# Patient Record
Sex: Male | Born: 1935 | Race: White | Hispanic: No | Smoking: Never smoker
Health system: Southern US, Community
[De-identification: ages and names within clinical notes are randomized; demographics above are authoritative.]

## PROBLEM LIST (undated history)

## (undated) DIAGNOSIS — C61 Malignant neoplasm of prostate: Secondary | ICD-10-CM

## (undated) DIAGNOSIS — R0989 Other specified symptoms and signs involving the circulatory and respiratory systems: Secondary | ICD-10-CM

## (undated) DIAGNOSIS — H9190 Unspecified hearing loss, unspecified ear: Secondary | ICD-10-CM

## (undated) DIAGNOSIS — I779 Disorder of arteries and arterioles, unspecified: Secondary | ICD-10-CM

## (undated) DIAGNOSIS — I10 Essential (primary) hypertension: Secondary | ICD-10-CM

## (undated) DIAGNOSIS — Z951 Presence of aortocoronary bypass graft: Secondary | ICD-10-CM

## (undated) DIAGNOSIS — N201 Calculus of ureter: Secondary | ICD-10-CM

## (undated) DIAGNOSIS — Z85038 Personal history of other malignant neoplasm of large intestine: Secondary | ICD-10-CM

## (undated) DIAGNOSIS — D563 Thalassemia minor: Secondary | ICD-10-CM

## (undated) DIAGNOSIS — N189 Chronic kidney disease, unspecified: Secondary | ICD-10-CM

## (undated) DIAGNOSIS — E785 Hyperlipidemia, unspecified: Secondary | ICD-10-CM

## (undated) DIAGNOSIS — R413 Other amnesia: Secondary | ICD-10-CM

## (undated) DIAGNOSIS — N185 Chronic kidney disease, stage 5: Secondary | ICD-10-CM

## (undated) DIAGNOSIS — Z789 Other specified health status: Secondary | ICD-10-CM

## (undated) DIAGNOSIS — R809 Proteinuria, unspecified: Secondary | ICD-10-CM

## (undated) DIAGNOSIS — C189 Malignant neoplasm of colon, unspecified: Secondary | ICD-10-CM

## (undated) DIAGNOSIS — E872 Acidosis: Secondary | ICD-10-CM

## (undated) DIAGNOSIS — E782 Mixed hyperlipidemia: Secondary | ICD-10-CM

## (undated) DIAGNOSIS — I739 Peripheral vascular disease, unspecified: Secondary | ICD-10-CM

## (undated) DIAGNOSIS — R55 Syncope and collapse: Secondary | ICD-10-CM

## (undated) DIAGNOSIS — N2581 Secondary hyperparathyroidism of renal origin: Secondary | ICD-10-CM

## (undated) DIAGNOSIS — I2510 Atherosclerotic heart disease of native coronary artery without angina pectoris: Secondary | ICD-10-CM

## (undated) DIAGNOSIS — E213 Hyperparathyroidism, unspecified: Secondary | ICD-10-CM

## (undated) DIAGNOSIS — Z8679 Personal history of other diseases of the circulatory system: Secondary | ICD-10-CM

## (undated) DIAGNOSIS — D631 Anemia in chronic kidney disease: Secondary | ICD-10-CM

## (undated) DIAGNOSIS — R42 Dizziness and giddiness: Secondary | ICD-10-CM

## (undated) DIAGNOSIS — I5189 Other ill-defined heart diseases: Secondary | ICD-10-CM

## (undated) DIAGNOSIS — I6529 Occlusion and stenosis of unspecified carotid artery: Secondary | ICD-10-CM

## (undated) DIAGNOSIS — Z9181 History of falling: Secondary | ICD-10-CM

## (undated) DIAGNOSIS — R29898 Other symptoms and signs involving the musculoskeletal system: Secondary | ICD-10-CM

## (undated) DIAGNOSIS — Z955 Presence of coronary angioplasty implant and graft: Secondary | ICD-10-CM

## (undated) DIAGNOSIS — I6930 Unspecified sequelae of cerebral infarction: Secondary | ICD-10-CM

## (undated) DIAGNOSIS — N184 Chronic kidney disease, stage 4 (severe): Secondary | ICD-10-CM

## (undated) HISTORY — PX: BRAIN HEMATOMA EVACUATION: SHX573

## (undated) HISTORY — PX: INGUINAL HERNIA REPAIR: SUR1180

## (undated) HISTORY — DX: Malignant neoplasm of prostate: C61

## (undated) HISTORY — PX: HEMICOLECTOMY: SHX854

## (undated) HISTORY — DX: Essential (primary) hypertension: I10

## (undated) HISTORY — DX: Syncope and collapse: R55

## (undated) HISTORY — PX: CARDIAC CATHETERIZATION: SHX172

## (undated) HISTORY — DX: Occlusion and stenosis of unspecified carotid artery: I65.29

## (undated) HISTORY — PX: CORONARY ARTERY BYPASS GRAFT: SHX141

## (undated) HISTORY — DX: Hyperparathyroidism, unspecified: E21.3

## (undated) HISTORY — PX: COLONOSCOPY: SHX174

## (undated) HISTORY — DX: Chronic kidney disease, unspecified: N18.9

## (undated) HISTORY — DX: Atherosclerotic heart disease of native coronary artery without angina pectoris: I25.10

## (undated) HISTORY — DX: Chronic kidney disease, stage 4 (severe): N18.4

## (undated) HISTORY — DX: Hyperlipidemia, unspecified: E78.5

## (undated) HISTORY — DX: Acidosis: E87.2

## (undated) HISTORY — PX: CORONARY ANGIOPLASTY WITH STENT PLACEMENT: SHX49

## (undated) HISTORY — DX: Other specified symptoms and signs involving the circulatory and respiratory systems: R09.89

## (undated) HISTORY — DX: Malignant neoplasm of colon, unspecified: C18.9

## (undated) HISTORY — DX: Other ill-defined heart diseases: I51.89

## (undated) HISTORY — DX: Proteinuria, unspecified: R80.9

---

## 2001-08-30 ENCOUNTER — Encounter: Payer: Self-pay | Admitting: General Surgery

## 2001-09-04 ENCOUNTER — Ambulatory Visit (HOSPITAL_COMMUNITY): Admission: RE | Admit: 2001-09-04 | Discharge: 2001-09-04 | Payer: Self-pay | Admitting: General Surgery

## 2006-12-19 HISTORY — PX: PARTIAL NEPHRECTOMY: SHX414

## 2006-12-19 HISTORY — PX: COLON SURGERY: SHX602

## 2006-12-19 HISTORY — PX: OTHER SURGICAL HISTORY: SHX169

## 2007-03-20 ENCOUNTER — Ambulatory Visit: Payer: Self-pay | Admitting: Oncology

## 2007-03-29 ENCOUNTER — Ambulatory Visit: Payer: Self-pay | Admitting: Gastroenterology

## 2007-04-02 ENCOUNTER — Ambulatory Visit: Payer: Self-pay | Admitting: Oncology

## 2007-04-19 ENCOUNTER — Ambulatory Visit: Payer: Self-pay | Admitting: Oncology

## 2007-07-07 ENCOUNTER — Ambulatory Visit: Payer: Self-pay

## 2007-07-17 ENCOUNTER — Emergency Department: Payer: Self-pay | Admitting: Emergency Medicine

## 2007-08-20 ENCOUNTER — Other Ambulatory Visit: Payer: Self-pay

## 2007-08-21 ENCOUNTER — Inpatient Hospital Stay: Payer: Self-pay | Admitting: Internal Medicine

## 2008-04-16 ENCOUNTER — Ambulatory Visit: Payer: Self-pay

## 2008-06-18 ENCOUNTER — Ambulatory Visit: Payer: Self-pay | Admitting: Unknown Physician Specialty

## 2008-06-30 ENCOUNTER — Emergency Department: Payer: Self-pay | Admitting: Emergency Medicine

## 2008-11-19 ENCOUNTER — Inpatient Hospital Stay (HOSPITAL_COMMUNITY): Admission: AD | Admit: 2008-11-19 | Discharge: 2008-11-28 | Payer: Self-pay | Admitting: Cardiology

## 2008-11-20 HISTORY — PX: CARDIAC CATHETERIZATION: SHX172

## 2008-11-21 ENCOUNTER — Encounter: Payer: Self-pay | Admitting: Cardiothoracic Surgery

## 2008-11-21 ENCOUNTER — Ambulatory Visit: Payer: Self-pay | Admitting: Oncology

## 2008-11-23 ENCOUNTER — Encounter: Payer: Self-pay | Admitting: Cardiothoracic Surgery

## 2008-11-24 HISTORY — PX: CORONARY ARTERY BYPASS GRAFT: SHX141

## 2008-11-27 ENCOUNTER — Ambulatory Visit: Payer: Self-pay | Admitting: Cardiothoracic Surgery

## 2008-12-25 ENCOUNTER — Ambulatory Visit: Payer: Self-pay | Admitting: Cardiothoracic Surgery

## 2008-12-25 ENCOUNTER — Encounter: Admission: RE | Admit: 2008-12-25 | Discharge: 2008-12-25 | Payer: Self-pay | Admitting: Cardiothoracic Surgery

## 2009-01-15 ENCOUNTER — Encounter: Payer: Self-pay | Admitting: Cardiovascular Disease

## 2009-01-19 ENCOUNTER — Encounter: Payer: Self-pay | Admitting: Cardiovascular Disease

## 2009-02-16 ENCOUNTER — Encounter: Payer: Self-pay | Admitting: Cardiovascular Disease

## 2009-02-27 ENCOUNTER — Emergency Department: Payer: Self-pay | Admitting: Internal Medicine

## 2009-07-02 ENCOUNTER — Ambulatory Visit: Payer: Self-pay | Admitting: Unknown Physician Specialty

## 2009-12-01 ENCOUNTER — Ambulatory Visit: Payer: Self-pay | Admitting: Vascular Surgery

## 2010-06-30 ENCOUNTER — Ambulatory Visit: Payer: Self-pay

## 2011-05-03 NOTE — Assessment & Plan Note (Signed)
OFFICE VISIT   Jake Garcia, Jake Garcia  DOB:  Jan 03, 1929                                        December 25, 2008  CHART #:  [id number]   The patient returns to the office today for followup visit following off-  pump coronary artery bypass grafting x1 with left internal mammary to  the left anterior descending coronary artery.  He had originally  presented with positive stress test and cardiac catheterization with  complex LAD lesion.  After consideration, it was felt not to be suitable  for angioplasty and the patient was referred for coronary artery bypass  grafting which was carried out 11/24/2008.  The patient did well  postoperatively, returns today for a followup visit.  He notes that  overall his strength is returning.  He has increased in his physical  activity appropriately.  He has had no recurrent angina.  He has noted  some mild blurred vision that resolves, that has not been permanent.  He  denies any definite amaurosis and notes that when he puts his glasses  on, it is improved.   PHYSICAL EXAMINATION:  VITAL SIGNS:  His blood pressure 153/78, pulse  76, respiratory rate is 18, and O2 sats 97%.  CHEST:  His sternum is stable and well healed.  He has no pedal edema.  His lungs are clear bilaterally.   CURRENT MEDICATIONS:  1. Aspirin 325 mg a day.  2. Norvasc 5 mg a day.  3. Crestor 20 mg a day.  4. Lopressor 25 mg p.o. b.i.d.   Followup chest x-ray shows clear lung fields bilaterally.   Overall, the patient is making good progress postoperatively.  I have  been encouraged him to enroll in the cardiac rehab program in [facility], as this is close to his home which he is  willing to do.  At this point, he has no gross visual field cut and his  visual symptoms seem to be very transient, but not classic for  amaurosis.  He will make appointment for followup with his  ophthalmologist.  I have not made him a return appointment to  see me as  he is followed by Dr. Rockey Situ in [place]  office.   It should be noted that the patient's preoperative Doppler studies did  have some vascular disease in the right 60-79%, but closer to the 60%  range.  This could be called up through Dr. Rockey Situ in the [place]  office.   Lanelle Bal, MD  Electronically Signed   EG/MEDQ  D:  12/25/2008  T:  12/25/2008  Job:  [id number]   cc:   Minna Merritts, MD

## 2011-05-03 NOTE — Consult Note (Signed)
NAME:  Jake Garcia, Jake Garcia                 ACCOUNT NO.:  [id number]   MEDICAL RECORD NO.:  [id number]          PATIENT TYPE:  INP   LOCATION:                               FACILITY:  [facility]   PHYSICIAN:  Sherryl Manges, MD            DATE OF BIRTH:  [birth year]   DATE OF CONSULTATION:  11/21/2008  DATE OF DISCHARGE:                                 CONSULTATION   Room number 473C, bed 1.   REASON FOR CONSULTATION:  Anemia.   REFERRING PHYSICIAN:  Eden Lathe. Einar Gip, MD   HISTORY OF PRESENT ILLNESS:  Jake Garcia is a delightful 77 year old  white male,  asked to see for evaluation of possible thalassemia.  The  patient carries a diagnosis of stage I colon cancer, status post left  hemicolectomy and s/p partial right nephrectomry for a benign renal  lesion in 2008.  He is followed at [facility].  All the  records have been requested, and they are currently pending.  His son, a  Pension scheme manager, at [facility], states that no radiation or  chemotherapy was received.  The patient presented to Dr. [name] office  with anginal symptoms which required further evaluation in hospital.  He  was admitted for left cardiac catheterization, which was remarkable for  proximal to mid LAD stenosis.  He will require a single-vessel CABG  using lateral thoracotomy - off-pump approach.  While obtaining his  history, he mentioned that 1 of his children has been diagnosed with  thalassemia for which we were asked to see him in consultation prior to  his surgery.  The patient is of [place] descent with family  members from [place], and [place].  He denies any fatigue  or appetite changes.  No bleeding problems.  His main complaint is  dyspnea on exertion, which resolved when the activities discontinued,  accompanied by chest pain when this exertion takes place.  Otherwise, he  denies any fever or chills.  No night sweats or headaches.  No  significant fatigue.  He denies any GI or GU  complaints at this time.  He denies any bleeding in his stools or in his urine.  No neuro changes.  No weight loss.  The peripheral blood smear has been ordered, and he is  ready for review.   PAST MEDICAL HISTORY:  1. History of stage I colon cancer status post left hemicolectomy  2. Chronic renal insufficiency.  3. Hypertension.  4. Right bundle branch block.  5. Hyperlipidemia.   SURGERIES:  1. Status post cardiac catheterization, Dr. Rex Kras, November 20, 2008.  2. Status post left inguinal hernia repair, September 04, 2001, Dr.      Rebekah Chesterfield.  3. Status post right inguinal hernia repair in July 2009 at [facility].  4. Status post partial nephrectomy in the year 2008 with benign      pathology per patient's report.  5. Status post left hemicolectomy on Apr 27, 2007, at [facility].   MEDICATIONS:  1. A 2b-3a protocol as directed.  2. Norvasc 5 mg  p.o. daily.  3. Aspirin 325 mg p.o. daily.  4. Coreg 3.125 mg b.i.d.  5. Valium 5 mg p.o. p.r.n.  6. Integrilin 75 mg IV x1 drip.  7. Crestor 20 mg nightly.  8. Tylenol p.r.n.  9. Xanax 0.25 mg b.i.d. p.r.n.   The following are p.r.n. medications, Robitussin, Zofran, Imodium,  Percocet, Maalox Tucks, morphine sulfate, and Ambien.   REVIEW OF SYSTEMS:  See HPI for significant positives.  The rest of the  review of systems is essentially negative.   FAMILY HISTORY:  Mother died with heart disease in her 10s.  Father died  in his [id number] with complications after a fracture.  He also had a prostate  disease, but per the patient report he cannot tell if he did have benign  or malignant prostatic disease.  One sister died with liver cancer, 1  died with mad cow disease.  Another sister died while on surgery, as she  was undergoing a biopsy for a cervical lymph node of unknown etiology.  No brothers.   SOCIAL HISTORY:  The patient is married.  He has 5 children, 1 with  thalassemia, his daughter.  The other children have not been tested.   No  tobacco or alcohol history.  The patient lives in [place].  Originally,  they are from [place].  Self employed.   HEALTH MAINTENANCE:  His last colonoscopy was in May 2009 at [place],  Dr. Vira Agar.  His last flu and Pneumovax were administered on November 20, 2008.  He is up to date with his physical exam.   PHYSICAL EXAMINATION:  GENERAL:  This is a well-developed, well-  nourished 77 year old white male, in no acute distress, alert and  oriented x3.  VITAL SIGNS:  Blood pressure 130/62, pulse 64, respirations 20,  temperature 97.9, pulse oximetry 96% in room air, weight 78 kg, height  68 inches.  HEENT:  Normocephalic, atraumatic.  PERRLA.  Oral cavity without lesions  or thrush.  NECK:  Supple.  No cervical or supraclavicular masses.  LUNGS:  Clear to auscultation bilaterally.  No axillary masses.  CARDIOVASCULAR:  Regular rate and rhythm with [id number] systolic murmur,  best heard in the left sternal border towards the axillary area.  No  rubs or gallops.  ABDOMEN:  Soft, nontender.  Bowel sounds x4.  No hepatosplenomegaly.  There is a presence of well-healed abdominal scar.  EXTREMITIES:  No clubbing or cyanosis.  No edema.  No inguinal masses.  SKIN:  Without bruising or petechial rash.  MUSCULOSKELETAL:  Without spinal tenderness.  NEUROLOGIC:  Nonfocal.   LABORATORY DATA:  Hemoglobin 9, hematocrit 28.6, white count 7.7,  platelets 204, MCV 65.1, was 66 on admission.  The differential is not  available at this time.  PTT is 30.  There is no PT or INR information.  Sodium 139; potassium 3.9; BUN 22; creatinine 1.93, was 1.75 on  admission; glucose 93.  Total bilirubin 0.8, alkaline phosphatase 64,  AST 19, ALT 14, total protein 7.3, albumin 4.2.  Calcium 9.1.  TSH  1.466.   I personally reviewed his peripheral blood smear which showed no  evidence of target cell, schistocytes, dacrocytes, agglutination,  reticulocytosis.  WBC and plt morphologies were within  normal limit.   ASSESSMENT/PLAN:  Jake Garcia is a pleasant 77 year old man now with  coronary artery disease awaiting CABG for LAD lesion.  He is  incidentally found to have normocytic anemia.   Ddx for his normocytic anemia include anemia of  chronic kidney disease.  Unlikely that there is no family history of thalessimia except for his  daughter.  If he were to have thalesimia, it would be only trait as  opposed to frank disease as he did not require blood transfuion.  Other  ddx includes plasma cell dyscrasia given his CKD and anemia.  There was  no evidence of RBC destruction on peripheral blood smear.   REC:   - No contraindication to proceeding his cardiac surgery while awaiting  his anemia workup.  - Routine anemia panel with iron study, folate, Vit B12, retic count,  - Hemoglobin electrophoresis.  - SPEP/ serum free light chains.  - 24-hour urine collection to  assess his renal function.      Sharene Butters, P.A.      Sherryl Manges, MD  Electronically Signed    SW/MEDQ  D:  11/21/2008  T:  11/21/2008  Job:  [id number]   cc:   Laurian Brim  Dr. Mirna Mires

## 2011-05-03 NOTE — Discharge Summary (Signed)
NAME:  Jake Garcia, Jake Garcia                 ACCOUNT NO.:  [id number]   MEDICAL RECORD NO.:  [id number]          PATIENT TYPE:  INP   LOCATION:  2041                         FACILITY:  [facility]   PHYSICIAN:  Lanelle Bal, MD    DATE OF BIRTH:  1929/10/12   DATE OF ADMISSION:  11/19/2008  DATE OF DISCHARGE:  11/28/2008                               DISCHARGE SUMMARY   HISTORY OF PRESENT ILLNESS:  This is a 77 year old male with a past  medical history of hypertension, hyperlipidemia, and chronic renal  insufficiency who over the past 2 weeks began experiencing chest pain  with exertion.  He first experienced a chest pain when he was walking in  the park and stated over the past 2 weeks he has had several episodes  with a chest pain that develops with exertion and then resolves when he  rests.  Associated with this chest pain was some shortness of breath and  redness in the face.  However, he denied any diaphoresis,  nauseousness, vomiting, syncope, or radiation of the chest pain.  He was  evaluated in the [facility] Cardiology office this past Monday.  Doppler studies were done.  The patient then had a stress test on  Wednesday.  Because he had worsening of the chest pain, he was admitted  for further evaluation.  He underwent a cardiac catheterization on  November 20, 2008, which revealed single-vessel coronary artery disease.  A Cardiothoracic Surgery consultation was obtained with Dr. Servando Snare and  in addition because of the patient's anemia, a Hematology consult was  obtained with Dr. Lamonte Sakai.  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  There was no contraindication to proceed with Cardiac Surgery  and the patient underwent coronary bypass grafting surgery x1 off pump  by Dr. Servando Snare on November 24, 2008.   BRIEF HOSPITAL COURSE STAY:  The patient was extubated on the evening of  surgery.  He was anemic.  He did  receive a unit of packed red blood  cells on November 25, 2008.  His creatinine was 1.73 (the patient with a  history of renal insufficiency).  Creatinine and H and H were monitored  closely over the next couple of days.  Chest tubes were removed by  November 26, 2008.  Followup chest x-ray revealed no pneumothorax.  The  patient was transferred from the Intensive Care Unit to [facility] for further  convalescence.  The patient had already been started on Lopressor.  This  had to be increased because of increased systolic blood pressure.  The  patient was progressing well with cardiac rehab.  Currently on postop  day #4, the patient is tolerating a diet.  He has had multiple bowel  movements.   PHYSICAL EXAMINATION:  VITAL SIGNS:  He is afebrile, heart rate 70-80,  BP 187/95 (previously 152/73), O2 sat 96% on room air.  CBG 163, 97, and  199.  Preop weight 81.3 kg, today's weight down to 78.3 kg.  CARDIOVASCULAR:  Regular rate and  rhythm.  PULMONARY:  Clear, diminished at the right base.  ABDOMEN:  Soft, nontender.  Bowel sounds present throughout.  EXTREMITIES:  No edema.  Sternal wound is clean, dry, and continuing to  heal.   Because of the patient's increased systolic blood pressure Norvasc 5 mg  was added (the patient on Norvasc 10 mg p.o. daily preop).  The patient  was discharged after his blood pressure did improve.  Prior to his  discharge, pacing wires were removed as well as chest tube sutures.   LATEST LABORATORY STUDIES:  BMET done today revealed the potassium of  3.8, this was supplemented prior to his discharge.  BUN and creatinine  were 23 and 2.01 respectively.  CBC done today revealed H and H to be  10.4 and 33.4 respectively, white count 9600, and platelet count  290,000.  Last chest x-ray done November 27, 2008, showed improved  aeration in left lung base, slightly increasing mid pleural effusion,  and bibasilar atelectasis.   DISCHARGE INSTRUCTIONS:  The patient is to  be on a low-fat, low-salt,  carbohydrate-modified medium caloric diet.  He is to not drive or lift  more than 10 pounds.  He is to continue with his breathing exercises  daily.  He is to walk every day and increase his frequency and duration  as he tolerates.  He may shower.  He may cleanse the wound with mild  soap and water.  He is to call the office if any wound problems arise.   FOLLOWUP APPOINTMENTS:  1. The patient is to call for follow up with [facility] Hematology/Oncology.  2. The patient has to follow up with Dr. __________ for the renal      insufficiency.  3. The patient needs to call for followup appoint with Dr. Rockey Situ for      2 weeks.  4. The patient has an appointment to see Dr. Servando Snare on December 25, 2008, at 2 p.m.  Prior to this office appointment, a chest x-ray      will be obtained.   DISCHARGE MEDICATIONS:  1. Enteric-coated aspirin 325 mg p.o. daily.  2. Norvasc 5 mg p.o. daily.  3. Crestor 20 mg p.o. daily.  4. Lopressor 25 mg p.o. 2 times daily.  5. Oxycodone 5 mg 1-2 tablets q.4-6 h. as needed for pain.      Lars Pinks, [place]      Lanelle Bal, MD  Electronically Signed    DZ/MEDQ  D:  11/28/2008  T:  11/29/2008  Job:  [id number]   cc:   Minna Merritts, MD  Cheryll Cockayne, M.D.  Eden Lathe. Einar Gip, MD

## 2011-05-03 NOTE — Cardiovascular Report (Signed)
NAME:  Jake Garcia, Jake Garcia                 ACCOUNT NO.:  [id number]   MEDICAL RECORD NO.:  [id number]          PATIENT TYPE:  INP   LOCATION:  4736                         FACILITY:  [facility]   PHYSICIAN:  Jeanella Craze. Little, M.D. DATE OF BIRTH:  May 29, 1929   DATE OF PROCEDURE:  11/20/2008  DATE OF DISCHARGE:                            CARDIAC CATHETERIZATION   This 77 year old male presented to the office for a nuclear study.  He  had ST depression in the anterior lateral leads associated with nausea.  The test was terminated.  He was admitted to the hospital and was  brought to the cath lab today.   He has been having some exertional angina dating off and on for 4 or 5  weeks.  He is pretty much in denial.   PROCEDURE:  The patient was prepped and draped in the usual sterile  fashion exposing the right groin.  Following local anesthetic with 1%  Xylocaine, the Seldinger technique was employed and a 5-French  introducer sheath was placed in the right femoral artery.  Left and  right coronary arteriography and a hand injection of the left ventricle  was performed.   COMPLICATIONS:  None.   TOTAL CONTRAST USED:  80 mL.   EQUIPMENT:  A 5-French Judkins configuration catheters.   It should be pointed out that he had a creatinine of 1.8 prior to the  procedure.  He had had a partial nephrectomy in the remote past also.   RESULTS:  Hemodynamic monitoring.  The central aortic pressure is  130/80.  His left ventricular pressure was 132/8.   Ventriculography:  Ventriculography by hand injection in the RAO  projection revealed normal LV function.  EDP was 18.   CORONARY ARTERIOGRAPHY:  1. Left main and normal, it trifurcated.  2. Circumflex.  The circumflex had no significant disease.  It gave      rise to a bifurcating OM #1 and a smaller OM #2 both of which were      free of disease.  3. Optional diagonal, moderate-sized vessel with no high-grade      stenoses.  4. LAD.  The LAD had a  complex area of stenosis that started near the      ostium of the LAD and extended down about 30 mm with the most      significant area being about 90%.  It was unclear whether this      represented an ulcerated plaque or not, but the entire segment was      diffusely diseased.  In addition to this, slightly distal to this      area was an eccentric area of 70% narrowing and in the distal      portion of the LAD was another 50% area of narrowing.  The first      diagonal was small and diffusely diseased, and not amenable to any      type of intervention.   Optional diagonal.  This was a large vessel with bifurcation that had no  significant disease.  1. Right coronary artery.  This is a very  large dominant vessel.  The      mid and distal RCA had diffuse mild irregularities.  The PDA had      mild irregularities and 2 posterior lateral vessels were free of      disease.   CONCLUSION:  1. Normal left ventricular function.  2. High-grade complex stenosis in the left anterior descending.  The      options at this point include single-vessel off-pump coronary      artery bypass grafting, but with no other disease.  He would      probably benefit from a complex multiple stent intervention to his      left anterior descending.  I have stopped at this point because of      his creatinine, we will hydrate him overnight.  Check his renal      functions in the morning.  If they are around the same, we will      proceed on with intervention.  I did start him on IV Integrilin,      but I did not start Plavix.  In case, there are any issues with the      intervention, I did not want Plavix to be a reason for him not to      be taken to the emergency room on an urgent basis if necessary.           ______________________________  Jeanella Craze Little, M.D.     ABL/MEDQ  D:  11/20/2008  T:  11/21/2008  Job:  [id number]   cc:   Minna Merritts, MD  Eden Lathe Einar Gip, MD  Catheterization Laboratory

## 2011-05-03 NOTE — Discharge Summary (Signed)
NAME:  Jake Garcia, Jake Garcia                 ACCOUNT NO.:  [id number]   MEDICAL RECORD NO.:  [id number]          PATIENT TYPE:  INP   LOCATION:  2041                         FACILITY:  [facility]   PHYSICIAN:  Eden Lathe. Einar Gip, MD       DATE OF BIRTH:  [birth year]   DATE OF ADMISSION:  11/19/2008  DATE OF DISCHARGE:  11/28/2008                               DISCHARGE SUMMARY   ADMITTING DIAGNOSES:  1. Single-vessel coronary artery disease.  2. History of hypertension.  3. History of hyperlipidemia.  4. History of chronic renal insufficiency.  5. History of colon cancer.  6. Thalassemia, B trait (asymptomatic).  7. Anemia of chronic kidney disease.   DISCHARGE DIAGNOSES:  1. Single-vessel coronary disease.  2. History of hypertension.  3. History of hyperlipidemia.  4. History of chronic renal insufficiency.  5. History of colon cancer.  6. Thalassemia, B trait (asymptomatic).  7. Anemia of chronic kidney disease.   PROCEDURES:  1. Cardiac catheterization, done on November 20, 2008, by Dr. Rex Kras,      results showed preserved left ventricular function, high-grade      complex stenosis of the left anterior descending.  2. Coronary artery bypass grafting surgery x1 (left internal mammary      artery to left anterior descending) off pump by Dr. Servando Snare on      November 24, 2008.   HISTORY OF PRESENT ILLNESS:  This is a 77 year old male with past  medical history of hypertension, hyperlipidemia, chronic renal  insufficiency who according    Dictation ended at this point.      Lars Pinks, PA      Brooks R. Einar Gip, MD  Electronically Signed    DZ/MEDQ  D:  11/28/2008  T:  11/29/2008  Job:  [id number]

## 2011-05-03 NOTE — Op Note (Signed)
Jake Garcia, Jake Garcia                 ACCOUNT NO.:  [id number]   MEDICAL RECORD NO.:  [id number]          PATIENT TYPE:  INP   LOCATION:  2303                         FACILITY:  [facility]   PHYSICIAN:  Lanelle Bal, MD    DATE OF BIRTH:  [birth year]   DATE OF PROCEDURE:  11/24/2008  DATE OF DISCHARGE:                               OPERATIVE REPORT   PROCEDURE:  Coronary artery bypass grafting off pump x1 with left  internal mammary to left anterior descending coronary artery.   PREOPERATIVE DIAGNOSIS:  Coronary occlusive disease.   POSTOPERATIVE DIAGNOSIS:  Coronary occlusive disease.   SURGEON:  Lanelle Bal, MD   FIRST ASSISTANT:  Revonda Standard. Roxan Hockey, MD.   BRIEF HISTORY:  The patient is a 77 year old male who presents with  rapidly progressing unstable anginal symptoms and a complex proximal LAD  lesion and a mid LAD lesion.  Coronary artery bypass grafting with the  mammary was recommended to the patient who agreed and signed informed  consent.   DESCRIPTION OF PROCEDURE:  The patient underwent general endotracheal  anesthesia without incident.  Skin of the chest and legs was prepped  with Betadine and draped in the usual sterile manner.  Median sternotomy  was performed.  Left internal mammary artery was dissected down as  pedicle graft.  The distal artery was divided and had good free flow.  The patient had been systemically heparinized.  The pericardium was  opened.  Overall ventricular function appeared preserved.  Using Guidant  stabilization device, the apex of the heart was elevated.  Between the  mid and distal area LAD, there was an area of calcification just distal  to this.  This vessel was of good size and quality and was selected as  the site of anastomosis.  Using the elastic tapes were placed around the  LAD proximally and distally to this area with the stabilization device  in place.  The LAD was opened.  Elastic tapes were placed for proximal  and distal  control of bleeding.  The vessel was opened using running 8-0  Prolene.  Left internal mammary artery was anastomosed to left anterior  descending coronary artery with release of the bulldog on the mammary  artery.  The blood flow was returned down the mammary artery.  The  elastic tapes were removed and the anastomosis was hemostatic.  There  was good Doppler signal.  The fascia was tacked to the epicardium.  The  retractors were removed.  Protamine was administered.  Atrial and  ventricular pacing wires were applied.  The patient remained  hemodynamically stable throughout the procedure.  Blood loss was  approximately 100 mL.  The pericardium was reapproximated.  Left pleural  tube and Blake mediastinal drain were left in place.  Sternum was closed  with #6 stainless steel wire.  Fascia closed with interrupted 0 Vicryl,  running 3-0 Vicryl in subcutaneous  tissue, 4-0 subcuticular stitch in the skin edges.  Dry dressings were  applied.  Sponge and needle counts were reported as correct at the  completion of the procedure.  The patient  tolerated the procedure  without obvious complication and was transferred to the Surgical  Intensive Care Unit for further postoperative care.      Lanelle Bal, MD  Electronically Signed     EG/MEDQ  D:  11/24/2008  T:  11/25/2008  Job:  [id number]   cc:   Eden Lathe. Einar Gip, MD  Laurian Brim

## 2011-05-03 NOTE — Consult Note (Signed)
NAME:  Jake Garcia, Jake Garcia                 ACCOUNT NO.:  [id number]   MEDICAL RECORD NO.:  [id number]          PATIENT TYPE:  INP   LOCATION:  [id number]                         FACILITY:  [facility]   PHYSICIAN:  Lanelle Bal, MD    DATE OF BIRTH:  August 16, 1929   DATE OF CONSULTATION:  11/21/2008  DATE OF DISCHARGE:                                 CONSULTATION   REQUESTING PHYSICIAN:  Ulice Dash R. Einar Gip, MD   FOLLOWUP CARDIOLOGISTJeanella Craze. Little, M.D.   PRIMARY CARE:  Dr. Laurian Brim.   REASON FOR CONSULTATION:  Coronary artery disease.   HISTORY OF PRESENT ILLNESS:  The patient is a 77 year old white male who  on a regular basis had been walking in a park near his house in [place]  in [place], when he began developing chest pressure and becoming red  in the face.  The symptoms started about 2 weeks ago.  He denies any  diaphoresis or nausea.  Denies syncope, does have associated substernal  chest pressure with the symptoms of exertional shortness of breath.  Because of the symptoms, Monday of this week, he was seen in  [place] office, Doppler studies were done and then the patient  returned on Wednesday and had a stress test.  The patient had severe  pain worse than he had been having and was admitted.  Yesterday, he  underwent cardiac catheterization and was found to have luminal  irregularities in the right coronary artery and circumflex system, but a  high-grade complex proximal LAD lesion.  He was scheduled for  angioplasty this morning, but it was decided to put this off and have a  surgical consultation instead.  Since admission, the patient's  creatinine has risen slightly to 1.8, though it is known to be  chronically elevated.  His hemoglobin dropped from 11 to 9.  The  patient's cardiac risk factors include hypertension, hyperlipidemia  untreated.  He denies diabetes.  He is not a smoker.  He has had no  previous stroke, does have claudication involving the right leg.  The  baseline creatinine has been as high as 2.2, 1.7 on admission.   PAST MEDICAL HISTORY:  Hypertension, chronic renal insufficiency, status  post partial nephrectomy in 2008 for benign lesion.   PREVIOUS SURGERIES:  Cardiac catheterization on November 20, 2008, in  2002 left inguinal hernia repair, partial nephrectomy on the right in  2008 for a benign lesion, left hemicolectomy for a stage I colon cancer  with 18 negative nodes by Dr. Morton Stall in [facility] 2008.  He had a repair  of a right flank incisional hernia from the nephrectomy repaired in  2009.   SOCIAL HISTORY:  The patient is divorced, has 5 children, 8  grandchildren, 1 great grandchild.  Father lived until the 82s and died  after falling in hospital breaking his hip.  Mother had myocardial  infarction in her 49s.  The chart raised some issue of thalassemia.  He  is unclear if there is any diagnosis of thalassemia in any of his  children.  Hematology consult has been obtained.  MEDICATIONS:  Currently are aspirin 325, Crestor 20 mg a day, Norvasc 5  mg a day, Coreg 3.125 mg b.i.d., and Imdur 30 mg a day.  Prior to  admission, he was not on Crestor.  He was only on Norvasc.   DRUG ALLERGIES:  None known.   CARDIAC REVIEW OF SYSTEMS:  Positive for chest pain and exertional  shortness of breath.  He denies any rest pain, though his stress test  was very markedly positive very early.  Denies resting shortness of  breath.  Denies lower extremity edema, palpitation, syncope, presyncope,  or orthopnea.   GENERAL REVIEW OF SYSTEMS:  He denies fever, chills, or night sweats.  Has no constitutional symptoms.  Denies hemoptysis.  Has not had TIAs or  amaurosis.  Denies any particular joint difficulties.  Denies difficulty  urinating.  He has had no blood in his urine or stool.  He did have a  colonoscopy in the summer, was told it was clear.  Does have right leg  claudication, though he noticed since his chest started hurting, the   claudications improved.  Other review of systems are negative.   PHYSICAL EXAMINATION:  VITAL SIGNS:  Blood pressure is 130/62, pulse is  64, respiratory rate is 20, and temperature is 97.9.  He is 5 feet 8  inches tall, 174 pounds.  GENERAL:  The patient is awake, alert, and neurologically intact.  He  does have some difficulty hearing.  LUNGS:  Clear bilaterally.  His chart notes carotid bruit.  I do not  appreciate any carotid bruit on my exam.  CARDIAC:  Regular rate and rhythm with early systolic murmur that is  2/6.  ABDOMEN:  Benign.  Abdominal midline incision and the right flank  incision are both well healed.  He has a dressing in the right groin,  but does not appear to have any significant hematoma in the thigh or  tenderness in the inguinal area suggestive of bleed from his cath site.  EXTREMITIES:  The lower extremities have 1+ DP and PT pulses  bilaterally.  He appears to have adequate vein for bypass bilaterally.   The patient's cath films are reviewed and I am in agreement with Dr.  Rex Kras.  There is a complex proximal LAD lesion of 95%.  There is a mid  LAD lesion of 70%.  The distal vessel appears adequate for bypass.  The  circumflex has no significant disease.  The right coronary artery is a  large dominant vessel with diffuse ectatic changes, but no significant  stenosis.   IMPRESSION:  The patient with complex left anterior descending coronary  artery lesion with rising creatinine and falling hemoglobin of unknown  cause and question of thalassemia.  I have discussed the case with Dr.  Einar Gip.  He is concerned about drug-eluting stent and then committing the  patient to long-term Plavix therapy.  After reviewing the films, the  lesion appears to be a complex lesion that would require multiple and  long areas of stenting.  I discussed the possibility of coronary artery  bypass grafting ideally with the mammary artery to the patient including  the risks of death,  infection, stroke, myocardial infarction, bleeding,  blood transfusion.  The patient at this point is willing to proceed with  bypass surgery.  We will watch his hemoglobin and creatinine over the  next 48 hours and currently tentatively scheduled for surgery on  Tuesday, November 25, 2008.      Lanelle Bal,  MD  Electronically Signed     EG/MEDQ  D:  11/21/2008  T:  11/22/2008  Job:  [id number]   cc:   Rexene Edison R. Einar Gip, MD

## 2011-05-06 NOTE — Op Note (Signed)
[facility]  Patient:    Jake Garcia, Jake Garcia Visit Number: [id number] MRN: [id number]          Service Type: DSU Location: DAY Attending Physician:  Harlin Rain Proc. Date: 09/04/01 Admit Date:  09/04/2001                             Operative Report  OPERATIVE PROCEDURE:  Left inguinal hernia repair with mesh.  Preperitoneal and onlay Prolene.  SURGEON:  Shellia Carwin, M.D.  ANESTHESIA:  General.  PREOPERATIVE DIAGNOSIS:  Left inguinal hernia.  POSTOPERATIVE DIAGNOSIS:  Left inguinal hernia - combined direct and indirect.  CLINICAL SUMMARY:  A 77 year old male had inguinal hernia repair by myself approximately 12 years ago.  That side remains strong on the right.  However, on the left side, he has developed a bulge and on physical examination, has a large hernia.  It is symptomatic, and he comes in for repair.  OPERATIVE FINDINGS:  The patient had a combined medium to large direct and indirect inguinal hernia.  The cord contents and the nerves are identified and not injured.  OPERATIVE PROCEDURE:  Under satisfactory general endotracheal anesthesia, having received 1.0 gram Ancef preop, the patients abdomen and genitalia were prepped and draped in the standard fashion.  A transverse left lower abdominal incision was made and carried down to and through the external ring and external oblique.  Bleeders were coagulated or tied with 3-0 Vicryl. Self-retaining retractors gave excellent exposure, and the cord and its contents were mobilized with a Penrose drain.  Indirect sac identified, dissected high, and twisted.  It was controlled at the neck with a 0 Prolene suture ligature and excess sac cut away and the ends of the suture left long for marking purposes.  Then, the floor of the canal was opened over the direct hernia into the preperitoneal space from the pubis to the internal ring. Finger dissection was used to reduce the preperitoneal  fat, and this was held away with a moist gauze.  One-third of a 3 x 6 inch piece of Prolene mesh was tailored into an oval and was anchored at BJ's ligament with a 0 Prolene suture.  It was then unfolded medially and inferiorly.  The gauze was removed and the mesh then unfolded superiorly and the mesh held to the undersurface of the abdominal wall with a single 0 Prolene suture.  Following this, the floor was approximated over the mesh with a running 0 Prolene suture, taking intermittent bites of the mesh and when tied, the ends of the suture left long at the internal ring which was snug.  The remainder of the mesh was tailored into an oval with a slit to go around the cord structures.  It was anchored at the internal ring to that suture and then tacked around the periphery under slight tension with interrupted 0 Prolene suture to the inguinal ligament below and the internal oblique above.  The tails were then sutured to each other and the fascia above and lateral to the cord.  The wound was then lavaged with saline, infiltrated with Marcaine for postop analgesia, and closed in layers.  Running 2-0 Vicryl approximated the external oblique, interrupted 2-0 Vicryl for the deep fascia, 3-0 Vicryl for subcu, Steri-Strips for skin.  Sterile dressings were applied, and the patient went to the recovery room from the operating room in good condition without complication. Attending Physician:  Harlin Rain  DD:  09/04/01 TD:  09/04/01 Job: [id number]

## 2011-08-05 ENCOUNTER — Ambulatory Visit (INDEPENDENT_AMBULATORY_CARE_PROVIDER_SITE_OTHER): Payer: Medicare Other | Admitting: Cardiovascular Disease

## 2011-08-05 ENCOUNTER — Encounter: Payer: Self-pay | Admitting: Cardiovascular Disease

## 2011-08-05 DIAGNOSIS — I2510 Atherosclerotic heart disease of native coronary artery without angina pectoris: Secondary | ICD-10-CM | POA: Insufficient documentation

## 2011-08-05 DIAGNOSIS — E785 Hyperlipidemia, unspecified: Secondary | ICD-10-CM | POA: Insufficient documentation

## 2011-08-05 DIAGNOSIS — I10 Essential (primary) hypertension: Secondary | ICD-10-CM

## 2011-08-05 DIAGNOSIS — R0789 Other chest pain: Secondary | ICD-10-CM

## 2011-08-05 DIAGNOSIS — Z951 Presence of aortocoronary bypass graft: Secondary | ICD-10-CM

## 2011-08-05 NOTE — Assessment & Plan Note (Signed)
No further testing at this time. H/o LIMA to the LAD.

## 2011-08-05 NOTE — Assessment & Plan Note (Signed)
Chest discomfort is atypical in nature. We will watch it for now. No symptoms with exertion.

## 2011-08-05 NOTE — Progress Notes (Addendum)
Patient ID: NICOLUS METCALFE, male    DOB: 20-Aug-1929, 77 y.o.   MRN: [id number]  HPI Comments: Mr. Hunton is a pleasant 77 year old gentleman with a history of coronary artery disease, bypass surgery in December 2009 at [facility], high-grade complex stenosis of the LAD with a LIMA to the LAD which was off-pump, hyperlipidemia, hypertension who presents to establish care. He was last seen by myself in January 2010 at [facility].   He reports having significant belching when he lays down flat. He also reports having some chest pressure when he was lying flat on his stomach trying to go to sleep. No chest pressure with exertion. Otherwise he has been active with no complaints. He did take himself off his cholesterol medication secondary to groin pain.  No smoking history. He does not exercise.  Last echocardiogram in December 2009 showed mild LVH, diastolic dysfunction, mild TR, mildly elevated right ventricular systolic pressures.  Stress test in December 2009 showed ischemia in the anterior, septal and apical walls  Cardiac catheterization December 2000 Would not a percent proximal LAD disease, followed by 70%, 50% narrowing, small diseased D1, mild disease of the RCA  EKG shows normal sinus rhythm with rate 67 beats per minute with T wave abnormality in V3 to V6, 2, 3, aVF   Outpatient Encounter Prescriptions as of 08/05/2011  Medication Sig Dispense Refill  . amLODipine (NORVASC) 10 MG tablet Take 10 mg by mouth daily.        [name] aspirin 325 MG tablet 1 tab prn. Pt states he does not take every day       . metoprolol succinate (TOPROL-XL) 25 MG 24 hr tablet Take 25 mg by mouth daily.        [name] DISCONTD: rosuvastatin (CRESTOR) 20 MG tablet Take 20 mg by mouth daily.           Review of Systems  Constitutional: Negative.   HENT: Negative.   Eyes: Negative.   Respiratory: Negative.   Cardiovascular: Negative.        Chest discomfort when lying on his stomach.  Gastrointestinal: Negative.     Belching.  Musculoskeletal: Negative.   Skin: Negative.   Neurological: Negative.   Hematological: Negative.   Psychiatric/Behavioral: Negative.   All other systems reviewed and are negative.    BP 153/77  Pulse 76  Resp 12  Ht 5\' 8"  (1.727 m)  Wt 179 lb (81.194 kg)  BMI 27.22 kg/m2  Physical Exam  Nursing note and vitals reviewed. Constitutional: He is oriented to person, place, and time. He appears well-developed and well-nourished.  HENT:  Head: Normocephalic.  Nose: Nose normal.  Mouth/Throat: Oropharynx is clear and moist.  Eyes: Conjunctivae are normal. Pupils are equal, round, and reactive to light.  Neck: Normal range of motion. Neck supple. No JVD present.  Cardiovascular: Normal rate, regular rhythm, S1 normal, S2 normal, normal heart sounds and intact distal pulses.  Exam reveals no gallop and no friction rub.   No murmur heard. Pulmonary/Chest: Effort normal and breath sounds normal. No respiratory distress. He has no wheezes. He has no rales. He exhibits no tenderness.  Abdominal: Soft. Bowel sounds are normal. He exhibits no distension. There is no tenderness.  Musculoskeletal: Normal range of motion. He exhibits no edema and no tenderness.  Lymphadenopathy:    He has no cervical adenopathy.  Neurological: He is alert and oriented to person, place, and time. Coordination normal.  Skin: Skin is warm and dry. No rash noted.  No erythema.  Psychiatric: He has a normal mood and affect. His behavior is normal. Judgment and thought content normal.           Assessment and Plan

## 2011-08-05 NOTE — Assessment & Plan Note (Signed)
We will check a cholesterol early next week. He will likely need a cholesterol medication. He was on Crestor and stopped this secondary to groin pain.

## 2011-08-05 NOTE — Patient Instructions (Signed)
You are doing well. No medication changes were made. Please come in for a blood draw next week. Please call us if you have new issues that need to be addressed before your next appt.  We will call you for a follow up Appt. In 6 months

## 2011-08-05 NOTE — Assessment & Plan Note (Signed)
Blood pressure at home is well controlled (per the patient). We will continue him on his current medication regimen

## 2011-08-08 ENCOUNTER — Other Ambulatory Visit (INDEPENDENT_AMBULATORY_CARE_PROVIDER_SITE_OTHER): Payer: Medicare Other | Admitting: *Deleted

## 2011-08-08 DIAGNOSIS — E785 Hyperlipidemia, unspecified: Secondary | ICD-10-CM

## 2011-08-09 LAB — LIPID PANEL
HDL: 30 mg/dL — ABNORMAL LOW (ref >39)
LDL Cholesterol: 137 mg/dL — ABNORMAL HIGH (ref 0–99)
Total CHOL/HDL Ratio: 6.4 Ratio
Triglycerides: 119 mg/dL (ref <150)
VLDL: 24 mg/dL (ref 0–40)

## 2011-08-09 LAB — HEPATIC FUNCTION PANEL
Albumin: 4.4 g/dL (ref 3.5–5.2)
Total Bilirubin: 0.8 mg/dL (ref 0.3–1.2)
Total Protein: 7.3 g/dL (ref 6.0–8.3)

## 2011-08-10 ENCOUNTER — Encounter: Payer: Self-pay | Admitting: Cardiovascular Disease

## 2011-08-15 ENCOUNTER — Ambulatory Visit (INDEPENDENT_AMBULATORY_CARE_PROVIDER_SITE_OTHER): Payer: Medicare Other | Admitting: Cardiovascular Disease

## 2011-08-15 ENCOUNTER — Encounter: Payer: Self-pay | Admitting: *Deleted

## 2011-08-15 ENCOUNTER — Encounter: Payer: Self-pay | Admitting: Cardiovascular Disease

## 2011-08-15 DIAGNOSIS — E785 Hyperlipidemia, unspecified: Secondary | ICD-10-CM

## 2011-08-15 DIAGNOSIS — R0602 Shortness of breath: Secondary | ICD-10-CM

## 2011-08-15 DIAGNOSIS — R0789 Other chest pain: Secondary | ICD-10-CM

## 2011-08-15 DIAGNOSIS — I2510 Atherosclerotic heart disease of native coronary artery without angina pectoris: Secondary | ICD-10-CM

## 2011-08-15 MED ORDER — ATORVASTATIN CALCIUM 20 MG PO TABS: 10.0000 mg | ORAL_TABLET | Freq: Every day | ORAL | Status: DC

## 2011-08-15 MED ORDER — NITROGLYCERIN 0.4 MG SL SUBL: 0.4000 mg | SUBLINGUAL_TABLET | SUBLINGUAL | Status: DC | PRN

## 2011-08-15 NOTE — Progress Notes (Signed)
Jake Garcia Date of Birth  26-Jul-1929 [facility] / [facility] [id number] [place], [place]  [address] [phone]  Fax  [phone]  History of Present Illness:  77 year old gentleman with a history of coronary artery disease.  Has a history of coronary artery bypass grafting and was seen by Dr. Rockey Situ several weeks ago. His symptoms have worsened somewhat since that time.  The symptoms typically occur at rest. The symptoms start with an feeling of indigestion and lots of belching. He then developes some chest discomfort and shortness of breath.  He's lying down at the time he typically has to get up and walk around little bit until the symptoms are relieved.  He's very vague about his symptoms. It was difficult to get him to describe them specifically.  Patient does not exercise. He walks on occasion.  He had the same sort of chest pressure with exertion this past Saturday when he was walking into [employer].  He had to sit down and rest for a few minutes to catch his breath.  He had his bypass grafting in 2009.  Current Outpatient Prescriptions on File Prior to Visit  Medication Sig Dispense Refill  . amLODipine (NORVASC) 10 MG tablet Take 10 mg by mouth daily.        [name] aspirin 325 MG tablet 1 tab prn. Pt states he does not take every day       . metoprolol succinate (TOPROL-XL) 25 MG 24 hr tablet Take 25 mg by mouth daily.          No Known Allergies  Past Medical History  Diagnosis Date  . Coronary artery disease   . Hyperlipidemia   . Hypertension     Past Surgical History  Procedure Date  . Coronary artery bypass graft 11/24/2008  . Cardiac catheterization 11/20/2008    History  Smoking status  . Never Smoker   Smokeless tobacco  . Not on file    History  Alcohol Use No    History reviewed. No pertinent family history.  Reviw of Systems:  Reviewed in the HPI.  All other systems are negative.  Physical Exam: BP  154/82  Pulse 74  Ht 5\' 8"  (1.727 m)  Wt 178 lb (80.74 kg)  BMI 27.06 kg/m2 The patient is alert and oriented x 3.  The mood and affect are normal.   Skin: warm and dry.  Color is normal.    HEENT:   the sclera are nonicteric.  The mucous membranes are moist.  The carotids are 2+ without bruits.  There is no thyromegaly.  There is no JVD.    Lungs: clear.  The chest wall is non tender.    Heart: regular rate with a normal S1 and S2.  There are no murmurs, gallops, or rubs. The PMI is not displaced.     Abdomen: good bowel sounds.  There is no guarding or rebound.  There is no hepatosplenomegaly or tenderness.  There are no masses.   Extremities:  no clubbing, cyanosis, or edema.  The legs are without rashes.  The distal pulses are intact.   Neuro:  Cranial nerves II - XII are intact.  Motor and sensory functions are intact.    The gait is normal.  ECG: In normal sinus rhythm. He has T-wave inversions in the inferior and anterior lateral leads.  The EKG is unchanged from his previous tracing several weeks ago.   Assessment /  Plan:

## 2011-08-15 NOTE — Patient Instructions (Addendum)
You are scheduled for an Echo and Lexiscan in [place], please refer to instructions given today.  Please have labs drawn at [place] and we will call with results.   Please start Nitrostat 0.4 under tongue as needed for chest pain.   Please start Lipitor 10mg  daily.  Your physician recommends that you schedule a follow-up appointment in: 2-3 weeks (after myoview/echo)  Your physician recommends that you return for a FASTING lipid profile: 3 months

## 2011-08-15 NOTE — Assessment & Plan Note (Signed)
Start with samples of crestor 10 mg.  Check lipids and HFP, BMP in 3 months.

## 2011-08-15 NOTE — Assessment & Plan Note (Addendum)
His symptoms are worrisome for unstable angina. We'll order a Lexi scan Myoview study. Will also get an echocardiogram for further evaluation of his left ventricular systolic function. We'll order a troponin level and a BNP.  We will refill his NTG.  Addendum:  August 16, 2011.  The troponin levels returned at 0.66.  His creatinine is 1.92 with a GFR of 42ml/min.   This troponin elevation  is concerning for unstable angina although it's also possible that this Troponin elevation is due to his renal insufficiency.  Cardiac cath could potentially cause further renal deteriation.    We will schedule him for cath.  I don't think we will be able to sort this out    He will need to be at the hospital 4 hours early - NS at 100 cc / hr.  For hydration.  I talked to his son. We discussed the issues in great detail.  He agrees with the decision to cath.

## 2011-08-17 ENCOUNTER — Ambulatory Visit: Payer: Medicare Other | Admitting: Cardiology

## 2011-08-17 ENCOUNTER — Encounter: Payer: Self-pay | Admitting: Cardiology

## 2011-08-17 DIAGNOSIS — R0609 Other forms of dyspnea: Secondary | ICD-10-CM

## 2011-08-17 DIAGNOSIS — R079 Chest pain, unspecified: Secondary | ICD-10-CM

## 2011-08-17 DIAGNOSIS — R0989 Other specified symptoms and signs involving the circulatory and respiratory systems: Secondary | ICD-10-CM

## 2011-08-17 DIAGNOSIS — I2510 Atherosclerotic heart disease of native coronary artery without angina pectoris: Secondary | ICD-10-CM

## 2011-08-24 ENCOUNTER — Other Ambulatory Visit (HOSPITAL_COMMUNITY): Payer: Medicare Other | Admitting: Radiology

## 2011-08-24 ENCOUNTER — Encounter: Payer: Self-pay | Admitting: Cardiovascular Disease

## 2011-08-25 ENCOUNTER — Encounter: Payer: Self-pay | Admitting: Cardiovascular Disease

## 2011-08-25 ENCOUNTER — Ambulatory Visit (INDEPENDENT_AMBULATORY_CARE_PROVIDER_SITE_OTHER): Payer: Medicare Other | Admitting: Cardiovascular Disease

## 2011-08-25 DIAGNOSIS — E785 Hyperlipidemia, unspecified: Secondary | ICD-10-CM

## 2011-08-25 DIAGNOSIS — I2510 Atherosclerotic heart disease of native coronary artery without angina pectoris: Secondary | ICD-10-CM

## 2011-08-25 DIAGNOSIS — I214 Non-ST elevation (NSTEMI) myocardial infarction: Secondary | ICD-10-CM

## 2011-08-25 DIAGNOSIS — I10 Essential (primary) hypertension: Secondary | ICD-10-CM

## 2011-08-25 NOTE — Assessment & Plan Note (Signed)
Recent stent placed to his RCA. Otherwise other regions with mild to moderate disease. He would need continued aggressive medical management.

## 2011-08-25 NOTE — Assessment & Plan Note (Addendum)
In the past, he Did not tolerate Crestor and stopped this on his own secondary to right groin cramping after 2 days on the medication. He is now taking Lipitor 10 mg daily. We'll need to check his cholesterol in 2 months time. Ideally, he will probably need a higher dose, or we could add zetia.

## 2011-08-25 NOTE — Patient Instructions (Addendum)
You are doing well. No medication changes were made. Please call us if you have new issues that need to be addressed before your next appt.  We will call you for a follow up Appt. In 6 months  You will need an Echo. LIVER/LIPID in 3 months. Echocardiogram (ECHO) Your caregiver has requested that you have an echocardiogram. This is a test which produces images of the heart by using sound waves. The echocardiogram is simple, painless, obtained within a short period of time and offers valuable information to your caregiver. The images from an echocardiogram can provide cardiac information such as:  Heart size.   Heart muscle function.   Heart valve function.   Aneurysm detection.   Evidence of a past heart attack.   Fluid buildup around the heart.  Heart muscle thickening.   Assess heart valve function after heart valve surgery.   BEFORE THE PROCEDURE There may be paperwork for you to fill out prior to the echocardiogram. You should arrive   prior to your procedure.  PROCEDURE  No special preparation is needed. Dress and eat normally.   The echocardiogram test normally takes less than an hour to do.   You will need to remove the clothes from your waist up.   A small hand held device about the size of a microphone is lubricated with gel and placed at various areas on your chest. The gel helps transmit the sound waves from the hand held device. The sound waves harmlessly bounce off your heart to allow the heart images to be captured in "real time" motion. These images are then recorded on video tape and stored on a computer so your cardiologist (specialized heart doctor) can interpret the echocardiogram.  RESULTS  Echocardiogram results are usually available the same day you had your test. Ask your caregiver how to obtain your echocardiogram results. It is your responsibility to follow up on obtaining the results.  Document Released: 12/02/2000 Document Re-Released:  09/13/2008 [facility] Patient Information 2011 [place].

## 2011-08-25 NOTE — Assessment & Plan Note (Signed)
Blood pressure is well controlled on today's visit. No changes made to the medications. 

## 2011-08-25 NOTE — Progress Notes (Signed)
Patient ID: Jake Garcia, male    DOB: 1928/12/29, 77 y.o.   MRN: [id number]  HPI Comments: Jake Garcia is a pleasant 77 year old gentleman with a history of coronary artery disease, bypass surgery in December 2009 at [facility], high-grade complex stenosis of the LAD with a LIMA to the LAD which was off-pump, hyperlipidemia, hypertension who presents 4 followup after his recent episode of chest pain, cardiac catheter and stent placement.  He reports that he had an episode of chest pain and presented to the emergency room at [facility]. Date of presentation was August 29. At that time his creatinine was 2.12. He was started on IV fluids. He had a 99% mid RCA lesion with stent placed also noted to have 25% left main, LIMA to the LAD was patent with minimal LAD disease, large ramus with 30% proximal disease, small to moderate OM 2 with 40% mid vessel disease, no LV gram performed. Stent was a Xience 3.5 x 23 mm DES stent.  Since discharge, he has felt well with no further symptoms. He reports that he did have chest pain when lying in bed and improved by sitting outside. This seems to have improved.  Previously he did not have any chest pressure with exertion. No smoking history. He does not exercise.  Last echocardiogram in December 2009 showed mild LVH, diastolic dysfunction, mild TR, mildly elevated right ventricular systolic pressures.  EKG shows normal sinus rhythm with rate 63 beats per minute with T wave abnormality in V3 to V6, 2, 3, aVF   Outpatient Encounter Prescriptions as of 08/25/2011  Medication Sig Dispense Refill  . amLODipine (NORVASC) 10 MG tablet Take 10 mg by mouth daily.        [name] aspirin 325 MG tablet 1 tab prn.       [name] atorvastatin (LIPITOR) 20 MG tablet Take 0.5 tablets (10 mg total) by mouth daily.  30 tablet  6  . clopidogrel (PLAVIX) 75 MG tablet Take 75 mg by mouth daily.        . metoprolol succinate (TOPROL-XL) 25 MG 24 hr tablet Take 25 mg by mouth daily.        .  nitroGLYCERIN (NITROSTAT) 0.4 MG SL tablet Place 1 tablet (0.4 mg total) under the tongue every 5 (five) minutes as needed for chest pain.  25 tablet  3      Review of Systems  Constitutional: Negative.   HENT: Negative.   Eyes: Negative.   Respiratory: Negative.   Cardiovascular: Negative.   Gastrointestinal: Negative.   Musculoskeletal: Negative.   Skin: Negative.   Neurological: Negative.   Hematological: Negative.   Psychiatric/Behavioral: Negative.   All other systems reviewed and are negative.    BP 124/64  Pulse 63  Ht 5\' 8"  (1.727 m)  Wt 176 lb (79.833 kg)  BMI 26.76 kg/m2  Physical Exam  Nursing note and vitals reviewed. Constitutional: He is oriented to person, place, and time. He appears well-developed and well-nourished.  HENT:  Head: Normocephalic.  Nose: Nose normal.  Mouth/Throat: Oropharynx is clear and moist.  Eyes: Conjunctivae are normal. Pupils are equal, round, and reactive to light.  Neck: Normal range of motion. Neck supple. No JVD present.  Cardiovascular: Normal rate, regular rhythm, S1 normal, S2 normal, normal heart sounds and intact distal pulses.  Exam reveals no gallop and no friction rub.   No murmur heard. Pulmonary/Chest: Effort normal and breath sounds normal. No respiratory distress. He has no wheezes. He has no rales.  He exhibits no tenderness.  Abdominal: Soft. Bowel sounds are normal. He exhibits no distension. There is no tenderness.  Musculoskeletal: Normal range of motion. He exhibits no edema and no tenderness.  Lymphadenopathy:    He has no cervical adenopathy.  Neurological: He is alert and oriented to person, place, and time. Coordination normal.  Skin: Skin is warm and dry. No rash noted. No erythema.  Psychiatric: He has a normal mood and affect. His behavior is normal. Judgment and thought content normal.           Assessment and Plan

## 2011-09-01 ENCOUNTER — Encounter: Payer: Medicare Other | Admitting: Cardiovascular Disease

## 2011-09-02 ENCOUNTER — Other Ambulatory Visit: Payer: Medicare Other | Admitting: *Deleted

## 2011-09-06 ENCOUNTER — Other Ambulatory Visit (INDEPENDENT_AMBULATORY_CARE_PROVIDER_SITE_OTHER): Payer: Medicare Other | Admitting: *Deleted

## 2011-09-06 DIAGNOSIS — I2510 Atherosclerotic heart disease of native coronary artery without angina pectoris: Secondary | ICD-10-CM

## 2011-09-06 DIAGNOSIS — I214 Non-ST elevation (NSTEMI) myocardial infarction: Secondary | ICD-10-CM

## 2011-09-11 ENCOUNTER — Encounter: Payer: Self-pay | Admitting: Cardiovascular Disease

## 2011-09-23 LAB — POCT I-STAT 4, (NA,K, GLUC, HGB,HCT)
Glucose, Bld: 106 mg/dL — ABNORMAL HIGH (ref 70–99)
Glucose, Bld: 118 mg/dL — ABNORMAL HIGH (ref 70–99)
Glucose, Bld: 96 mg/dL (ref 70–99)
HCT: 25 % — ABNORMAL LOW (ref 39.0–52.0)
HCT: 25 % — ABNORMAL LOW (ref 39.0–52.0)
HCT: 26 % — ABNORMAL LOW (ref 39.0–52.0)
Hemoglobin: 8.2 g/dL — ABNORMAL LOW (ref 13.0–17.0)
Hemoglobin: 8.5 g/dL — ABNORMAL LOW (ref 13.0–17.0)
Hemoglobin: 8.8 g/dL — ABNORMAL LOW (ref 13.0–17.0)
Hemoglobin: 9.2 g/dL — ABNORMAL LOW (ref 13.0–17.0)
Potassium: 3.8 mEq/L (ref 3.5–5.1)
Potassium: 4.1 mEq/L (ref 3.5–5.1)
Sodium: 139 mEq/L (ref 135–145)

## 2011-09-23 LAB — CBC
HCT: 23.3 % — ABNORMAL LOW (ref 39.0–52.0)
HCT: 24.3 % — ABNORMAL LOW (ref 39.0–52.0)
HCT: 24.7 % — ABNORMAL LOW (ref 39.0–52.0)
HCT: 26.5 % — ABNORMAL LOW (ref 39.0–52.0)
HCT: 27.6 % — ABNORMAL LOW (ref 39.0–52.0)
HCT: 28.6 % — ABNORMAL LOW (ref 39.0–52.0)
HCT: 30.1 % — ABNORMAL LOW (ref 39.0–52.0)
HCT: 30.4 % — ABNORMAL LOW (ref 39.0–52.0)
HCT: 30.5 % — ABNORMAL LOW (ref 39.0–52.0)
HCT: 30.9 % — ABNORMAL LOW (ref 39.0–52.0)
HCT: 32.4 % — ABNORMAL LOW (ref 39.0–52.0)
HCT: 33.3 % — ABNORMAL LOW (ref 39.0–52.0)
HCT: 37.6 % — ABNORMAL LOW (ref 39.0–52.0)
Hemoglobin: 10 g/dL — ABNORMAL LOW (ref 13.0–17.0)
Hemoglobin: 10.5 g/dL — ABNORMAL LOW (ref 13.0–17.0)
Hemoglobin: 10.5 g/dL — ABNORMAL LOW (ref 13.0–17.0)
Hemoglobin: 11.8 g/dL — ABNORMAL LOW (ref 13.0–17.0)
Hemoglobin: 7.3 g/dL — CL (ref 13.0–17.0)
Hemoglobin: 7.6 g/dL — CL (ref 13.0–17.0)
Hemoglobin: 7.7 g/dL — CL (ref 13.0–17.0)
Hemoglobin: 8.5 g/dL — ABNORMAL LOW (ref 13.0–17.0)
Hemoglobin: 9 g/dL — ABNORMAL LOW (ref 13.0–17.0)
Hemoglobin: 9.6 g/dL — ABNORMAL LOW (ref 13.0–17.0)
Hemoglobin: 9.6 g/dL — ABNORMAL LOW (ref 13.0–17.0)
Hemoglobin: 9.7 g/dL — ABNORMAL LOW (ref 13.0–17.0)
MCHC: 30.9 g/dL (ref 30.0–36.0)
MCHC: 31.1 g/dL (ref 30.0–36.0)
MCHC: 31.4 g/dL (ref 30.0–36.0)
MCHC: 31.6 g/dL (ref 30.0–36.0)
MCHC: 31.7 g/dL (ref 30.0–36.0)
MCHC: 31.8 g/dL (ref 30.0–36.0)
MCHC: 32 g/dL (ref 30.0–36.0)
MCHC: 32.1 g/dL (ref 30.0–36.0)
MCHC: 32.3 g/dL (ref 30.0–36.0)
MCHC: 32.4 g/dL (ref 30.0–36.0)
MCHC: 32.7 g/dL (ref 30.0–36.0)
MCV: 65.1 fL — ABNORMAL LOW (ref 78.0–100.0)
MCV: 65.7 fL — ABNORMAL LOW (ref 78.0–100.0)
MCV: 65.9 fL — ABNORMAL LOW (ref 78.0–100.0)
MCV: 66.2 fL — ABNORMAL LOW (ref 78.0–100.0)
MCV: 66.4 fL — ABNORMAL LOW (ref 78.0–100.0)
MCV: 66.6 fL — ABNORMAL LOW (ref 78.0–100.0)
MCV: 66.7 fL — ABNORMAL LOW (ref 78.0–100.0)
MCV: 67 fL — ABNORMAL LOW (ref 78.0–100.0)
MCV: 67.6 fL — ABNORMAL LOW (ref 78.0–100.0)
MCV: 68.6 fL — ABNORMAL LOW (ref 78.0–100.0)
MCV: 68.8 fL — ABNORMAL LOW (ref 78.0–100.0)
Platelets: 168 10*3/uL (ref 150–400)
Platelets: 173 10*3/uL (ref 150–400)
Platelets: 184 10*3/uL (ref 150–400)
Platelets: 188 10*3/uL (ref 150–400)
Platelets: 189 10*3/uL (ref 150–400)
Platelets: 189 10*3/uL (ref 150–400)
Platelets: 212 10*3/uL (ref 150–400)
Platelets: 213 10*3/uL (ref 150–400)
Platelets: 243 10*3/uL (ref 150–400)
Platelets: 258 10*3/uL (ref 150–400)
Platelets: 298 10*3/uL (ref 150–400)
RBC: 3.5 MIL/uL — ABNORMAL LOW (ref 4.22–5.81)
RBC: 3.69 MIL/uL — ABNORMAL LOW (ref 4.22–5.81)
RBC: 3.71 MIL/uL — ABNORMAL LOW (ref 4.22–5.81)
RBC: 4 MIL/uL — ABNORMAL LOW (ref 4.22–5.81)
RBC: 4.21 MIL/uL — ABNORMAL LOW (ref 4.22–5.81)
RBC: 4.4 MIL/uL (ref 4.22–5.81)
RBC: 4.45 MIL/uL (ref 4.22–5.81)
RBC: 4.54 MIL/uL (ref 4.22–5.81)
RBC: 4.68 MIL/uL (ref 4.22–5.81)
RBC: 4.79 MIL/uL (ref 4.22–5.81)
RBC: 4.84 MIL/uL (ref 4.22–5.81)
RBC: 5.71 MIL/uL (ref 4.22–5.81)
RDW: 16.1 % — ABNORMAL HIGH (ref 11.5–15.5)
RDW: 16.2 % — ABNORMAL HIGH (ref 11.5–15.5)
RDW: 16.5 % — ABNORMAL HIGH (ref 11.5–15.5)
RDW: 16.6 % — ABNORMAL HIGH (ref 11.5–15.5)
RDW: 16.7 % — ABNORMAL HIGH (ref 11.5–15.5)
RDW: 17.6 % — ABNORMAL HIGH (ref 11.5–15.5)
RDW: 17.7 % — ABNORMAL HIGH (ref 11.5–15.5)
RDW: 17.9 % — ABNORMAL HIGH (ref 11.5–15.5)
RDW: 18.1 % — ABNORMAL HIGH (ref 11.5–15.5)
RDW: 18.6 % — ABNORMAL HIGH (ref 11.5–15.5)
WBC: 10.9 10*3/uL — ABNORMAL HIGH (ref 4.0–10.5)
WBC: 12.4 10*3/uL — ABNORMAL HIGH (ref 4.0–10.5)
WBC: 13.4 10*3/uL — ABNORMAL HIGH (ref 4.0–10.5)
WBC: 7.3 10*3/uL (ref 4.0–10.5)
WBC: 7.6 10*3/uL (ref 4.0–10.5)
WBC: 7.7 10*3/uL (ref 4.0–10.5)
WBC: 7.7 10*3/uL (ref 4.0–10.5)
WBC: 8.1 10*3/uL (ref 4.0–10.5)
WBC: 8.3 10*3/uL (ref 4.0–10.5)
WBC: 9.4 10*3/uL (ref 4.0–10.5)
WBC: 9.6 10*3/uL (ref 4.0–10.5)
WBC: 9.7 10*3/uL (ref 4.0–10.5)

## 2011-09-23 LAB — BASIC METABOLIC PANEL
BUN: 18 mg/dL (ref 6–23)
BUN: 19 mg/dL (ref 6–23)
BUN: 20 mg/dL (ref 6–23)
BUN: 20 mg/dL (ref 6–23)
BUN: 20 mg/dL (ref 6–23)
BUN: 22 mg/dL (ref 6–23)
BUN: 23 mg/dL (ref 6–23)
BUN: 23 mg/dL (ref 6–23)
BUN: 24 mg/dL — ABNORMAL HIGH (ref 6–23)
CO2: 21 mEq/L (ref 19–32)
CO2: 21 mEq/L (ref 19–32)
CO2: 21 mEq/L (ref 19–32)
CO2: 23 mEq/L (ref 19–32)
CO2: 23 mEq/L (ref 19–32)
CO2: 25 mEq/L (ref 19–32)
CO2: 25 mEq/L (ref 19–32)
Calcium: 7.6 mg/dL — ABNORMAL LOW (ref 8.4–10.5)
Calcium: 8.1 mg/dL — ABNORMAL LOW (ref 8.4–10.5)
Calcium: 8.2 mg/dL — ABNORMAL LOW (ref 8.4–10.5)
Calcium: 8.2 mg/dL — ABNORMAL LOW (ref 8.4–10.5)
Calcium: 8.3 mg/dL — ABNORMAL LOW (ref 8.4–10.5)
Calcium: 8.5 mg/dL (ref 8.4–10.5)
Calcium: 8.6 mg/dL (ref 8.4–10.5)
Chloride: 107 mEq/L (ref 96–112)
Chloride: 107 mEq/L (ref 96–112)
Chloride: 107 mEq/L (ref 96–112)
Chloride: 110 mEq/L (ref 96–112)
Chloride: 111 mEq/L (ref 96–112)
Chloride: 111 mEq/L (ref 96–112)
Chloride: 113 mEq/L — ABNORMAL HIGH (ref 96–112)
Creatinine, Ser: 1.73 mg/dL — ABNORMAL HIGH (ref 0.4–1.5)
Creatinine, Ser: 1.77 mg/dL — ABNORMAL HIGH (ref 0.4–1.5)
Creatinine, Ser: 1.85 mg/dL — ABNORMAL HIGH (ref 0.4–1.5)
Creatinine, Ser: 1.96 mg/dL — ABNORMAL HIGH (ref 0.4–1.5)
Creatinine, Ser: 1.99 mg/dL — ABNORMAL HIGH (ref 0.4–1.5)
Creatinine, Ser: 2.01 mg/dL — ABNORMAL HIGH (ref 0.4–1.5)
Creatinine, Ser: 2.04 mg/dL — ABNORMAL HIGH (ref 0.4–1.5)
GFR calc Af Amer: 39 mL/min — ABNORMAL LOW (ref >60)
GFR calc Af Amer: 39 mL/min — ABNORMAL LOW (ref >60)
GFR calc Af Amer: 40 mL/min — ABNORMAL LOW (ref >60)
GFR calc Af Amer: 43 mL/min — ABNORMAL LOW (ref >60)
GFR calc Af Amer: 47 mL/min — ABNORMAL LOW (ref >60)
GFR calc non Af Amer: 32 mL/min — ABNORMAL LOW (ref >60)
GFR calc non Af Amer: 32 mL/min — ABNORMAL LOW (ref >60)
GFR calc non Af Amer: 33 mL/min — ABNORMAL LOW (ref >60)
GFR calc non Af Amer: 36 mL/min — ABNORMAL LOW (ref >60)
GFR calc non Af Amer: 36 mL/min — ABNORMAL LOW (ref >60)
GFR calc non Af Amer: 37 mL/min — ABNORMAL LOW (ref >60)
GFR calc non Af Amer: 38 mL/min — ABNORMAL LOW (ref >60)
Glucose, Bld: 112 mg/dL — ABNORMAL HIGH (ref 70–99)
Glucose, Bld: 125 mg/dL — ABNORMAL HIGH (ref 70–99)
Glucose, Bld: 184 mg/dL — ABNORMAL HIGH (ref 70–99)
Glucose, Bld: 83 mg/dL (ref 70–99)
Glucose, Bld: 94 mg/dL (ref 70–99)
Glucose, Bld: 95 mg/dL (ref 70–99)
Potassium: 3.6 mEq/L (ref 3.5–5.1)
Potassium: 3.6 mEq/L (ref 3.5–5.1)
Potassium: 3.8 mEq/L (ref 3.5–5.1)
Potassium: 3.8 mEq/L (ref 3.5–5.1)
Potassium: 3.9 mEq/L (ref 3.5–5.1)
Potassium: 3.9 mEq/L (ref 3.5–5.1)
Potassium: 3.9 mEq/L (ref 3.5–5.1)
Potassium: 4.2 mEq/L (ref 3.5–5.1)
Sodium: 133 mEq/L — ABNORMAL LOW (ref 135–145)
Sodium: 133 mEq/L — ABNORMAL LOW (ref 135–145)
Sodium: 137 mEq/L (ref 135–145)
Sodium: 139 mEq/L (ref 135–145)
Sodium: 140 mEq/L (ref 135–145)
Sodium: 143 mEq/L (ref 135–145)
Sodium: 144 mEq/L (ref 135–145)

## 2011-09-23 LAB — COMPREHENSIVE METABOLIC PANEL
ALT: 14 U/L (ref 0–53)
ALT: 21 U/L (ref 0–53)
AST: 22 U/L (ref 0–37)
Albumin: 3 g/dL — ABNORMAL LOW (ref 3.5–5.2)
Alkaline Phosphatase: 63 U/L (ref 39–117)
Alkaline Phosphatase: 64 U/L (ref 39–117)
BUN: 21 mg/dL (ref 6–23)
BUN: 22 mg/dL (ref 6–23)
CO2: 22 mEq/L (ref 19–32)
CO2: 22 mEq/L (ref 19–32)
Calcium: 8.3 mg/dL — ABNORMAL LOW (ref 8.4–10.5)
Chloride: 107 mEq/L (ref 96–112)
Chloride: 111 mEq/L (ref 96–112)
Creatinine, Ser: 1.83 mg/dL — ABNORMAL HIGH (ref 0.4–1.5)
GFR calc Af Amer: 44 mL/min — ABNORMAL LOW (ref >60)
GFR calc non Af Amer: 36 mL/min — ABNORMAL LOW (ref >60)
Glucose, Bld: 102 mg/dL — ABNORMAL HIGH (ref 70–99)
Glucose, Bld: 105 mg/dL — ABNORMAL HIGH (ref 70–99)
Potassium: 4 mEq/L (ref 3.5–5.1)
Potassium: 4.1 mEq/L (ref 3.5–5.1)
Sodium: 137 mEq/L (ref 135–145)
Sodium: 139 mEq/L (ref 135–145)
Total Bilirubin: 0.8 mg/dL (ref 0.3–1.2)
Total Bilirubin: 0.8 mg/dL (ref 0.3–1.2)
Total Protein: 5.4 g/dL — ABNORMAL LOW (ref 6.0–8.3)
Total Protein: 7.3 g/dL (ref 6.0–8.3)

## 2011-09-23 LAB — BLOOD GAS, ARTERIAL
Acid-base deficit: 5 mmol/L — ABNORMAL HIGH (ref 0.0–2.0)
Bicarbonate: 18.3 mEq/L — ABNORMAL LOW (ref 20.0–24.0)
FIO2: 0.21 %
O2 Saturation: 97.5 %
Patient temperature: 98.6
TCO2: 19.2 mmol/L (ref 0–100)
pCO2 arterial: 27.2 mmHg — ABNORMAL LOW (ref 35.0–45.0)
pH, Arterial: 7.444 (ref 7.350–7.450)
pO2, Arterial: 84.1 mmHg (ref 80.0–100.0)

## 2011-09-23 LAB — POCT I-STAT, CHEM 8
BUN: 21 mg/dL (ref 6–23)
Creatinine, Ser: 1.8 mg/dL — ABNORMAL HIGH (ref 0.4–1.5)
Glucose, Bld: 171 mg/dL — ABNORMAL HIGH (ref 70–99)
HCT: 31 % — ABNORMAL LOW (ref 39.0–52.0)
Hemoglobin: 10.5 g/dL — ABNORMAL LOW (ref 13.0–17.0)
Potassium: 4.1 mEq/L (ref 3.5–5.1)
Sodium: 137 mEq/L (ref 135–145)
TCO2: 19 mmol/L (ref 0–100)
TCO2: 21 mmol/L (ref 0–100)

## 2011-09-23 LAB — PROTEIN, URINE, 24 HOUR
Protein, 24H Urine: 255 mg/d — ABNORMAL HIGH (ref 50–100)
Urine Total Volume-UPROT: 3645 mL

## 2011-09-23 LAB — CK TOTAL AND CKMB (NOT AT ARMC)
CK, MB: 2.2 ng/mL (ref 0.3–4.0)
Relative Index: 1.7 (ref 0.0–2.5)
Total CK: 132 U/L (ref 7–232)

## 2011-09-23 LAB — DIFFERENTIAL
Basophils Relative: 1 % (ref 0–1)
Eosinophils Relative: 6 % — ABNORMAL HIGH (ref 0–5)
Lymphs Abs: 1.1 10*3/uL (ref 0.7–4.0)
Monocytes Absolute: 0.7 10*3/uL (ref 0.1–1.0)
Monocytes Relative: 9 % (ref 3–12)

## 2011-09-23 LAB — URINALYSIS, ROUTINE W REFLEX MICROSCOPIC
Bilirubin Urine: NEGATIVE
Glucose, UA: NEGATIVE mg/dL
Hgb urine dipstick: NEGATIVE
Ketones, ur: NEGATIVE mg/dL
Nitrite: NEGATIVE
Protein, ur: NEGATIVE mg/dL
Specific Gravity, Urine: 1.013 (ref 1.005–1.030)
Urobilinogen, UA: 0.2 mg/dL (ref 0.0–1.0)
pH: 6 (ref 5.0–8.0)

## 2011-09-23 LAB — GLUCOSE, CAPILLARY
Glucose-Capillary: 103 mg/dL — ABNORMAL HIGH (ref 70–99)
Glucose-Capillary: 113 mg/dL — ABNORMAL HIGH (ref 70–99)
Glucose-Capillary: 114 mg/dL — ABNORMAL HIGH (ref 70–99)
Glucose-Capillary: 115 mg/dL — ABNORMAL HIGH (ref 70–99)
Glucose-Capillary: 117 mg/dL — ABNORMAL HIGH (ref 70–99)
Glucose-Capillary: 139 mg/dL — ABNORMAL HIGH (ref 70–99)
Glucose-Capillary: 164 mg/dL — ABNORMAL HIGH (ref 70–99)
Glucose-Capillary: 187 mg/dL — ABNORMAL HIGH (ref 70–99)
Glucose-Capillary: 199 mg/dL — ABNORMAL HIGH (ref 70–99)
Glucose-Capillary: 89 mg/dL (ref 70–99)

## 2011-09-23 LAB — POCT I-STAT 3, ART BLOOD GAS (G3+)
Bicarbonate: 21.6 mEq/L (ref 20.0–24.0)
O2 Saturation: 88 %
O2 Saturation: 93 %
TCO2: 19 mmol/L (ref 0–100)
TCO2: 23 mmol/L (ref 0–100)
pCO2 arterial: 29.2 mmHg — ABNORMAL LOW (ref 35.0–45.0)
pCO2 arterial: 33.8 mmHg — ABNORMAL LOW (ref 35.0–45.0)
pH, Arterial: 7.328 — ABNORMAL LOW (ref 7.350–7.450)
pH, Arterial: 7.369 (ref 7.350–7.450)
pO2, Arterial: 204 mmHg — ABNORMAL HIGH (ref 80.0–100.0)
pO2, Arterial: 64 mmHg — ABNORMAL LOW (ref 80.0–100.0)

## 2011-09-23 LAB — PROTEIN ELECTROPH W RFLX QUANT IMMUNOGLOBULINS
Alpha-1-Globulin: 5.3 % — ABNORMAL HIGH (ref 2.9–4.9)
Gamma Globulin: 15.7 % (ref 11.1–18.8)
M-Spike, %: NOT DETECTED g/dL

## 2011-09-23 LAB — FOLATE RBC: RBC Folate: 877 ng/mL — ABNORMAL HIGH (ref 180–600)

## 2011-09-23 LAB — FERRITIN: Ferritin: 42 ng/mL (ref 22–322)

## 2011-09-23 LAB — CREATININE, SERUM
Creatinine, Ser: 1.63 mg/dL — ABNORMAL HIGH (ref 0.4–1.5)
GFR calc Af Amer: 50 mL/min — ABNORMAL LOW (ref >60)
GFR calc non Af Amer: 41 mL/min — ABNORMAL LOW (ref >60)

## 2011-09-23 LAB — PROTIME-INR
INR: 1 (ref 0.00–1.49)
INR: 1.2 (ref 0.00–1.49)
Prothrombin Time: 13.9 seconds (ref 11.6–15.2)
Prothrombin Time: 14.1 seconds (ref 11.6–15.2)
Prothrombin Time: 15.3 seconds — ABNORMAL HIGH (ref 11.6–15.2)

## 2011-09-23 LAB — CREATININE, URINE, 24 HOUR: Urine Total Volume-UCRE24: 3645 mL

## 2011-09-23 LAB — APTT
aPTT: 34 seconds (ref 24–37)
aPTT: 35 seconds (ref 24–37)

## 2011-09-23 LAB — TYPE AND SCREEN: Antibody Screen: NEGATIVE

## 2011-09-23 LAB — IRON AND TIBC
Iron: 62 ug/dL (ref 42–135)
UIBC: 181 ug/dL

## 2011-09-23 LAB — ERYTHROPOIETIN: Erythropoietin: 43.5 m[IU]/mL — ABNORMAL HIGH (ref 2.6–34.0)

## 2011-09-23 LAB — HEMOGLOBINOPATHY EVALUATION
Hgb F Quant: 0 % (ref 0.0–2.0)
Hgb S Quant: 0 % (ref 0.0–0.0)

## 2011-09-23 LAB — RETICULOCYTES
RBC.: 4.65 MIL/uL (ref 4.22–5.81)
Retic Ct Pct: 1.4 % (ref 0.4–3.1)

## 2011-09-23 LAB — IGG, IGA, IGM
IgA: 254 mg/dL (ref 68–378)
IgG (Immunoglobin G), Serum: 982 mg/dL (ref 694–1618)
IgM, Serum: 62 mg/dL (ref 60–263)

## 2011-09-23 LAB — KAPPA/LAMBDA LIGHT CHAINS
Kappa free light chain: 3.06 mg/dL — ABNORMAL HIGH (ref 0.33–1.94)
Lambda free light chains: 2.82 mg/dL — ABNORMAL HIGH (ref 0.57–2.63)

## 2011-09-23 LAB — FOLATE: Folate: 15.6 ng/mL

## 2011-09-23 LAB — BETA HCG QUANT (REF LAB): Beta hCG, Tumor Marker: <0.5 m[IU]/mL (ref <5.0)

## 2011-09-23 LAB — HEPARIN LEVEL (UNFRACTIONATED): Heparin Unfractionated: <0.1 IU/mL — ABNORMAL LOW (ref 0.30–0.70)

## 2011-09-23 LAB — MAGNESIUM
Magnesium: 2.3 mg/dL (ref 1.5–2.5)
Magnesium: 2.6 mg/dL — ABNORMAL HIGH (ref 1.5–2.5)

## 2011-10-07 ENCOUNTER — Encounter: Payer: Self-pay | Admitting: Cardiovascular Disease

## 2011-11-24 ENCOUNTER — Other Ambulatory Visit: Payer: Medicare Other | Admitting: *Deleted

## 2011-11-25 ENCOUNTER — Ambulatory Visit (INDEPENDENT_AMBULATORY_CARE_PROVIDER_SITE_OTHER): Payer: Medicare Other | Admitting: *Deleted

## 2011-11-25 DIAGNOSIS — E785 Hyperlipidemia, unspecified: Secondary | ICD-10-CM

## 2011-11-26 LAB — HEPATIC FUNCTION PANEL
ALT: 15 U/L (ref 0–53)
AST: 16 U/L (ref 0–37)
Alkaline Phosphatase: 92 U/L (ref 39–117)
Bilirubin, Direct: 0.2 mg/dL (ref 0.0–0.3)
Indirect Bilirubin: 0.5 mg/dL (ref 0.0–0.9)
Total Protein: 7.1 g/dL (ref 6.0–8.3)

## 2011-11-26 LAB — LIPID PANEL
Cholesterol: 157 mg/dL (ref 0–200)
Triglycerides: 105 mg/dL (ref <150)

## 2011-12-06 ENCOUNTER — Other Ambulatory Visit: Payer: Self-pay

## 2012-01-06 DIAGNOSIS — I2510 Atherosclerotic heart disease of native coronary artery without angina pectoris: Secondary | ICD-10-CM | POA: Diagnosis not present

## 2012-01-06 DIAGNOSIS — L719 Rosacea, unspecified: Secondary | ICD-10-CM | POA: Diagnosis not present

## 2012-01-06 DIAGNOSIS — Z79899 Other long term (current) drug therapy: Secondary | ICD-10-CM | POA: Diagnosis not present

## 2012-01-06 DIAGNOSIS — Z01810 Encounter for preprocedural cardiovascular examination: Secondary | ICD-10-CM | POA: Diagnosis not present

## 2012-01-06 DIAGNOSIS — Z01818 Encounter for other preprocedural examination: Secondary | ICD-10-CM | POA: Diagnosis not present

## 2012-01-16 ENCOUNTER — Encounter: Payer: Self-pay | Admitting: *Deleted

## 2012-01-16 ENCOUNTER — Telehealth: Payer: Self-pay | Admitting: Cardiovascular Disease

## 2012-01-16 NOTE — Telephone Encounter (Signed)
Pt calling stating that [facility] needs to know information about his medication and only Dr Rockey Situ can answer the questions per pt

## 2012-01-16 NOTE — Telephone Encounter (Signed)
Pt is needing to have a surgery on his nose for a disease that causes blood to fill up in the nares, this is to be done at [facility]. During his pre-op they told him he could not have done without holding Plavix. I told pt he would need to wait until it has been 1 yr on Plavix since his stent (done 07/2011). Pt understands, will forward to Dr. Rockey Situ.

## 2012-02-23 ENCOUNTER — Other Ambulatory Visit: Payer: Self-pay | Admitting: Cardiovascular Disease

## 2012-02-23 MED ORDER — AMLODIPINE BESYLATE 10 MG PO TABS: 10.0000 mg | ORAL_TABLET | Freq: Every day | ORAL | Status: DC

## 2012-02-23 MED ORDER — CLOPIDOGREL BISULFATE 75 MG PO TABS: 75.0000 mg | ORAL_TABLET | Freq: Every day | ORAL | Status: DC

## 2012-02-23 NOTE — Telephone Encounter (Signed)
Refill sent for pradaxa and amlodipine.

## 2012-02-23 NOTE — Telephone Encounter (Signed)
Pt would like a 90 day on both of these

## 2012-07-19 DIAGNOSIS — H903 Sensorineural hearing loss, bilateral: Secondary | ICD-10-CM | POA: Diagnosis not present

## 2012-07-19 DIAGNOSIS — H6120 Impacted cerumen, unspecified ear: Secondary | ICD-10-CM | POA: Diagnosis not present

## 2012-07-19 DIAGNOSIS — H9319 Tinnitus, unspecified ear: Secondary | ICD-10-CM | POA: Diagnosis not present

## 2012-07-19 DIAGNOSIS — H60509 Unspecified acute noninfective otitis externa, unspecified ear: Secondary | ICD-10-CM | POA: Diagnosis not present

## 2013-03-27 ENCOUNTER — Other Ambulatory Visit: Payer: Self-pay | Admitting: Cardiovascular Disease

## 2013-06-10 DIAGNOSIS — I10 Essential (primary) hypertension: Secondary | ICD-10-CM | POA: Diagnosis not present

## 2013-06-12 ENCOUNTER — Telehealth: Payer: Self-pay

## 2013-06-12 DIAGNOSIS — L719 Rosacea, unspecified: Secondary | ICD-10-CM | POA: Diagnosis not present

## 2013-06-12 NOTE — Telephone Encounter (Signed)
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 DR Debe Coder at [facility] fax [phone] phone [phone]

## 2013-06-12 NOTE — Telephone Encounter (Signed)
Pt needs appt to discuss Has not been seen since 2012

## 2013-06-25 ENCOUNTER — Ambulatory Visit (INDEPENDENT_AMBULATORY_CARE_PROVIDER_SITE_OTHER): Payer: Medicare Other | Admitting: Cardiovascular Disease

## 2013-06-25 ENCOUNTER — Encounter: Payer: Self-pay | Admitting: Cardiovascular Disease

## 2013-06-25 VITALS — BP 150/82 | HR 83 | Ht 67.5 in | Wt 168.8 lb

## 2013-06-25 DIAGNOSIS — I6523 Occlusion and stenosis of bilateral carotid arteries: Secondary | ICD-10-CM

## 2013-06-25 DIAGNOSIS — I10 Essential (primary) hypertension: Secondary | ICD-10-CM

## 2013-06-25 DIAGNOSIS — I6529 Occlusion and stenosis of unspecified carotid artery: Secondary | ICD-10-CM | POA: Insufficient documentation

## 2013-06-25 DIAGNOSIS — E785 Hyperlipidemia, unspecified: Secondary | ICD-10-CM

## 2013-06-25 DIAGNOSIS — I2510 Atherosclerotic heart disease of native coronary artery without angina pectoris: Secondary | ICD-10-CM | POA: Diagnosis not present

## 2013-06-25 DIAGNOSIS — I658 Occlusion and stenosis of other precerebral arteries: Secondary | ICD-10-CM

## 2013-06-25 DIAGNOSIS — Z01810 Encounter for preprocedural cardiovascular examination: Secondary | ICD-10-CM

## 2013-06-25 MED ORDER — AMLODIPINE BESYLATE 10 MG PO TABS: 10.0000 mg | ORAL_TABLET | Freq: Every day | ORAL | Status: DC

## 2013-06-25 MED ORDER — METOPROLOL SUCCINATE ER 25 MG PO TB24: 25.0000 mg | ORAL_TABLET | Freq: Every day | ORAL | Status: DC

## 2013-06-25 NOTE — Assessment & Plan Note (Signed)
Blood pressure is well controlled on today's visit. No changes made to the medications. 

## 2013-06-25 NOTE — Patient Instructions (Addendum)
You are doing well.  We will schedule a carotid ultrasound in September (or when you prefer) Please continue the aspirin 81 mg a day  Please call us if you have new issues that need to be addressed before your next appt.  Your physician wants you to follow-up in: 6 months.  You will receive a reminder letter in the mail two months in advance. If you don't receive a letter, please call our office to schedule the follow-up appointment.

## 2013-06-25 NOTE — Assessment & Plan Note (Signed)
Currently with no symptoms of angina. We have recommended he stay on his aspirin 81 mg daily. He stopped the Plavix on his own. Also stopped Lipitor on his own. No further testing at this time.

## 2013-06-25 NOTE — Assessment & Plan Note (Signed)
We have suggested that ideally we would like to keep him on a statin. He reports having groin pain and does not want additional medications at this time.

## 2013-06-25 NOTE — Assessment & Plan Note (Signed)
In 2009 at 60% bilateral carotid stenosis. Lost to followup. We have suggested he have repeat carotid ultrasound. He would like to wait until September or October to have this done. We have recommended he stay on his aspirin daily. Ideally he should be restarted on a statin but this caused "groin pain".

## 2013-06-25 NOTE — Assessment & Plan Note (Addendum)
Currently with no symptoms of angina. He has been revascularized in the past several years. Ideally I would like to keep him on aspirin 81 mg daily. No further testing needed at this time. Acceptable risk for rhinophyma and general anesthesia.

## 2013-06-25 NOTE — Progress Notes (Signed)
Patient ID: Jake Garcia, male    DOB: [birth year], 79 y.o.   MRN: [id number]  HPI Comments: Jake Garcia is an 79 year old gentleman with a history of coronary artery disease, peripheral vascular disease with 60% bilateral carotid stenosis in 2009,  bypass surgery in December 2009 at [facility], high-grade complex stenosis of the LAD with a LIMA to the LAD which was off-pump, hyperlipidemia, hypertension,  episode of chest pain August 2012 with cardiac catheter and stent placement. He has not had followup since that time the presents for routine followup today as well as for preop evaluation prior to nose surgery.   He states that he stopped his antiplatelets one year ago. He takes aspirin intermittently. He is not taking his cholesterol medication as this caused groin pain. He has continued on amlodipine and metoprolol. He denies any significant symptoms of chest pain or shortness of breath with exertion. He does not do any regular exercise. He is a nonsmoker.  At the time of his cardiac catheterization, He had a 99% mid RCA lesion with stent placed also noted to have 25% left main, LIMA to the LAD was patent with minimal LAD disease, large ramus with 30% proximal disease, small to moderate OM 2 with 40% mid vessel disease, no LV gram performed. Stent was a Xience 3.5 x 23 mm DES stent.   echocardiogram in December 2009 showed mild LVH, diastolic dysfunction, mild TR, mildly elevated right ventricular systolic pressures.  EKG shows normal sinus rhythm with rate 83  beats per minute with T wave abnormality in V3 to V6, 2, 3, aVF   Outpatient Encounter Prescriptions as of 06/25/2013  Medication Sig Dispense Refill  . amLODipine (NORVASC) 10 MG tablet Take 1 tablet (10 mg total) by mouth daily.  90 tablet  3  . metoprolol succinate (TOPROL-XL) 25 MG 24 hr tablet Take 1 tablet (25 mg total) by mouth daily.  90 tablet  3  . nitroGLYCERIN (NITROSTAT) 0.4 MG SL tablet Place 1 tablet (0.4 mg total) under the  tongue every 5 (five) minutes as needed for chest pain.  25 tablet  3  He takes aspirin 81 mg sparingly   Review of Systems  Constitutional: Negative.   HENT: Negative.   Eyes: Negative.   Respiratory: Negative.   Cardiovascular: Negative.   Gastrointestinal: Negative.   Musculoskeletal: Negative.   Skin: Negative.   Neurological: Negative.   Psychiatric/Behavioral: Negative.   All other systems reviewed and are negative.    BP 150/82  Pulse 83  Ht 5' 7.5" (1.715 m)  Wt 168 lb 12 oz (76.544 kg)  BMI 26.02 kg/m2  Physical Exam  Nursing note and vitals reviewed. Constitutional: He is oriented to person, place, and time. He appears well-developed and well-nourished.  HENT:  Head: Normocephalic.  Nose: Nose normal.  Mouth/Throat: Oropharynx is clear and moist.  Eyes: Conjunctivae are normal. Pupils are equal, round, and reactive to light.  Neck: Normal range of motion. Neck supple. No JVD present.  Cardiovascular: Normal rate, regular rhythm, S1 normal, S2 normal, normal heart sounds and intact distal pulses.  Exam reveals no gallop and no friction rub.   No murmur heard. Pulmonary/Chest: Effort normal and breath sounds normal. No respiratory distress. He has no wheezes. He has no rales. He exhibits no tenderness.  Abdominal: Soft. Bowel sounds are normal. He exhibits no distension. There is no tenderness.  Musculoskeletal: Normal range of motion. He exhibits no edema and no tenderness.  Lymphadenopathy:  He has no cervical adenopathy.  Neurological: He is alert and oriented to person, place, and time. Coordination normal.  Skin: Skin is warm and dry. No rash noted. No erythema.  Psychiatric: He has a normal mood and affect. His behavior is normal. Judgment and thought content normal.      Assessment and Plan

## 2013-08-23 DIAGNOSIS — L719 Rosacea, unspecified: Secondary | ICD-10-CM | POA: Diagnosis not present

## 2013-08-23 DIAGNOSIS — J3489 Other specified disorders of nose and nasal sinuses: Secondary | ICD-10-CM | POA: Diagnosis not present

## 2013-08-23 DIAGNOSIS — J342 Deviated nasal septum: Secondary | ICD-10-CM | POA: Diagnosis not present

## 2013-08-23 DIAGNOSIS — J343 Hypertrophy of nasal turbinates: Secondary | ICD-10-CM | POA: Diagnosis not present

## 2013-09-20 DIAGNOSIS — C4401 Basal cell carcinoma of skin of lip: Secondary | ICD-10-CM | POA: Diagnosis not present

## 2013-09-20 DIAGNOSIS — J342 Deviated nasal septum: Secondary | ICD-10-CM | POA: Diagnosis not present

## 2013-09-20 DIAGNOSIS — J3489 Other specified disorders of nose and nasal sinuses: Secondary | ICD-10-CM | POA: Diagnosis not present

## 2013-09-20 DIAGNOSIS — J343 Hypertrophy of nasal turbinates: Secondary | ICD-10-CM | POA: Diagnosis not present

## 2013-10-11 DIAGNOSIS — Z23 Encounter for immunization: Secondary | ICD-10-CM | POA: Diagnosis not present

## 2013-10-22 DIAGNOSIS — Z481 Encounter for planned postprocedural wound closure: Secondary | ICD-10-CM | POA: Diagnosis not present

## 2013-10-22 DIAGNOSIS — Z85828 Personal history of other malignant neoplasm of skin: Secondary | ICD-10-CM | POA: Diagnosis not present

## 2013-10-22 DIAGNOSIS — C4401 Basal cell carcinoma of skin of lip: Secondary | ICD-10-CM | POA: Diagnosis not present

## 2013-11-22 DIAGNOSIS — L719 Rosacea, unspecified: Secondary | ICD-10-CM | POA: Diagnosis not present

## 2013-11-22 DIAGNOSIS — J342 Deviated nasal septum: Secondary | ICD-10-CM | POA: Insufficient documentation

## 2013-11-22 DIAGNOSIS — J3489 Other specified disorders of nose and nasal sinuses: Secondary | ICD-10-CM | POA: Insufficient documentation

## 2013-12-24 ENCOUNTER — Encounter: Payer: Self-pay | Admitting: Cardiovascular Disease

## 2013-12-24 ENCOUNTER — Ambulatory Visit (INDEPENDENT_AMBULATORY_CARE_PROVIDER_SITE_OTHER): Payer: Medicare Other | Admitting: Cardiovascular Disease

## 2013-12-24 VITALS — BP 118/56 | HR 65 | Ht 67.0 in | Wt 166.0 lb

## 2013-12-24 DIAGNOSIS — I10 Essential (primary) hypertension: Secondary | ICD-10-CM | POA: Diagnosis not present

## 2013-12-24 DIAGNOSIS — I2510 Atherosclerotic heart disease of native coronary artery without angina pectoris: Secondary | ICD-10-CM

## 2013-12-24 DIAGNOSIS — I6529 Occlusion and stenosis of unspecified carotid artery: Secondary | ICD-10-CM

## 2013-12-24 DIAGNOSIS — I6521 Occlusion and stenosis of right carotid artery: Secondary | ICD-10-CM

## 2013-12-24 MED ORDER — METOPROLOL SUCCINATE ER 25 MG PO TB24: 25.0000 mg | ORAL_TABLET | Freq: Every day | ORAL | Status: DC

## 2013-12-24 MED ORDER — AMLODIPINE BESYLATE 10 MG PO TABS: 10.0000 mg | ORAL_TABLET | Freq: Every day | ORAL | Status: DC

## 2013-12-24 NOTE — Progress Notes (Signed)
Patient ID: Jake Garcia, male    DOB: 12-28-1928, 80 y.o.   MRN: [id number]  HPI Comments: Jake Garcia is an 80 -year-old  male with a history of coronary artery disease, peripheral vascular disease with 60% bilateral carotid stenosis in 2009,  bypass surgery in December 2009 at [facility], high-grade complex stenosis of the LAD with a LIMA to the LAD which was off-pump, hyperlipidemia, hypertension,  episode of chest pain August 2012 with cardiac catheter and stent placement.  history of medication noncompliance   In general he reports that he is well. He has not been taking aspirin or a cholesterol medication by his own choice. He reports a cholesterol medication caused groin discomfort. No reason why he is not on aspirin. This was mentioned to him on his last visit. He is a nonsmoker, no regular exercise. He denies any significant symptoms of chest pain or shortness of breath with exertion. He does not do any regular exercise. He is a nonsmoker.  At the time of his cardiac catheterization, He had a 99% mid RCA lesion with stent placed also noted to have 25% left main, LIMA to the LAD was patent with minimal LAD disease, large ramus with 30% proximal disease, small to moderate OM 2 with 40% mid vessel disease, no LV gram performed. Stent was a Xience 3.5 x 23 mm DES stent.   echocardiogram in December 2009 showed mild LVH, diastolic dysfunction, mild TR, mildly elevated right ventricular systolic pressures.  EKG shows normal sinus rhythm with rate 65  beats per minute with T wave abnormality in V3 to V6, 2, 3, aVF   Outpatient Encounter Prescriptions as of 12/24/2013  Medication Sig  . amLODipine (NORVASC) 10 MG tablet Take 1 tablet (10 mg total) by mouth daily.  . metoprolol succinate (TOPROL-XL) 25 MG 24 hr tablet Take 1 tablet (25 mg total) by mouth daily.  . [DISCONTINUED] nitroGLYCERIN (NITROSTAT) 0.4 MG SL tablet Place 1 tablet (0.4 mg total) under the tongue every 5 (five) minutes as needed  for chest pain.    Review of Systems  Constitutional: Negative.   HENT: Negative.   Eyes: Negative.   Respiratory: Negative.   Cardiovascular: Negative.   Gastrointestinal: Negative.   Endocrine: Negative.   Musculoskeletal: Negative.   Skin: Negative.   Allergic/Immunologic: Negative.   Neurological: Negative.   Hematological: Negative.   Psychiatric/Behavioral: Negative.   All other systems reviewed and are negative.    BP 118/56  Pulse 65  Ht 5\' 7"  (1.702 m)  Wt 166 lb (75.297 kg)  BMI 25.99 kg/m2  Physical Exam  Nursing note and vitals reviewed. Constitutional: He is oriented to person, place, and time. He appears well-developed and well-nourished.  HENT:  Head: Normocephalic.  Nose: Nose normal.  Mouth/Throat: Oropharynx is clear and moist.  Eyes: Conjunctivae are normal. Pupils are equal, round, and reactive to light.  Neck: Normal range of motion. Neck supple. No JVD present.  Cardiovascular: Normal rate, regular rhythm, S1 normal, S2 normal, normal heart sounds and intact distal pulses.  Exam reveals no gallop and no friction rub.   No murmur heard. Pulmonary/Chest: Effort normal and breath sounds normal. No respiratory distress. He has no wheezes. He has no rales. He exhibits no tenderness.  Abdominal: Soft. Bowel sounds are normal. He exhibits no distension. There is no tenderness.  Musculoskeletal: Normal range of motion. He exhibits no edema and no tenderness.  Lymphadenopathy:    He has no cervical adenopathy.  Neurological: He  is alert and oriented to person, place, and time. Coordination normal.  Skin: Skin is warm and dry. No rash noted. No erythema.  Psychiatric: He has a normal mood and affect. His behavior is normal. Judgment and thought content normal.      Assessment and Plan

## 2013-12-24 NOTE — Assessment & Plan Note (Signed)
Repeat carotid ultrasound later this year. No moderate disease, bruit on the right

## 2013-12-24 NOTE — Patient Instructions (Addendum)
You are doing well. No medication changes were made.  Please call us if you have new issues that need to be addressed before your next appt.  Your physician wants you to follow-up in: 6 months.  You will receive a reminder letter in the mail two months in advance. If you don't receive a letter, please call our office to schedule the follow-up appointment.   

## 2013-12-24 NOTE — Assessment & Plan Note (Signed)
Currently with no symptoms of angina. No further workup at this time. Continue current medication regimen. Recommended he restart his aspirin. He is not interested in a cholesterol medication

## 2013-12-24 NOTE — Assessment & Plan Note (Signed)
Blood pressure is well controlled on today's visit. No changes made to the medications. 

## 2014-01-31 DIAGNOSIS — Z01818 Encounter for other preprocedural examination: Secondary | ICD-10-CM | POA: Diagnosis not present

## 2014-01-31 DIAGNOSIS — J343 Hypertrophy of nasal turbinates: Secondary | ICD-10-CM | POA: Diagnosis not present

## 2014-01-31 DIAGNOSIS — L719 Rosacea, unspecified: Secondary | ICD-10-CM | POA: Diagnosis not present

## 2014-02-03 DIAGNOSIS — L919 Hypertrophic disorder of the skin, unspecified: Secondary | ICD-10-CM | POA: Diagnosis not present

## 2014-02-03 DIAGNOSIS — J3489 Other specified disorders of nose and nasal sinuses: Secondary | ICD-10-CM | POA: Diagnosis not present

## 2014-02-03 DIAGNOSIS — J343 Hypertrophy of nasal turbinates: Secondary | ICD-10-CM | POA: Diagnosis not present

## 2014-02-03 DIAGNOSIS — L909 Atrophic disorder of skin, unspecified: Secondary | ICD-10-CM | POA: Diagnosis not present

## 2014-02-03 DIAGNOSIS — L719 Rosacea, unspecified: Secondary | ICD-10-CM | POA: Diagnosis not present

## 2014-10-09 DIAGNOSIS — Z23 Encounter for immunization: Secondary | ICD-10-CM | POA: Diagnosis not present

## 2014-11-06 DIAGNOSIS — H9319 Tinnitus, unspecified ear: Secondary | ICD-10-CM | POA: Diagnosis not present

## 2014-11-06 DIAGNOSIS — H903 Sensorineural hearing loss, bilateral: Secondary | ICD-10-CM | POA: Diagnosis not present

## 2014-11-17 ENCOUNTER — Ambulatory Visit (INDEPENDENT_AMBULATORY_CARE_PROVIDER_SITE_OTHER): Payer: Medicare Other | Admitting: Cardiovascular Disease

## 2014-11-17 ENCOUNTER — Encounter: Payer: Self-pay | Admitting: Cardiovascular Disease

## 2014-11-17 VITALS — BP 160/78 | HR 72 | Ht 67.0 in | Wt 163.8 lb

## 2014-11-17 DIAGNOSIS — R0789 Other chest pain: Secondary | ICD-10-CM

## 2014-11-17 DIAGNOSIS — I6521 Occlusion and stenosis of right carotid artery: Secondary | ICD-10-CM | POA: Diagnosis not present

## 2014-11-17 DIAGNOSIS — E785 Hyperlipidemia, unspecified: Secondary | ICD-10-CM | POA: Diagnosis not present

## 2014-11-17 DIAGNOSIS — I10 Essential (primary) hypertension: Secondary | ICD-10-CM

## 2014-11-17 DIAGNOSIS — I2510 Atherosclerotic heart disease of native coronary artery without angina pectoris: Secondary | ICD-10-CM | POA: Diagnosis not present

## 2014-11-17 DIAGNOSIS — I25119 Atherosclerotic heart disease of native coronary artery with unspecified angina pectoris: Secondary | ICD-10-CM | POA: Diagnosis not present

## 2014-11-17 NOTE — Assessment & Plan Note (Signed)
Currently with no symptoms of angina. No further testing

## 2014-11-17 NOTE — Patient Instructions (Signed)
You are doing well. No medication changes were made.  Please call us if you have new issues that need to be addressed before your next appt.  Your physician wants you to follow-up in: 6 months.  You will receive a reminder letter in the mail two months in advance. If you don't receive a letter, please call our office to schedule the follow-up appointment.   

## 2014-11-17 NOTE — Assessment & Plan Note (Signed)
He reports blood pressure well controlled at home. No medication changes made. Encouraged him to monitor his pressure as he is doing

## 2014-11-17 NOTE — Addendum Note (Signed)
Addended by: Minna Merritts on: 11/17/2014 05:05 PM   Modules accepted: Level of Service

## 2014-11-17 NOTE — Progress Notes (Signed)
Patient ID: Jake Garcia, male    DOB: September 05, 1929, 80 y.o.   MRN: [id number]  HPI Comments: Mr. Leandro is an 52 -year-old  male with a history of coronary artery disease, peripheral vascular disease with 60% bilateral carotid stenosis in 2009,  bypass surgery in December 2009 at [facility], high-grade complex stenosis of the LAD with a LIMA to the LAD which was off-pump, hyperlipidemia, hypertension,  episode of chest pain August 2012 with cardiac catheter and stent placement.  history of medication noncompliance  He presents today for follow-up of his coronary artery disease  In general he reports that he is well.  He has not been taking aspirin or a cholesterol medication by his own choice. He reports that he does take aspirin periodically. He is walking on a daily basis with no symptoms concerning for angina. He is very angry with the government, feels we are doing poorly as a country He is bothered by pain property taxes for his various properties. He denies any significant symptoms of chest pain or shortness of breath with exertion.  He is a nonsmoker. He reports blood pressure at home typically 117 over 50s heart rate in the 70s.  EKG today shows normal sinus rhythm with rate 72 bpm, nonspecific ST abnormality  At the time of his cardiac catheterization, He had a 99% mid RCA lesion with stent placed also noted to have 25% left main, LIMA to the LAD was patent with minimal LAD disease, large ramus with 30% proximal disease, small to moderate OM 2 with 40% mid vessel disease, no LV gram performed. Stent was a Xience 3.5 x 23 mm DES stent.   echocardiogram in December 2009 showed mild LVH, diastolic dysfunction, mild TR, mildly elevated right ventricular systolic pressures.   Outpatient Encounter Prescriptions as of 11/17/2014  Medication Sig  . amLODipine (NORVASC) 10 MG tablet Take 1 tablet (10 mg total) by mouth daily.  . metoprolol succinate (TOPROL-XL) 25 MG 24 hr tablet Take 1  tablet (25 mg total) by mouth daily.   Social history  reports that he has never smoked. He does not have any smokeless tobacco history on file. He reports that he does not drink alcohol or use illicit drugs.  Review of Systems  Constitutional: Negative.   Respiratory: Negative.   Cardiovascular: Negative.   Gastrointestinal: Negative.   Musculoskeletal: Negative.   Neurological: Negative.   Hematological: Negative.   Psychiatric/Behavioral: Negative.   All other systems reviewed and are negative.   BP 160/78 mmHg  Pulse 72  Ht 5\' 7"  (1.702 m)  Wt 163 lb 12 oz (74.277 kg)  BMI 25.64 kg/m2  Physical Exam  Constitutional: He is oriented to person, place, and time. He appears well-developed and well-nourished.  HENT:  Head: Normocephalic.  Nose: Nose normal.  Mouth/Throat: Oropharynx is clear and moist.  Eyes: Conjunctivae are normal. Pupils are equal, round, and reactive to light.  Neck: Normal range of motion. Neck supple. No JVD present.  Cardiovascular: Normal rate, regular rhythm, S1 normal, S2 normal, normal heart sounds and intact distal pulses.  Exam reveals no gallop and no friction rub.   No murmur heard. Pulmonary/Chest: Effort normal and breath sounds normal. No respiratory distress. He has no wheezes. He has no rales. He exhibits no tenderness.  Abdominal: Soft. Bowel sounds are normal. He exhibits no distension. There is no tenderness.  Musculoskeletal: Normal range of motion. He exhibits no edema or tenderness.  Lymphadenopathy:    He has no  cervical adenopathy.  Neurological: He is alert and oriented to person, place, and time. Coordination normal.  Skin: Skin is warm and dry. No rash noted. No erythema.  Psychiatric: He has a normal mood and affect. His behavior is normal. Judgment and thought content normal.      Assessment and Plan   Nursing note and vitals reviewed.

## 2014-11-17 NOTE — Assessment & Plan Note (Signed)
Moderate carotid arterial disease. Will recommend recheck ultrasound 2016. He does not want a statin

## 2014-11-17 NOTE — Assessment & Plan Note (Signed)
Currently with no symptoms of angina. No further workup at this time. Continue current medication regimen. 

## 2014-11-17 NOTE — Assessment & Plan Note (Addendum)
After long discussion, he does not want a cholesterol medication. Lab work orders for lipids and LFTs have been ordered. He will take these with him and do them in the next week, fasting to our attention

## 2014-11-18 ENCOUNTER — Other Ambulatory Visit: Payer: Self-pay | Admitting: Cardiovascular Disease

## 2014-11-18 DIAGNOSIS — I25119 Atherosclerotic heart disease of native coronary artery with unspecified angina pectoris: Secondary | ICD-10-CM | POA: Diagnosis not present

## 2014-11-19 LAB — LIPID PANEL W/O CHOL/HDL RATIO
CHOLESTEROL TOTAL: 185 mg/dL (ref 100–199)
HDL: 33 mg/dL — AB (ref >39)
LDL Calculated: 127 mg/dL — ABNORMAL HIGH (ref 0–99)
Triglycerides: 125 mg/dL (ref 0–149)
VLDL CHOLESTEROL CAL: 25 mg/dL (ref 5–40)

## 2014-11-19 LAB — HEPATIC FUNCTION PANEL: Bilirubin, Direct: 0.13 mg/dL (ref 0.00–0.40)

## 2014-12-24 DIAGNOSIS — H2513 Age-related nuclear cataract, bilateral: Secondary | ICD-10-CM | POA: Diagnosis not present

## 2015-01-23 ENCOUNTER — Other Ambulatory Visit: Payer: Self-pay | Admitting: Cardiovascular Disease

## 2015-01-26 ENCOUNTER — Other Ambulatory Visit: Payer: Self-pay | Admitting: Cardiovascular Disease

## 2015-10-11 ENCOUNTER — Inpatient Hospital Stay (HOSPITAL_COMMUNITY)
Admission: EM | Admit: 2015-10-11 | Discharge: 2015-10-13 | DRG: 052 | Disposition: A | Discharge status: Home / Self Care | Payer: Medicare Other | Attending: Internal Medicine | Admitting: Internal Medicine

## 2015-10-11 ENCOUNTER — Emergency Department (HOSPITAL_COMMUNITY): Payer: Medicare Other

## 2015-10-11 ENCOUNTER — Inpatient Hospital Stay (HOSPITAL_COMMUNITY): Payer: Medicare Other

## 2015-10-11 ENCOUNTER — Encounter (HOSPITAL_COMMUNITY): Payer: Self-pay

## 2015-10-11 DIAGNOSIS — M4802 Spinal stenosis, cervical region: Secondary | ICD-10-CM | POA: Diagnosis not present

## 2015-10-11 DIAGNOSIS — I2510 Atherosclerotic heart disease of native coronary artery without angina pectoris: Secondary | ICD-10-CM | POA: Diagnosis present

## 2015-10-11 DIAGNOSIS — I4891 Unspecified atrial fibrillation: Secondary | ICD-10-CM | POA: Diagnosis present

## 2015-10-11 DIAGNOSIS — N189 Chronic kidney disease, unspecified: Secondary | ICD-10-CM | POA: Diagnosis present

## 2015-10-11 DIAGNOSIS — I10 Essential (primary) hypertension: Secondary | ICD-10-CM | POA: Diagnosis not present

## 2015-10-11 DIAGNOSIS — R531 Weakness: Secondary | ICD-10-CM | POA: Diagnosis not present

## 2015-10-11 DIAGNOSIS — I129 Hypertensive chronic kidney disease with stage 1 through stage 4 chronic kidney disease, or unspecified chronic kidney disease: Secondary | ICD-10-CM | POA: Diagnosis present

## 2015-10-11 DIAGNOSIS — Z79899 Other long term (current) drug therapy: Secondary | ICD-10-CM

## 2015-10-11 DIAGNOSIS — I6330 Cerebral infarction due to thrombosis of unspecified cerebral artery: Secondary | ICD-10-CM | POA: Diagnosis not present

## 2015-10-11 DIAGNOSIS — Z951 Presence of aortocoronary bypass graft: Secondary | ICD-10-CM | POA: Diagnosis not present

## 2015-10-11 DIAGNOSIS — I6789 Other cerebrovascular disease: Secondary | ICD-10-CM

## 2015-10-11 DIAGNOSIS — S14129A Central cord syndrome at unspecified level of cervical spinal cord, initial encounter: Principal | ICD-10-CM | POA: Diagnosis present

## 2015-10-11 DIAGNOSIS — S14124A Central cord syndrome at C4 level of cervical spinal cord, initial encounter: Secondary | ICD-10-CM | POA: Diagnosis not present

## 2015-10-11 DIAGNOSIS — E785 Hyperlipidemia, unspecified: Secondary | ICD-10-CM | POA: Diagnosis not present

## 2015-10-11 DIAGNOSIS — I639 Cerebral infarction, unspecified: Secondary | ICD-10-CM | POA: Diagnosis not present

## 2015-10-11 DIAGNOSIS — M47812 Spondylosis without myelopathy or radiculopathy, cervical region: Secondary | ICD-10-CM | POA: Diagnosis not present

## 2015-10-11 DIAGNOSIS — S14129D Central cord syndrome at unspecified level of cervical spinal cord, subsequent encounter: Secondary | ICD-10-CM | POA: Diagnosis not present

## 2015-10-11 DIAGNOSIS — I6350 Cerebral infarction due to unspecified occlusion or stenosis of unspecified cerebral artery: Secondary | ICD-10-CM | POA: Diagnosis not present

## 2015-10-11 DIAGNOSIS — D649 Anemia, unspecified: Secondary | ICD-10-CM | POA: Diagnosis present

## 2015-10-11 DIAGNOSIS — S14125A Central cord syndrome at C5 level of cervical spinal cord, initial encounter: Secondary | ICD-10-CM | POA: Diagnosis not present

## 2015-10-11 LAB — COMPREHENSIVE METABOLIC PANEL
ALBUMIN: 3.5 g/dL (ref 3.5–5.0)
ALT: 16 U/L — AB (ref 17–63)
AST: 22 U/L (ref 15–41)
Alkaline Phosphatase: 67 U/L (ref 38–126)
Anion gap: 8 (ref 5–15)
BUN: 25 mg/dL — AB (ref 6–20)
CHLORIDE: 108 mmol/L (ref 101–111)
CO2: 22 mmol/L (ref 22–32)
CREATININE: 2.26 mg/dL — AB (ref 0.61–1.24)
Calcium: 8.6 mg/dL — ABNORMAL LOW (ref 8.9–10.3)
GFR calc Af Amer: 29 mL/min — ABNORMAL LOW (ref >60)
GFR calc non Af Amer: 25 mL/min — ABNORMAL LOW (ref >60)
Glucose, Bld: 158 mg/dL — ABNORMAL HIGH (ref 65–99)
POTASSIUM: 3.6 mmol/L (ref 3.5–5.1)
SODIUM: 138 mmol/L (ref 135–145)
Total Bilirubin: 0.4 mg/dL (ref 0.3–1.2)
Total Protein: 6.7 g/dL (ref 6.5–8.1)

## 2015-10-11 LAB — URINALYSIS, ROUTINE W REFLEX MICROSCOPIC
Bilirubin Urine: NEGATIVE
GLUCOSE, UA: NEGATIVE mg/dL
HGB URINE DIPSTICK: NEGATIVE
KETONES UR: NEGATIVE mg/dL
LEUKOCYTES UA: NEGATIVE
Nitrite: NEGATIVE
PH: 7.5 (ref 5.0–8.0)
PROTEIN: 100 mg/dL — AB
Specific Gravity, Urine: 1.012 (ref 1.005–1.030)
Urobilinogen, UA: 0.2 mg/dL (ref 0.0–1.0)

## 2015-10-11 LAB — CBC
HCT: 33.3 % — ABNORMAL LOW (ref 39.0–52.0)
Hemoglobin: 10.9 g/dL — ABNORMAL LOW (ref 13.0–17.0)
MCH: 20.4 pg — ABNORMAL LOW (ref 26.0–34.0)
MCHC: 32.7 g/dL (ref 30.0–36.0)
MCV: 62.4 fL — AB (ref 78.0–100.0)
PLATELETS: 281 10*3/uL (ref 150–400)
RBC: 5.34 MIL/uL (ref 4.22–5.81)
RDW: 16.6 % — AB (ref 11.5–15.5)
WBC: 8.8 10*3/uL (ref 4.0–10.5)

## 2015-10-11 LAB — DIFFERENTIAL
BASOS ABS: 0.1 10*3/uL (ref 0.0–0.1)
BASOS PCT: 1 %
EOS ABS: 0.7 10*3/uL (ref 0.0–0.7)
Eosinophils Relative: 8 %
LYMPHS PCT: 21 %
Lymphs Abs: 1.8 10*3/uL (ref 0.7–4.0)
MONO ABS: 0.6 10*3/uL (ref 0.1–1.0)
Monocytes Relative: 7 %
NEUTROS PCT: 63 %
Neutro Abs: 5.6 10*3/uL (ref 1.7–7.7)

## 2015-10-11 LAB — I-STAT CHEM 8, ED
BUN: 28 mg/dL — AB (ref 6–20)
CALCIUM ION: 1.13 mmol/L (ref 1.13–1.30)
CHLORIDE: 109 mmol/L (ref 101–111)
Creatinine, Ser: 2.3 mg/dL — ABNORMAL HIGH (ref 0.61–1.24)
Glucose, Bld: 159 mg/dL — ABNORMAL HIGH (ref 65–99)
HCT: 36 % — ABNORMAL LOW (ref 39.0–52.0)
Hemoglobin: 12.2 g/dL — ABNORMAL LOW (ref 13.0–17.0)
Potassium: 3.6 mmol/L (ref 3.5–5.1)
SODIUM: 144 mmol/L (ref 135–145)
TCO2: 21 mmol/L (ref 0–100)

## 2015-10-11 LAB — RAPID URINE DRUG SCREEN, HOSP PERFORMED
AMPHETAMINES: NOT DETECTED
BARBITURATES: NOT DETECTED
BENZODIAZEPINES: NOT DETECTED
COCAINE: NOT DETECTED
Opiates: NOT DETECTED
Tetrahydrocannabinol: NOT DETECTED

## 2015-10-11 LAB — LIPID PANEL
Cholesterol: 163 mg/dL (ref 0–200)
HDL: 33 mg/dL — ABNORMAL LOW (ref >40)
LDL CALC: 118 mg/dL — AB (ref 0–99)
Total CHOL/HDL Ratio: 4.9 RATIO
Triglycerides: 59 mg/dL (ref <150)
VLDL: 12 mg/dL (ref 0–40)

## 2015-10-11 LAB — URINE MICROSCOPIC-ADD ON

## 2015-10-11 LAB — MRSA PCR SCREENING: MRSA BY PCR: NEGATIVE

## 2015-10-11 LAB — ETHANOL: Alcohol, Ethyl (B): <5 mg/dL (ref <5)

## 2015-10-11 LAB — PROTIME-INR
INR: 1.08 (ref 0.00–1.49)
PROTHROMBIN TIME: 14.2 s (ref 11.6–15.2)

## 2015-10-11 LAB — I-STAT TROPONIN, ED: TROPONIN I, POC: 0 ng/mL (ref 0.00–0.08)

## 2015-10-11 LAB — APTT: APTT: 29 s (ref 24–37)

## 2015-10-11 MED ORDER — ENOXAPARIN SODIUM 30 MG/0.3ML ~~LOC~~ SOLN
30.0000 mg | SUBCUTANEOUS | Status: DC
  Administered 2015-10-12: 30 mg via SUBCUTANEOUS
  Filled 2015-10-11 (×3): qty 0.3

## 2015-10-11 MED ORDER — DEXAMETHASONE SODIUM PHOSPHATE 4 MG/ML IJ SOLN
4.0000 mg | Freq: Four times a day (QID) | INTRAMUSCULAR | Status: DC
  Administered 2015-10-11 – 2015-10-13 (×8): 4 mg via INTRAVENOUS
  Filled 2015-10-11 (×8): qty 1

## 2015-10-11 MED ORDER — ONDANSETRON HCL 4 MG/2ML IJ SOLN: 4.0000 mg | Freq: Four times a day (QID) | INTRAMUSCULAR | Status: DC | PRN

## 2015-10-11 MED ORDER — ASPIRIN 325 MG PO TABS
325.0000 mg | ORAL_TABLET | Freq: Every day | ORAL | Status: DC
  Administered 2015-10-11 – 2015-10-13 (×3): 325 mg via ORAL
  Filled 2015-10-11 (×3): qty 1

## 2015-10-11 MED ORDER — STROKE: EARLY STAGES OF RECOVERY BOOK
Freq: Once | Status: AC
  Administered 2015-10-11: 11:00:00
  Filled 2015-10-11: qty 1

## 2015-10-11 MED ORDER — INFLUENZA VAC SPLIT QUAD 0.5 ML IM SUSY
0.5000 mL | PREFILLED_SYRINGE | INTRAMUSCULAR | Status: DC
  Filled 2015-10-11: qty 0.5

## 2015-10-11 MED ORDER — ATORVASTATIN CALCIUM 40 MG PO TABS
40.0000 mg | ORAL_TABLET | Freq: Every day | ORAL | Status: DC
  Administered 2015-10-11 – 2015-10-12 (×2): 40 mg via ORAL
  Filled 2015-10-11 (×2): qty 1

## 2015-10-11 MED ORDER — ACETAMINOPHEN 325 MG PO TABS: 650.0000 mg | ORAL_TABLET | ORAL | Status: DC | PRN

## 2015-10-11 MED ORDER — SODIUM CHLORIDE 0.9 % IV SOLN
INTRAVENOUS | Status: DC
  Administered 2015-10-11: 07:00:00 via INTRAVENOUS

## 2015-10-11 MED ORDER — DEXAMETHASONE SODIUM PHOSPHATE 10 MG/ML IJ SOLN
10.0000 mg | Freq: Once | INTRAMUSCULAR | Status: AC
  Administered 2015-10-11: 10 mg via INTRAVENOUS
  Filled 2015-10-11: qty 1

## 2015-10-11 MED ORDER — SODIUM CHLORIDE 0.9 % IV SOLN: 250.0000 mL | INTRAVENOUS | Status: DC | PRN

## 2015-10-11 NOTE — Progress Notes (Signed)
  Echocardiogram 2D Echocardiogram has been performed.  Bobbye Charleston 10/11/2015, 4:41 PM

## 2015-10-11 NOTE — H&P (Addendum)
PULMONARY / CRITICAL CARE MEDICINE   Name: Jake Garcia MRN: [id number] DOB: 1929/11/19    ADMISSION DATE:  10/11/2015 CONSULTATION DATE:  10/11/15  REFERRING MD :  Claudine Mouton  CHIEF COMPLAINT:  Inability to move arms  INITIAL PRESENTATION: ED   HISTORY OF PRESENT ILLNESS:  81yo caucasian male with PMHx of CAD, HTN, HLD who presented to the ED with sudden onset of bilateral upper extremity weakness. Per patient, this is of sudden onset where he went to sleep last night at 8pm and awoke with the inability to move his arms. Per report, he had been painting over his head. His sensation is intact but has motor dysfunction with a slight improvement in his right upper extremity.   PAST MEDICAL HISTORY :   has a past medical history of Coronary artery disease; Hyperlipidemia; and Hypertension.  has past surgical history that includes Coronary artery bypass graft (11/24/2008) and Cardiac catheterization (11/20/2008). Prior to Admission medications   Medication Sig Start Date End Date Taking? Authorizing Provider  amLODipine (NORVASC) 10 MG tablet TAKE ONE TABLET BY MOUTH ONCE DAILY 01/26/15  Yes Minna Merritts, MD  CALCIUM PO Take 1 tablet by mouth daily.   Yes Historical Provider, MD  MAGNESIUM PO Take 1 tablet by mouth daily.   Yes Historical Provider, MD  Multiple Vitamins-Minerals (EYE VITAMINS PO) Take 1 tablet by mouth daily.   Yes Historical Provider, MD  Multiple Vitamins-Minerals (ZINC PO) Take 1 tablet by mouth daily.   Yes Historical Provider, MD  metoprolol succinate (TOPROL-XL) 25 MG 24 hr tablet TAKE ONE TABLET BY MOUTH ONCE DAILY Patient not taking: Reported on 10/11/2015 01/26/15   Minna Merritts, MD   No Known Allergies  FAMILY HISTORY:  indicated that his mother is deceased. He indicated that his father is deceased.  SOCIAL HISTORY:  reports that he has never smoked. He does not have any smokeless tobacco history on file. He reports that he does not drink alcohol or use illicit  drugs.  REVIEW OF SYSTEMS:   Constitutional: Negative for fever, chills, weight loss, malaise/fatigue and diaphoresis.  HENT: Negative for hearing loss, ear pain, nosebleeds, congestion, sore throat, neck pain, tinnitus and ear discharge.   Eyes: Negative for blurred vision, double vision, photophobia, pain, discharge and redness.  Respiratory: Negative for cough, hemoptysis, sputum production, shortness of breath, wheezing and stridor.   Cardiovascular: Negative for chest pain, palpitations, orthopnea, claudication, leg swelling and PND.  Gastrointestinal: Negative for heartburn, nausea, vomiting, abdominal pain, diarrhea, constipation, blood in stool and melena.  Genitourinary: Negative for dysuria, urgency, frequency, hematuria and flank pain.  Musculoskeletal: Negative for myalgias, back pain, joint pain and falls.  Skin: Negative for itching and rash.  Neurological: Negative for dizziness,+ tingling,- tremors, sensory change, speech change,+ focal weakness, - seizures, loss of consciousness, weakness and headaches.  Endo/Heme/Allergies: Negative for environmental allergies and polydipsia. Does not bruise/bleed easily.   SUBJECTIVE:   VITAL SIGNS: Temp:  [97.5 F (36.4 C)-97.8 F (36.6 C)] 97.8 F (36.6 C) (10/23 0615) Pulse Rate:  [35-68] 68 (10/23 0600) Resp:  [13-22] 16 (10/23 0600) BP: (162-183)/(64-95) 176/95 mmHg (10/23 0600) SpO2:  [96 %-100 %] 96 % (10/23 0600) Weight:  [72.576 kg (160 lb)] 72.576 kg (160 lb) (10/23 0320) HEMODYNAMICS:   VENTILATOR SETTINGS:   INTAKE / OUTPUT: No intake or output data in the 24 hours ending 10/11/15 [id number]  PHYSICAL EXAMINATION: General:  81yo caucasian male who appears his stated age and appears distressed by his  current issue. He is alert, awake, well nourished and well developed.  Neuro: AAOx4 Cranial nerves II-XII grossly intact. PERRL. EOMI. Sensation intact in all extremities. Left upper extremities is plegic. Right upper  extremity with 2/5 in the shoulder, 2/5 in elbow, 1/5 in wrist. Patient unable to squeeze  HEENT: Head, normocephalic, atraumatic. Ears symmetric, permeable. Eyes: no conjunctival icterus, no erythema. Nose: permeable, midline septum Clear oropharynx fair dentition. Cardiovascular: S1S2 RRR no murmurs, rubs, or gallops auscultated. No thrills palpated.  Lungs:  Chest symmetrical with respirations, clear to auscultation bilaterally with no wheezing, crackles, equal expansion.  Abdomen:  Soft, nontender, no guarding, nondistended. Present bowel sounds. Musculoskeletal:  No muscle atrophy noted + weakness in bilateral upper extremities. No swelling nor tenderness of the joints.  Skin:  No notable scars, rashes, cruises. No bed sores noted.  Lymphatics: no palpable lymphadenopathy of cervical, supraclvicular nor inguinal areas.     LABS:  CBC  Recent Labs Lab 10/11/15 0321 10/11/15 0329  WBC 8.8  --   HGB 10.9* 12.2*  HCT 33.3* 36.0*  PLT 281  --    Coag's  Recent Labs Lab 10/11/15 0321  APTT 29  INR 1.08   BMET  Recent Labs Lab 10/11/15 0321 10/11/15 0329  NA 138 144  K 3.6 3.6  CL 108 109  CO2 22  --   BUN 25* 28*  CREATININE 2.26* 2.30*  GLUCOSE 158* 159*   Electrolytes  Recent Labs Lab 10/11/15 0321  CALCIUM 8.6*   Sepsis Markers No results for input(s): LATICACIDVEN, PROCALCITON, O2SATVEN in the last 168 hours. ABG No results for input(s): PHART, PCO2ART, PO2ART in the last 168 hours. Liver Enzymes  Recent Labs Lab 10/11/15 0321  AST 22  ALT 16*  ALKPHOS 67  BILITOT 0.4  ALBUMIN 3.5   Cardiac Enzymes No results for input(s): TROPONINI, PROBNP in the last 168 hours. Glucose No results for input(s): GLUCAP in the last 168 hours.  Imaging Mr Brain Wo Contrast  10/11/2015  CLINICAL DATA:  Initial evaluation to move bilateral upper extremities, acute onset. EXAM: MRI HEAD WITHOUT CONTRAST TECHNIQUE: Multiplanar, multiecho pulse sequences of the  brain and surrounding structures were obtained without intravenous contrast. COMPARISON:  Prior study from 06/30/2010. FINDINGS: Punctate 5 mm ischemic infarct within the right caudate head. Additional tiny 8 mm cortical infarct within the right parietal lobe. No associated hemorrhage or mass effect. No other infarct. Normal intravascular flow voids are preserved. Diffuse prominence of the CSF containing spaces is compatible with generalized age-related cerebral atrophy. Patchy T2/FLAIR hyperintensity within the periventricular and deep white matter most consistent with chronic small vessel ischemic disease. Remote lacunar infarcts present within the bilateral thalami. Small remote lacunar infarct within the left caudate head. Multiple remote bilateral cerebellar infarcts present as well. Small remote lacunar infarct within the anterior right corona radiata/centrum semi ovale with associated chronic micro hemorrhage present as well. No mass lesion, midline shift, or mass effect. Ventricular prominence related to global parenchymal volume loss present without hydrocephalus. No extra-axial fluid collection. Craniocervical junction within normal limits. Pituitary gland normal.  No acute abnormality about the orbits. Scattered mucosal thickening with throughout the paranasal sinuses. No mastoid effusion. Inner ear structures grossly normal. Bone marrow signal intensity within normal limits. No scalp soft tissue abnormality. IMPRESSION: 1. Punctate 5 mm acute ischemic nonhemorrhagic infarct within the right caudate head. 2. Small 8 mm cortical ischemic nonhemorrhagic infarct within the right parietal lobe. 3. Atrophy with mild chronic small vessel ischemic disease  and multiple remote lacunar infarcts as above. Electronically Signed   By: Jeannine Boga M.D.   On: 10/11/2015 05:37   Mr Cervical Spine Wo Contrast  10/11/2015  CLINICAL DATA:  Initial evaluation for acute onset inability to move bilateral upper  extremities. EXAM: MRI CERVICAL SPINE WITHOUT CONTRAST TECHNIQUE: Multiplanar, multisequence MR imaging of the cervical spine was performed. No intravenous contrast was administered. COMPARISON:  None. FINDINGS: Visualized portions of the brain and posterior fossa demonstrate no acute abnormality. Craniocervical junction widely patent. Straightening with slight reversal of the normal cervical lordosis with apex at C4. There is 3 mm anterolisthesis of C7 on T1. Trace retrolisthesis of C5 on C6. This is likely chronic in nature. No fracture. Signal intensity within the vertebral body bone marrow is normal. No marrow edema. No focal osseous lesion. Signal intensity within the cervical spinal cord is normal. No paraspinous soft tissue abnormality.  No prevertebral edema. C2-C3: Mild bilateral uncovertebral hypertrophy, slightly worse on the right. Superimposed right-sided facet arthrosis. There is resultant moderate right foraminal stenosis. No significant left foraminal narrowing. Central canal is widely patent. C3-C4: Diffuse degenerative disc osteophyte with bilateral uncovertebral hypertrophy. Left greater than right facet arthrosis. There is resultant moderate to severe left with moderate right foraminal stenosis. Broad-based posterior disc bulge indents and largely effaces the ventral thecal sac and results in moderate canal narrowing. C4-C5: Diffuse degenerative disc osteophyte with intervertebral disc space narrowing and bilateral uncovertebral hypertrophy. Mild bilateral facet arthrosis. There is resultant fairly severe bilateral foraminal stenosis. Broad-based posterior central/left paracentral disc osteophyte flattens and largely effaces the ventral thecal sac and results in moderate canal stenosis. Minimal flattening of the cervical spinal cord without cord signal changes. C5-C6: There is a predominantly central/ right paracentral disc osteophyte complex that indents the right ventral thecal sac and flattens  the right aspect of the cervical spinal cord. No cord signal changes. Superimposed bilateral uncovertebral hypertrophy and facet arthrosis. There is resultant severe right with moderate left foraminal narrowing. Moderate canal stenosis. C6-C7: Broad-based degenerative disc osteophyte with mild uncovertebral hypertrophy and prominent intervertebral disc space narrowing. There is resultant moderate bilateral foraminal stenosis, slightly worse on the left. Broad-based posterior disc osteophyte mildly effaces the ventral thecal sac and results in mild to moderate canal stenosis. C7-T1: 3 mm anterolisthesis of C7 on T1. Secondary mild diffuse disc bulge without focal disc herniation. Ligamentum flavum hypertrophy and bilateral facet arthrosis. There is resultant mild left foraminal stenosis. No significant right foraminal canal stenosis. Visualized upper thoracic spine demonstrates no acute abnormality. IMPRESSION: 1. No acute abnormality within the cervical spine. 2. 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 There is resultant moderate canal stenosis at C3-4 through C6-7. Please see above report for a full description of these findings. Electronically Signed   By: Jeannine Boga M.D.   On: 10/11/2015 06:07     ASSESSMENT / PLAN:  PULMONARY OETT A:No active issues  P:     CARDIOVASCULAR CVL A: Hypertension Coronary Artery Disease P: goal is to keep MAP >75-85. Could provide pressors to augment this.   RENAL A:  Chronic Kidney Disease, unknown stage. Creat 1.8 12/2011 P:  IVF @ 75cc/hr for hydration, discontinue once patient is eating   GASTROINTESTINAL A:  No active issues P:    HEMATOLOGIC A:  Normocytic Anemia P:   INFECTIOUS A:  No active issues P:  ENDOCRINE A:  No active issues   P:  Monitor blood glucose levels for steroid induced  hyperglycemia  NEUROLOGIC A:  Possible central cord syndrome Acute CVA, punctate in the right caudate head, 14mm cortical ischemic infarct in the right parietal lobe. P:  F/u neurosurgery and neurology recommendations. Care discussed with neurosurgery and I appreciate the assistance. We will provide decadron 10mg  iv x 1 and 4mg  IV q6h thereafter. ASA, statin. Hemodynamic augmentation to keep MAP >75-85. Patient is doing this on his own for now. Made son aware that patient may need a central line.   FAMILY  - Updates: care discussed with patient, wife, others in the room and son [name] who is a physician and was inquiring on whether we should be administering steroids based on literature he has read.   - Inter-disciplinary family meet or Palliative Care meeting due by:  day 7  Critical care time 31 minutes. Pt may need titration of vasopressors, continuous monitoring of vital signs, stroke workup, family discussions   Randa Lynn, MD [place] Pager: [phone]  10/11/2015, 7:38 AM

## 2015-10-11 NOTE — ED Provider Notes (Addendum)
CSN: [id number]     Arrival date & time 10/11/15  [id number] History  By signing my name below, I, Jake Garcia, attest that this documentation has been prepared under the direction and in the presence of Everlene Balls, MD. Electronically Signed: Julien Nordmann, ED Scribe. 10/11/2015. 3:35 AM.    Chief Complaint  Patient presents with  . unable to move arms       The history is provided by the patient. No language interpreter was used.   HPI Comments: Jake Garcia is a 81 y.o. male who presents to the Emergency Department complaining of sudden inability to move bilateral extremities onset one hour ago. Pt reports waking up about one hour ago with the inability to move his bilateral upper extremities below the elbow. He states there is no pain and he is able to shrug his shoulders. Pt is able to move both of his legs and he is ambulatory. He notes nothing makes his pain better or worse. Pt denies any new medications and medical changes. He does display any other neurological symptoms.   Past Medical History  Diagnosis Date  . Coronary artery disease   . Hyperlipidemia   . Hypertension    Past Surgical History  Procedure Laterality Date  . Coronary artery bypass graft  11/24/2008  . Cardiac catheterization  11/20/2008   Family History  Problem Relation Age of Onset  . Family history unknown: Yes   Social History  Substance Use Topics  . Smoking status: Never Smoker   . Smokeless tobacco: None  . Alcohol Use: No    Review of Systems  A complete 10 system review of systems was obtained and all systems are negative except as noted in the HPI and PMH.    Allergies  Review of patient's allergies indicates no known allergies.  Home Medications   Prior to Admission medications   Medication Sig Start Date End Date Taking? Authorizing Provider  amLODipine (NORVASC) 10 MG tablet TAKE ONE TABLET BY MOUTH ONCE DAILY 01/26/15   Minna Merritts, MD  metoprolol succinate (TOPROL-XL) 25 MG 24  hr tablet TAKE ONE TABLET BY MOUTH ONCE DAILY 01/26/15   Minna Merritts, MD   Triage vitals: BP 169/69 mmHg  Pulse 68  Temp(Src) 97.5 F (36.4 C) (Oral)  Resp 13  Ht 5\' 8"  (1.727 m)  Wt 160 lb (72.576 kg)  BMI 24.33 kg/m2  SpO2 100% Physical Exam  Constitutional: He is oriented to person, place, and time. Vital signs are normal. He appears well-developed and well-nourished.  Non-toxic appearance. He does not appear ill. No distress.  HENT:  Head: Normocephalic and atraumatic.  Nose: Nose normal.  Mouth/Throat: Oropharynx is clear and moist. No oropharyngeal exudate.  Eyes: Conjunctivae and EOM are normal. Pupils are equal, round, and reactive to light. No scleral icterus.  Neck: Normal range of motion. Neck supple. No tracheal deviation, no edema, no erythema and normal range of motion present. No thyroid mass and no thyromegaly present.  Cardiovascular: Normal rate, regular rhythm, S1 normal, S2 normal, normal heart sounds, intact distal pulses and normal pulses.  Exam reveals no gallop and no friction rub.   No murmur heard. Pulses:      Radial pulses are 2+ on the right side, and 2+ on the left side.       Dorsalis pedis pulses are 2+ on the right side, and 2+ on the left side.  Pulmonary/Chest: Effort normal and breath sounds normal. No respiratory distress. He has  no wheezes. He has no rhonchi. He has no rales.  Abdominal: Soft. Normal appearance and bowel sounds are normal. He exhibits no distension, no ascites and no mass. There is no hepatosplenomegaly. There is no tenderness. There is no rebound, no guarding and no CVA tenderness.  Musculoskeletal: Normal range of motion. He exhibits no edema or tenderness.  Lymphadenopathy:    He has no cervical adenopathy.  Neurological: He is alert and oriented to person, place, and time. He has normal strength. No cranial nerve deficit or sensory deficit.  0/5 strength bilateral upper extremities 5/5 strength bilateral lower  extremities Lower heel to shin bilaterally Normal gait  Skin: Skin is warm, dry and intact. No petechiae and no rash noted. He is not diaphoretic. No erythema. No pallor.  Psychiatric: He has a normal mood and affect. His behavior is normal. Judgment normal.  Nursing note and vitals reviewed.   ED Course  Procedures  DIAGNOSTIC STUDIES: Oxygen Saturation is 100% on RA, normal by my interpretation.  COORDINATION OF CARE:  3:33 AM Discussed treatment plan which includes MRI with pt at bedside and pt agreed to plan.  Labs Review Labs Reviewed  CBC - Abnormal; Notable for the following:    Hemoglobin 10.9 (*)    HCT 33.3 (*)    MCV 62.4 (*)    MCH 20.4 (*)    RDW 16.6 (*)    All other components within normal limits  COMPREHENSIVE METABOLIC PANEL - Abnormal; Notable for the following:    Glucose, Bld 158 (*)    BUN 25 (*)    Creatinine, Ser 2.26 (*)    Calcium 8.6 (*)    ALT 16 (*)    GFR calc non Af Amer 25 (*)    GFR calc Af Amer 29 (*)    All other components within normal limits  URINALYSIS, ROUTINE W REFLEX MICROSCOPIC (NOT AT [facility]) - Abnormal; Notable for the following:    APPearance CLOUDY (*)    Protein, ur 100 (*)    All other components within normal limits  I-STAT CHEM 8, ED - Abnormal; Notable for the following:    BUN 28 (*)    Creatinine, Ser 2.30 (*)    Glucose, Bld 159 (*)    Hemoglobin 12.2 (*)    HCT 36.0 (*)    All other components within normal limits  PROTIME-INR  APTT  DIFFERENTIAL  URINE RAPID DRUG SCREEN, HOSP PERFORMED  URINE MICROSCOPIC-ADD ON  ETHANOL  Randolm Idol, ED    Imaging Review Mr Brain Wo Contrast  10/11/2015  CLINICAL DATA:  Initial evaluation to move bilateral upper extremities, acute onset. EXAM: MRI HEAD WITHOUT CONTRAST TECHNIQUE: Multiplanar, multiecho pulse sequences of the brain and surrounding structures were obtained without intravenous contrast. COMPARISON:  Prior study from 06/30/2010. FINDINGS: Punctate 5 mm  ischemic infarct within the right caudate head. Additional tiny 8 mm cortical infarct within the right parietal lobe. No associated hemorrhage or mass effect. No other infarct. Normal intravascular flow voids are preserved. Diffuse prominence of the CSF containing spaces is compatible with generalized age-related cerebral atrophy. Patchy T2/FLAIR hyperintensity within the periventricular and deep white matter most consistent with chronic small vessel ischemic disease. Remote lacunar infarcts present within the bilateral thalami. Small remote lacunar infarct within the left caudate head. Multiple remote bilateral cerebellar infarcts present as well. Small remote lacunar infarct within the anterior right corona radiata/centrum semi ovale with associated chronic micro hemorrhage present as well. No mass lesion, midline shift,  or mass effect. Ventricular prominence related to global parenchymal volume loss present without hydrocephalus. No extra-axial fluid collection. Craniocervical junction within normal limits. Pituitary gland normal.  No acute abnormality about the orbits. Scattered mucosal thickening with throughout the paranasal sinuses. No mastoid effusion. Inner ear structures grossly normal. Bone marrow signal intensity within normal limits. No scalp soft tissue abnormality. IMPRESSION: 1. Punctate 5 mm acute ischemic nonhemorrhagic infarct within the right caudate head. 2. Small 8 mm cortical ischemic nonhemorrhagic infarct within the right parietal lobe. 3. Atrophy with mild chronic small vessel ischemic disease and multiple remote lacunar infarcts as above. Electronically Signed   By: Jeannine Boga M.D.   On: 10/11/2015 05:37   Mr Cervical Spine Wo Contrast  10/11/2015  CLINICAL DATA:  Initial evaluation for acute onset inability to move bilateral upper extremities. EXAM: MRI CERVICAL SPINE WITHOUT CONTRAST TECHNIQUE: Multiplanar, multisequence MR imaging of the cervical spine was performed. No  intravenous contrast was administered. COMPARISON:  None. FINDINGS: Visualized portions of the brain and posterior fossa demonstrate no acute abnormality. Craniocervical junction widely patent. Straightening with slight reversal of the normal cervical lordosis with apex at C4. There is 3 mm anterolisthesis of C7 on T1. Trace retrolisthesis of C5 on C6. This is likely chronic in nature. No fracture. Signal intensity within the vertebral body bone marrow is normal. No marrow edema. No focal osseous lesion. Signal intensity within the cervical spinal cord is normal. No paraspinous soft tissue abnormality.  No prevertebral edema. C2-C3: Mild bilateral uncovertebral hypertrophy, slightly worse on the right. Superimposed right-sided facet arthrosis. There is resultant moderate right foraminal stenosis. No significant left foraminal narrowing. Central canal is widely patent. C3-C4: Diffuse degenerative disc osteophyte with bilateral uncovertebral hypertrophy. Left greater than right facet arthrosis. There is resultant moderate to severe left with moderate right foraminal stenosis. Broad-based posterior disc bulge indents and largely effaces the ventral thecal sac and results in moderate canal narrowing. C4-C5: Diffuse degenerative disc osteophyte with intervertebral disc space narrowing and bilateral uncovertebral hypertrophy. Mild bilateral facet arthrosis. There is resultant fairly severe bilateral foraminal stenosis. Broad-based posterior central/left paracentral disc osteophyte flattens and largely effaces the ventral thecal sac and results in moderate canal stenosis. Minimal flattening of the cervical spinal cord without cord signal changes. C5-C6: There is a predominantly central/ right paracentral disc osteophyte complex that indents the right ventral thecal sac and flattens the right aspect of the cervical spinal cord. No cord signal changes. Superimposed bilateral uncovertebral hypertrophy and facet arthrosis.  There is resultant severe right with moderate left foraminal narrowing. Moderate canal stenosis. C6-C7: Broad-based degenerative disc osteophyte with mild uncovertebral hypertrophy and prominent intervertebral disc space narrowing. There is resultant moderate bilateral foraminal stenosis, slightly worse on the left. Broad-based posterior disc osteophyte mildly effaces the ventral thecal sac and results in mild to moderate canal stenosis. C7-T1: 3 mm anterolisthesis of C7 on T1. Secondary mild diffuse disc bulge without focal disc herniation. Ligamentum flavum hypertrophy and bilateral facet arthrosis. There is resultant mild left foraminal stenosis. No significant right foraminal canal stenosis. Visualized upper thoracic spine demonstrates no acute abnormality. IMPRESSION: 1. No acute abnormality within the cervical spine. 2. 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 There is resultant moderate canal stenosis at C3-4 through C6-7. Please see above report for a full description of these findings. Electronically Signed   By: Pincus Badder.D.  On: 10/11/2015 06:07   I have personally reviewed and evaluated these images and lab results as part of my medical decision-making.   EKG Interpretation   Date/Time:  Sunday October 11 2015 03:19:44 EDT Ventricular Rate:  62 PR Interval:  257 QRS Duration: 104 QT Interval:  456 QTC Calculation: 463 R Axis:   -48 Text Interpretation:  Normal sinus rhythm Premature atrial complexes  Incomplete RBBB and LAFB Abnormal R-wave progression, early transition No  significant change since last tracing Confirmed by Glynn Octave  [id number]) on 10/11/2015 3:41:24 AM      MDM   Final diagnoses:  None   patient presents emergency department for evaluation of inability to move his bilateral upper extremities. I  merely discussed the case with Dr. Leonel Ramsay who agrees this does not represent a stroke. I've ordered an MRI of brain and C-spine for evaluation for his weakness.  MRI reveals 2 small acute strokes in the brain. These do not represent areas that would cause bilateral upper extremity weakness, these are incidental finding. MRI of C-spine shows severe cord compression at multiple levels. This is consistent with his weakness. I spoke with Dr. Leonel Ramsay who advises me to consult with neurosurgery. I spoke with Dr. Cyndy Freeze, who states the history is abnormal for central cord syndrome however his presentation is consistent with it. He recommends for ICU admission with MAP pushes and goal map 85-90. Him see the patient for evaluation.    I, Aragorn Recker, personally performed the services described in this documentation. All medical record entries made by the scribe were at my direction and in my presence.  I have reviewed the chart and discharge instructions and agree that the record reflects my personal performance and is accurate and complete. Keeli Roberg.  10/11/2015. 4:25 AM.       CRITICAL CARE Performed by: Everlene Balls   Total critical care time: 45 min - central cord syndrome  Critical care time was exclusive of separately billable procedures and treating other patients.  Critical care was necessary to treat or prevent imminent or life-threatening deterioration.  Critical care was time spent personally by me on the following activities: development of treatment plan with patient and/or surrogate as well as nursing, discussions with consultants, evaluation of patient's response to treatment, examination of patient, obtaining history from patient or surrogate, ordering and performing treatments and interventions, ordering and review of laboratory studies, ordering and review of radiographic studies, pulse oximetry and re-evaluation of patient's condition.   Everlene Balls, MD 10/11/15 [id number]

## 2015-10-11 NOTE — Consult Note (Addendum)
Neurology Consultation Reason for Consult: Upper extremity weakness Referring Physician: Claudine Mouton, A  CC: Upper extremity weakness  History is obtained from:[facility]  HPI: Jake Garcia is a 81 y.o. male with a history of coronary artery disease, hypertension, hyperlipidemia who presents with bilateral upper extremity weakness. He worked on [place] over the past week much of it done above his head with his neck extended, but finished this on Friday. This evening, he was unable to sleep and was thrashing around in this bed and thinks that he was contorting his neck and hyperextending it in efforts to get comfortable.  He then noticed that he was having trouble using his arm and called to his wife. On arrival, she found him unable to use his arms and he was brought into the emergency room. Since being in the emergency room, he has had some improvement in his right arm but still has severe weakness and is completely plegic in his left arm.  LKW: 8:30 PM tpa given?: no, stroke not suspected, incidental finding, out of window  ROS: A 14 point ROS was performed and is negative except as noted in the HPI.   Past Medical History  Diagnosis Date  . Coronary artery disease   . Hyperlipidemia   . Hypertension      Family History  Problem Relation Age of Onset  . Family history unknown: Yes     Social History:  reports that he has never smoked. He does not have any smokeless tobacco history on file. He reports that he does not drink alcohol or use illicit drugs.   Exam: Current vital signs: BP 176/95 mmHg  Pulse 68  Temp(Src) 97.8 F (36.6 C) (Oral)  Resp 16  Ht 5\' 8"  (1.727 m)  Wt 72.576 kg (160 lb)  BMI 24.33 kg/m2  SpO2 96% Vital signs in last 24 hours: Temp:  [97.5 F (36.4 C)-97.8 F (36.6 C)] 97.8 F (36.6 C) (10/23 0615) Pulse Rate:  [35-68] 68 (10/23 0600) Resp:  [13-22] 16 (10/23 0600) BP: (162-183)/(64-95) 176/95 mmHg (10/23 0600) SpO2:  [96 %-100 %] 96 % (10/23  0600) Weight:  [72.576 kg (160 lb)] 72.576 kg (160 lb) (10/23 0320)   Physical Exam  Constitutional: Appears well-developed and well-nourished.  Psych: Affect appropriate to situation Eyes: No scleral injection HENT: No OP obstrucion Head: Normocephalic.  Cardiovascular: Normal rate and regular rhythm.  Respiratory: Effort normal and breath sounds normal to anterior ascultation GI: Soft.  No distension. There is no tenderness.  Skin: WDI  Neuro: Mental Status: Patient is awake, alert, oriented to person, place, month, year, and situation. Patient is able to give a clear and coherent history. No signs of aphasia or neglect Cranial Nerves: II: Visual Fields are full. Pupils are equal, round, and reactive to light.   III,IV, VI: EOMI without ptosis or diploplia.  V: Facial sensation is symmetric to temperature VII: Facial movement is symmetric.  VIII: hearing is intact to voice X: Uvula elevates symmetrically XI: Shoulder shrug is symmetric. XII: tongue is midline without atrophy or fasciculations.  Motor: Tone is normal. Bulk is normal. 5/5 strength was present in bilateral legs.      Right  Left Shoulder Abduction  2/5  1/5 Elbow Flexion   3/5  0/5 Elbow Extension  1/5  0/5 Wrist Flexion   1/5  0/5 Wrist Extension  1/5  0/5 Interossei   0/5  0/5       Sensory: Sensation is diminished to mid upper arm bilaterally.  Deep Tendon Reflexes: Reflexes are diminished in bilateral arms, intact in patellae.  Plantars: Toes are downgoing bilaterally.  Cerebellar: Unable to perform FNF, no ataxia in legs.    I have reviewed labs in epic and the results pertinent to this consultation are: Elevated creatinine  I have reviewed the images obtained: MRI brain-punctate infarcts on the right MRI cervical spine he does have fairly severe stenosis  Impression: 81 year old male with acute onset bilateral upper extremity distal greater than proximal weakness and numbness. Given that  he does have some stenosis, I think that central cord syndrome due to neck positioning would be better for the most likely. Cord infarct should cause lower extremity symptoms as well, and acute flaccid myelitis would typically be visible on MRI and not cause sensory changes.   The infarcts found on brain MRI I feel are incidental and unrelated. That being said, I would favor workup for secondary stroke prevention to avoid symptomatic stroke.  Recommendations: 1. HgbA1c, fasting lipid panel 2. Frequent neuro checks 3. Echocardiogram 4. Carotid dopplers 5. Prophylactic therapy-Antiplatelet med: Aspirin - dose 325mg  PO or 300mg  PR, if no acute surgery 6. Risk factor modification 7. Telemetry monitoring 8. PT consult, OT consult  Roland Rack, MD [facility] [phone]  If 7pm- 7am, please page neurology on call as listed in [place].

## 2015-10-11 NOTE — Consult Note (Signed)
CC:  Chief Complaint  Patient presents with  . Numbness    HPI: Jake Garcia is a 81 y.o. male who awoke today with bilateral upper extremity weakness and some numbness in his hands. He states that he was entirely normal when he went to bed. He denies any recent trauma. He states that last week he was cleaning something in his house and had his neck in extended position for quite some time but did not have any problems after that. He denies bowel or bladder difficulty. He reports that his strength is improving at this point. He states that his lower extremity strength is normal.  PMH: Past Medical History  Diagnosis Date  . Coronary artery disease   . Hyperlipidemia   . Hypertension     PSH: Past Surgical History  Procedure Laterality Date  . Coronary artery bypass graft  11/24/2008  . Cardiac catheterization  11/20/2008    SH: Social History  Substance Use Topics  . Smoking status: Never Smoker   . Smokeless tobacco: None  . Alcohol Use: No    MEDS: Prior to Admission medications   Medication Sig Start Date End Date Taking? Authorizing Provider  amLODipine (NORVASC) 10 MG tablet TAKE ONE TABLET BY MOUTH ONCE DAILY 01/26/15  Yes Minna Merritts, MD  CALCIUM PO Take 1 tablet by mouth daily.   Yes Historical Provider, MD  MAGNESIUM PO Take 1 tablet by mouth daily.   Yes Historical Provider, MD  Multiple Vitamins-Minerals (EYE VITAMINS PO) Take 1 tablet by mouth daily.   Yes Historical Provider, MD  Multiple Vitamins-Minerals (ZINC PO) Take 1 tablet by mouth daily.   Yes Historical Provider, MD  metoprolol succinate (TOPROL-XL) 25 MG 24 hr tablet TAKE ONE TABLET BY MOUTH ONCE DAILY Patient not taking: Reported on 10/11/2015 01/26/15   Minna Merritts, MD    ALLERGY: No Known Allergies  ROS: ROS  NEUROLOGIC EXAM: Awake, alert, oriented Memory and concentration grossly intact Speech fluent, appropriate CN grossly intact Motor exam: Upper Extremities Deltoid Bicep  Tricep Grip  Right 1/5 4/5 4/5 2/5  Left 1/5 3/5 3/5 3/5   Lower Extremity IP Quad PF DF EHL  Right 5/5 5/5 5/5 5/5 5/5  Left 5/5 5/5 5/5 5/5 5/5   Sensation grossly intact to LT  IMAGING: MRI Brain and Cervical Spine: Brain MR shows two puncate infarcts, one each in the right caudate and parietal cortex.  Cervical spine MR reveals multilevel spondylosis and loss of the normal cervical lordosis. There is some canal stenosis from C3-C7 but there is no spinal cord compression. There is not increased T2 within the spinal cord.  IMPRESSION: - 81 y.o. male with signs and symptoms consistent with a central cord syndrome, but without a mechanism to explain it. He seems to be improving neurologically. The most effective treatment for central cord syndrome is MAP management, with a map goal of greater than 80. Dr. Danise Mina spoke with the patient's son who is radiation oncologist and expressed a desire for him to be on steroids. There is likely no benefit to steroids, but because this patient does not have much in the way of comorbidities, especially diabetes, I think steroids won't hurt. I expect that he will continue to improve as his the expected natural history of this disease. There is no role for surgery at this time.  PLAN: - Maintain MAP > 80 - Dexamethasone 4mg  q6

## 2015-10-11 NOTE — Progress Notes (Signed)
Order for SLE to be done 10/24 received, will conduct as ordered. Luanna Salk, [place] [facility] SLP [phone]

## 2015-10-11 NOTE — ED Notes (Signed)
MRI notified that pt has titanium dental implant and MRI stated that is okay.

## 2015-10-11 NOTE — Progress Notes (Signed)
Stroke Team Progress Note  HISTORY  Jake Garcia is a 81 y.o. male with a history of coronary artery disease, hypertension, hyperlipidemia who presents with bilateral upper extremity weakness. He worked on [place] over the past week much of it done above his head with his neck extended, but finished this on Friday. This evening, he was unable to sleep and was thrashing around in this bed and thinks that he was contorting his neck and hyperextending it in efforts to get comfortable.  He then noticed that he was having trouble using his arm and called to his wife. On arrival, she found him unable to use his arms and he was brought into the emergency room. Since being in the emergency room, he has had some improvement in his right arm but still has severe weakness and is completely plegic in his left arm.  LKW: 8:30 PM tpa given?: no, stroke not suspected, incidental finding, out of window   SUBJECTIVE No family is at bedside. The patient is alert and cooperative. Reports upper extremity weakness upon awakening at 2 AM today, very little pain, no sensory alteration, or motor deficit. Left arm affected more than the right. The patient has had some return of function, not complete. No problems with the legs, no bladder control issues.  OBJECTIVE Most recent Vital Signs: Filed Vitals:   10/11/15 0645 10/11/15 0700 10/11/15 0748 10/11/15 0800  BP: 171/91 170/70  165/75  Pulse: 62 73 77 36  Temp:    98 F (36.7 C)  TempSrc:    Oral  Resp: 18 12 11 15   Height:   5\' 7"  (1.702 m)   Weight:      SpO2: 98% 98% 100% 96%   CBG (last 3)  No results for input(s): GLUCAP in the last 72 hours.  IV Fluid Intake:   . sodium chloride 75 mL/hr at 10/11/15 [id number]    MEDICATIONS  .  stroke: mapping our early stages of recovery book   Does not apply Once  . aspirin  325 mg Oral Daily  . atorvastatin  40 mg Oral q1800  . dexamethasone  4 mg Intravenous 4 times per day  . [START ON 10/12/2015] Influenza  vac split quadrivalent PF  0.5 mL Intramuscular Tomorrow-1000   PRN:  sodium chloride, acetaminophen, ondansetron (ZOFRAN) IV  Diet:  Diet regular Room service appropriate?: Yes; Fluid consistency:: Thin liquids Activity:  Bedrest DVT Prophylaxis:  Lovenox  CLINICALLY SIGNIFICANT STUDIES Basic Metabolic Panel:  Recent Labs Lab 10/11/15 0321 10/11/15 0329  NA 138 144  K 3.6 3.6  CL 108 109  CO2 22  --   GLUCOSE 158* 159*  BUN 25* 28*  CREATININE 2.26* 2.30*  CALCIUM 8.6*  --    Liver Function Tests:  Recent Labs Lab 10/11/15 0321  AST 22  ALT 16*  ALKPHOS 67  BILITOT 0.4  PROT 6.7  ALBUMIN 3.5   CBC:  Recent Labs Lab 10/11/15 0321 10/11/15 0329  WBC 8.8  --   NEUTROABS 5.6  --   HGB 10.9* 12.2*  HCT 33.3* 36.0*  MCV 62.4*  --   PLT 281  --    Coagulation:  Recent Labs Lab 10/11/15 0321  LABPROT 14.2  INR 1.08   Cardiac Enzymes: No results for input(s): CKTOTAL, CKMB, CKMBINDEX, TROPONINI in the last 168 hours. Urinalysis:  Recent Labs Lab 10/11/15 [place] 1.012  PHURINE 7.5  [place] NEGATIVE  BILIRUBINUR NEGATIVE  [place] Stain  NEGATIVE  PROTEINUR 100*  UROBILINOGEN 0.2  NITRITE NEGATIVE  LEUKOCYTESUR NEGATIVE   Lipid Panel    Component Value Date/Time   CHOL 185 11/18/2014 0831   CHOL 157 11/25/2011 0817   TRIG 125 11/18/2014 0831   HDL 33* 11/18/2014 0831   HDL 32* 11/25/2011 0817   CHOLHDL 4.9 11/25/2011 0817   VLDL 21 11/25/2011 0817   LDLCALC 127* 11/18/2014 0831   LDLCALC 104* 11/25/2011 0817   HgbA1C No results found for: HGBA1C  Urine Drug Screen:     Component Value Date/Time   LABOPIA NONE DETECTED 10/11/2015 0515   COCAINSCRNUR NONE DETECTED 10/11/2015 0515   LABBENZ NONE DETECTED 10/11/2015 0515   AMPHETMU NONE DETECTED 10/11/2015 0515   THCU NONE DETECTED 10/11/2015 0515   LABBARB NONE DETECTED 10/11/2015 0515    Alcohol Level: No results for input(s): ETH in the last 168  hours.  Mr Brain Wo Contrast  10/11/2015  CLINICAL DATA:  Initial evaluation to move bilateral upper extremities, acute onset. EXAM: MRI HEAD WITHOUT CONTRAST TECHNIQUE: Multiplanar, multiecho pulse sequences of the brain and surrounding structures were obtained without intravenous contrast. COMPARISON:  Prior study from 06/30/2010. FINDINGS: Punctate 5 mm ischemic infarct within the right caudate head. Additional tiny 8 mm cortical infarct within the right parietal lobe. No associated hemorrhage or mass effect. No other infarct. Normal intravascular flow voids are preserved. Diffuse prominence of the CSF containing spaces is compatible with generalized age-related cerebral atrophy. Patchy T2/FLAIR hyperintensity within the periventricular and deep white matter most consistent with chronic small vessel ischemic disease. Remote lacunar infarcts present within the bilateral thalami. Small remote lacunar infarct within the left caudate head. Multiple remote bilateral cerebellar infarcts present as well. Small remote lacunar infarct within the anterior right corona radiata/centrum semi ovale with associated chronic micro hemorrhage present as well. No mass lesion, midline shift, or mass effect. Ventricular prominence related to global parenchymal volume loss present without hydrocephalus. No extra-axial fluid collection. Craniocervical junction within normal limits. Pituitary gland normal.  No acute abnormality about the orbits. Scattered mucosal thickening with throughout the paranasal sinuses. No mastoid effusion. Inner ear structures grossly normal. Bone marrow signal intensity within normal limits. No scalp soft tissue abnormality. IMPRESSION: 1. Punctate 5 mm acute ischemic nonhemorrhagic infarct within the right caudate head. 2. Small 8 mm cortical ischemic nonhemorrhagic infarct within the right parietal lobe. 3. Atrophy with mild chronic small vessel ischemic disease and multiple remote lacunar infarcts as  above. Electronically Signed   By: Jeannine Boga M.D.   On: 10/11/2015 05:37   Mr Cervical Spine Wo Contrast  10/11/2015  CLINICAL DATA:  Initial evaluation for acute onset inability to move bilateral upper extremities. EXAM: MRI CERVICAL SPINE WITHOUT CONTRAST TECHNIQUE: Multiplanar, multisequence MR imaging of the cervical spine was performed. No intravenous contrast was administered. COMPARISON:  None. FINDINGS: Visualized portions of the brain and posterior fossa demonstrate no acute abnormality. Craniocervical junction widely patent. Straightening with slight reversal of the normal cervical lordosis with apex at C4. There is 3 mm anterolisthesis of C7 on T1. Trace retrolisthesis of C5 on C6. This is likely chronic in nature. No fracture. Signal intensity within the vertebral body bone marrow is normal. No marrow edema. No focal osseous lesion. Signal intensity within the cervical spinal cord is normal. No paraspinous soft tissue abnormality.  No prevertebral edema. C2-C3: Mild bilateral uncovertebral hypertrophy, slightly worse on the right. Superimposed right-sided facet arthrosis. There is resultant moderate right foraminal stenosis. No significant left  foraminal narrowing. Central canal is widely patent. C3-C4: Diffuse degenerative disc osteophyte with bilateral uncovertebral hypertrophy. Left greater than right facet arthrosis. There is resultant moderate to severe left with moderate right foraminal stenosis. Broad-based posterior disc bulge indents and largely effaces the ventral thecal sac and results in moderate canal narrowing. C4-C5: Diffuse degenerative disc osteophyte with intervertebral disc space narrowing and bilateral uncovertebral hypertrophy. Mild bilateral facet arthrosis. There is resultant fairly severe bilateral foraminal stenosis. Broad-based posterior central/left paracentral disc osteophyte flattens and largely effaces the ventral thecal sac and results in moderate canal  stenosis. Minimal flattening of the cervical spinal cord without cord signal changes. C5-C6: There is a predominantly central/ right paracentral disc osteophyte complex that indents the right ventral thecal sac and flattens the right aspect of the cervical spinal cord. No cord signal changes. Superimposed bilateral uncovertebral hypertrophy and facet arthrosis. There is resultant severe right with moderate left foraminal narrowing. Moderate canal stenosis. C6-C7: Broad-based degenerative disc osteophyte with mild uncovertebral hypertrophy and prominent intervertebral disc space narrowing. There is resultant moderate bilateral foraminal stenosis, slightly worse on the left. Broad-based posterior disc osteophyte mildly effaces the ventral thecal sac and results in mild to moderate canal stenosis. C7-T1: 3 mm anterolisthesis of C7 on T1. Secondary mild diffuse disc bulge without focal disc herniation. Ligamentum flavum hypertrophy and bilateral facet arthrosis. There is resultant mild left foraminal stenosis. No significant right foraminal canal stenosis. Visualized upper thoracic spine demonstrates no acute abnormality. IMPRESSION: 1. No acute abnormality within the cervical spine. 2. 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 There is resultant moderate canal stenosis at C3-4 through C6-7. Please see above report for a full description of these findings. Electronically Signed   By: Jeannine Boga M.D.   On: 10/11/2015 06:07    MRI cervical  IMPRESSION: 1. No acute abnormality within the cervical spine. 2. 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 There is resultant moderate canal stenosis at C3-4 through C6-7. Please  see above report for a full description of these findings.   MRI of the brain   IMPRESSION: 1. Punctate 5 mm acute ischemic nonhemorrhagic infarct within the right caudate head. 2. Small 8 mm cortical ischemic nonhemorrhagic infarct within the right parietal lobe. 3. Atrophy with mild chronic small vessel ischemic disease and multiple remote lacunar infarcts as above. MRA of the brain pending   2D Echocardiogram  pending  Carotid Doppler  pending  CXR    EKG  normal sinus rhythm, frequent PVC  Therapy Recommendations pending  Physical Exam  General: The patient is alert and cooperative at the time of the examination.  Respiratory: Lung fields are clear  Cardiovascular: Occasionally irregular heart rhythm, no murmurs  Skin: No significant peripheral edema is noted.   Neurologic Exam  Mental status: The patient is alert and oriented x 3 at the time of the examination. The patient has apparent normal recent and remote memory, with an apparently normal attention span and concentration ability.   Cranial nerves: Facial symmetry is present. Speech is normal, no aphasia or dysarthria is noted. Extraocular movements are full. Visual fields are full.  Motor: The patient has good strength in the lower extremities. With the upper extremity is, there is 3/5 grip strength bilaterally, 4 minus/5 strength with elbow  flexion and extension, deltoid muscles. Left arm is slightly weaker than right.  Sensory examination: Soft touch sensation is symmetric on the face, arms, and legs.  Coordination: The patient has good  heel-to-shin bilaterally. The patient has difficulty performing finger-nose-finger bilaterally.  Gait and station: The gait was not tested.  Reflexes: Deep tendon reflexes are symmetric.    ASSESSMENT Mr. ZYONN BOOK is a 81 y.o. male presenting with punctate infarcts in the right parietal area and right caudate head. These small strokes likely are not associated  with his current symptoms. The patient has a clinical syndrome approximating a central cord syndrome. The patient has painless onset of her motor weakness of the upper extremities, lower extremity is are spared. In the presence of small brain infarcts that could be embolic, need to suspect an ischemic infarct to the upper spinal cord. Stroke evaluation is underway. The patient was not on aspirin prior to admission. He does not smoke cigarettes or drink alcohol.   Central cord syndrome  Right brain punctate strokes, possibly embolic  Coronary artery disease  Dyslipidemia  Hypertension  Hospital day # 0  TREATMENT/PLAN  Aspirin has been added  The patient has been placed on steroid therapy, not completely sure this will be of any benefit. MRI cervical spine does not show significant spinal cord impingement, suspect ischemic deficits  Carotid Doppler study  2-D echocardiogram  Physical and occupational therapy evaluation  Hemoglobin A1c pending  Lipid panel pending   10/11/2015 9:24 AM   Lenor Coffin Phone [phone]

## 2015-10-11 NOTE — Progress Notes (Signed)
I was called to talk with the patient. He has expressed desires to leave the hospital against medical device. I had a long discussion with him, tried to convince him to stay in the hospital. It is not clear this point whether he will actually stay, he is trying to reach his wife to come pick him up.

## 2015-10-11 NOTE — ED Notes (Signed)
Pt states that he woke up 1 hour ago and was unable to move both arms. Pt states that he felt completely normal at 2100 when he went to bed. Pt able to move both legs and has feelings in both arms but has no effort against gravity. Pt alert and oriented x 4. GCS 15

## 2015-10-12 ENCOUNTER — Inpatient Hospital Stay (HOSPITAL_COMMUNITY): Payer: Medicare Other

## 2015-10-12 DIAGNOSIS — S14129D Central cord syndrome at unspecified level of cervical spinal cord, subsequent encounter: Secondary | ICD-10-CM

## 2015-10-12 DIAGNOSIS — I639 Cerebral infarction, unspecified: Secondary | ICD-10-CM

## 2015-10-12 DIAGNOSIS — I6330 Cerebral infarction due to thrombosis of unspecified cerebral artery: Secondary | ICD-10-CM | POA: Insufficient documentation

## 2015-10-12 LAB — BASIC METABOLIC PANEL
ANION GAP: 8 (ref 5–15)
BUN: 32 mg/dL — ABNORMAL HIGH (ref 6–20)
CALCIUM: 9 mg/dL (ref 8.9–10.3)
CHLORIDE: 113 mmol/L — AB (ref 101–111)
CO2: 20 mmol/L — AB (ref 22–32)
CREATININE: 1.85 mg/dL — AB (ref 0.61–1.24)
GFR, EST AFRICAN AMERICAN: 37 mL/min — AB (ref >60)
GFR, EST NON AFRICAN AMERICAN: 32 mL/min — AB (ref >60)
GLUCOSE: 176 mg/dL — AB (ref 65–99)
POTASSIUM: 4.1 mmol/L (ref 3.5–5.1)
Sodium: 141 mmol/L (ref 135–145)

## 2015-10-12 LAB — CBC
HEMATOCRIT: 31.8 % — AB (ref 39.0–52.0)
HEMOGLOBIN: 10.2 g/dL — AB (ref 13.0–17.0)
MCH: 19.9 pg — ABNORMAL LOW (ref 26.0–34.0)
MCHC: 32.1 g/dL (ref 30.0–36.0)
MCV: 62 fL — AB (ref 78.0–100.0)
Platelets: 269 10*3/uL (ref 150–400)
RBC: 5.13 MIL/uL (ref 4.22–5.81)
RDW: 16.3 % — AB (ref 11.5–15.5)
WBC: 7.4 10*3/uL (ref 4.0–10.5)

## 2015-10-12 LAB — PHOSPHORUS: PHOSPHORUS: 2.8 mg/dL (ref 2.5–4.6)

## 2015-10-12 LAB — MAGNESIUM: MAGNESIUM: 2.1 mg/dL (ref 1.7–2.4)

## 2015-10-12 LAB — HEMOGLOBIN A1C
Hgb A1c MFr Bld: 5.8 % — ABNORMAL HIGH (ref 4.8–5.6)
Mean Plasma Glucose: 120 mg/dL

## 2015-10-12 MED ORDER — APIXABAN 5 MG PO TABS: 2.5000 mg | ORAL_TABLET | Freq: Two times a day (BID) | ORAL | Status: DC

## 2015-10-12 MED ORDER — ENOXAPARIN SODIUM 30 MG/0.3ML ~~LOC~~ SOLN
30.0000 mg | SUBCUTANEOUS | Status: DC
  Administered 2015-10-13: 30 mg via SUBCUTANEOUS
  Filled 2015-10-12: qty 0.3

## 2015-10-12 NOTE — Progress Notes (Signed)
VASCULAR LAB PRELIMINARY  PRELIMINARY  PRELIMINARY  PRELIMINARY  Carotid duplex  completed.    Preliminary report:  Bilateral:  1-39% ICA stenosis.  Right:  Vertebral artery is bi directional..  Left: Vertebral artery flow is antegrade.    Avannah Decker, RVT 10/12/2015, 1:55 PM

## 2015-10-12 NOTE — Progress Notes (Signed)
Utilization Review Completed.  

## 2015-10-12 NOTE — Evaluation (Signed)
Speech Language Pathology Evaluation Patient Details Name: Jake Garcia MRN: [id number] DOB: 11-Mar-1929 Today's Date: 10/12/2015 Time: [id number]     Problem List:  Patient Active Problem List   Diagnosis Date Noted  . Central cord syndrome ([place]) 10/11/2015  . Carotid stenosis 06/25/2013  . Preop cardiovascular exam 06/25/2013  . CAD (coronary artery disease) 08/05/2011  . Hyperlipidemia 08/05/2011  . HTN (hypertension) 08/05/2011  . Chest discomfort 08/05/2011   Past Medical History:  Past Medical History  Diagnosis Date  . Coronary artery disease   . Hyperlipidemia   . Hypertension    Past Surgical History:  Past Surgical History  Procedure Laterality Date  . Coronary artery bypass graft  11/24/2008  . Cardiac catheterization  11/20/2008   HPI:  81yo caucasian male with PMHx of CAD, HTN, HLD who presented to the ED with sudden onset of bilateral upper extremity weakness. MRI shows two puncate infarcts, one each in the right caudate and parietal cortex, likely incidental and unrelated to acute symptoms per neurologist.Per neurosurgery, symptoms most consistent with central cord syndrome, wtihout mechanism to explain it.    Assessment / Plan / Recommendation Clinical Impression  Cognitive-linguistic evaluation complete. Patient presents with functional cognitive-linguistic skills with strong long term memory skills and problem solving abilities, intact language, and intelligible speech. Patient with decreased sustained attention abilities impacting storage of new information, although highly suspect impacted by internal distractions/desire to return home with decreased awareness of need for further intervention prior to d/c. Doubtful that these are new impairement however. No acute SLP needs indicated as patient highly functional for complex ADLs. Please re-consult as needed.     SLP Assessment  Patient does not need any further Speech Lanaguage Pathology Services           Pertinent Vitals/Pain Pain Assessment: No/denies pain    SLP Evaluation Prior Functioning  Cognitive/Linguistic Baseline: Information not available  Lives With: Spouse   Cognition  Overall Cognitive Status:  (suspect at baseline) Orientation Level: Oriented X4 Attention: Sustained Sustained Attention: Impaired Sustained Attention Impairment: Verbal complex (intact for functional complex) Memory: Impaired Memory Impairment: Storage deficit (long term memory intact) Awareness: Impaired Awareness Impairment: Intellectual impairment (anticipatory awareness appears intact) Problem Solving: Appears intact Behaviors: Impulsive (verbally) Safety/Judgment: Impaired (desire to return home AMA,suspect baseline personality)    Comprehension  Auditory Comprehension Overall Auditory Comprehension: Appears within functional limits for tasks assessed Visual Recognition/Discrimination Discrimination: Within Function Limits Reading Comprehension Reading Status: Within funtional limits    Expression Expression Primary Mode of Expression: Verbal Verbal Expression Overall Verbal Expression: Appears within functional limits for tasks assessed Written Expression Written Expression: Not tested   Oral / Motor Oral Motor/Sensory Function Overall Oral Motor/Sensory Function: Appears within functional limits for tasks assessed Motor Speech Overall Motor Speech: Appears within functional limits for tasks assessed   GO   Gabriel Rainwater MA, CCC-SLP [phone]   [name] [name] 10/12/2015, 9:12 AM

## 2015-10-12 NOTE — Progress Notes (Signed)
Occupational Therapy Evaluation Patient Details Name: Jake Garcia MRN: [id number] DOB: [birth year] Today's Date: 10/12/2015    History of Present Illness pt presents with UE weakness presenting like Central Cord Syndrome.  pt found to have C3-7 Stenosis, R Caudate, and Parietal Cortex infarcts.  pt with hx of CABG and HTN.     Clinical Impression   PTA, pt independent with mobility and ADL. Pt presents with significant BUE weakness (stonger distally L hand; stronger proximally R shoulder) severely limiting his ability to complete ADL tasks. Pt frustrated at inability to use his arms. Began education in HEP. Recommend pt follow up with outpt OT. Will return this am to establish a HEP and begin training with ADL.     Follow Up Recommendations  Outpatient OT;Supervision - Intermittent    Equipment Recommendations  None recommended by OT    Recommendations for Other Services       Precautions / Restrictions Precautions Precautions: None Restrictions Weight Bearing Restrictions: No      Mobility Bed Mobility Overal bed mobility: Needs Assistance Bed Mobility: Supine to Sit     Supine to sit: Supervision;HOB elevated     General bed mobility comments: pt needs increased time and difficulty utilizing UEs to A with coming to sitting.    Transfers Overall transfer level: Modified independent Equipment used: None Transfers: Sit to/from Stand Sit to Stand: Modified independent (Device/Increase time)         General transfer comment: No physical A needed.  pt somewhat impulsive.      Balance Overall balance assessment: No apparent balance deficits (not formally assessed)                               Standardized Balance Assessment Standardized Balance Assessment : Dynamic Gait Index   Dynamic Gait Index Level Surface: Normal Change in Gait Speed: Mild Impairment Gait with Horizontal Head Turns: Mild Impairment Gait with Vertical Head Turns:  Normal Gait and Pivot Turn: Normal Step Over Obstacle: Mild Impairment Step Around Obstacles: Normal      ADL Overall ADL's : Needs assistance/impaired Eating/Feeding: Minimal assistance Eating/Feeding Details (indicate cue type and reason): difficulty with maintaining grasp on utensils and opening containers Grooming: Moderate assistance;Sitting   Upper Body Bathing: Moderate assistance;Sitting   Lower Body Bathing: Moderate assistance;Sit to/from stand   Upper Body Dressing : Maximal assistance;Sitting   Lower Body Dressing: Moderate assistance;Sit to/from stand   Toilet Transfer: Supervision/safety   Toileting- Water quality scientist and Hygiene: Moderate assistance;Sit to/from stand       Functional mobility during ADLs: Min guard       Vision     Perception     Praxis      Pertinent Vitals/Pain Pain Assessment: No/denies pain     Hand Dominance Right   Extremity/Trunk Assessment Upper Extremity Assessment Upper Extremity Assessment: Defer to OT evaluation RUE Deficits / Details: greater strength distally; grip strength @ 3/5. poor in hand manipulation skills. Able to  oppose all digits with the exception little finger; wrist flex/ext 3+/5; elbow flex/ext 3/5 shoulder flexabd 2/5; ER 2-/5; shoulder elevation/depression 3+/5 RUE Coordination: decreased fine motor;decreased gross motor LUE Deficits / Details: improved strength distally, but weaker promimally than RUE; @ 3=/5 grip; able to oppose thumb to all digits; elbow flex 2+/5; exlbo ext 3/5; shoulder FF;Abd;ER 2/5 LUE Coordination: decreased fine motor;decreased gross motor   Lower Extremity Assessment Lower Extremity Assessment: Overall WFL for tasks assessed  Cervical / Trunk Assessment Cervical / Trunk Assessment: Normal   Communication Communication Communication: HOH   Cognition Arousal/Alertness: Awake/alert Behavior During Therapy: Restless Overall Cognitive Status: No family/caregiver  present to determine baseline cognitive functioning Area of Impairment:  (most likely at baseline)               General Comments: Unclear if this is pt's baseline.  pt confused and thought he has been here 3 days when he had actually been admitted yesterday.     General Comments       Exercises Exercises: Other exercises Other Exercises Other Exercises: towel slide ex on table top for BUE shoulder AAROM in FF; Abd and elbow flex/ext   Shoulder Instructions      Home Living Family/patient expects to be discharged to:: Private residence Living Arrangements: Spouse/significant other Available Help at Discharge: Family;Available 24 hours/day Type of Home: House Home Access: Stairs to enter [employer] of Steps: 2 Entrance Stairs-Rails: None Home Layout: Multi-level;Bed/bath upstairs Alternate Level Stairs-Number of Steps: flight Alternate Level Stairs-Rails: Left Bathroom Shower/Tub: Occupational psychologist: Standard     Home Equipment: None   Additional Comments: pt indicates his wife just finished Cancer treatment and unclear if she is able to A pt or if she has been needing A.    Lives With: Spouse    Prior Functioning/Environment Level of Independence: Independent             OT Diagnosis: Generalized weakness   OT Problem List: Decreased strength;Decreased range of motion;Decreased coordination;Impaired UE functional use   OT Treatment/Interventions: Self-care/ADL training;Therapeutic exercise;Neuromuscular education;DME and/or AE instruction;Therapeutic activities;Patient/family education    OT Goals(Current goals can be found in the care plan section) Acute Rehab OT Goals Patient Stated Goal: to be able to use my arms OT Goal Formulation: With patient Time For Goal Achievement: 10/19/15 Potential to Achieve Goals: Good  OT Frequency: Min 3X/week   Barriers to D/C:            Co-evaluation PT/OT/SLP Co-Evaluation/Treatment:  Yes Reason for Co-Treatment: Necessary to address cognition/behavior during functional activity PT goals addressed during session: Mobility/safety with mobility;Balance OT goals addressed during session: ADL's and self-care;Strengthening/ROM      End of Session Equipment Utilized During Treatment: Gait belt Nurse Communication: Mobility status  Activity Tolerance: Patient tolerated treatment well Patient left: in chair;with call bell/phone within reach;with chair alarm set   Time: [id number] OT Time Calculation (min): 27 min Charges:  OT General Charges $OT Visit: 1 Procedure OT Evaluation $Initial OT Evaluation Tier I: 1 Procedure G-Codes:    Miarose Lippert,HILLARY 11/04/2015, 1:46 PM   [facility], OTR/L  [phone] 2015-11-04

## 2015-10-12 NOTE — Progress Notes (Signed)
EKG to r/o A-fib done and reviewed by Burnetta Sabin, NP & Dr. Lamonte Sakai. No A-fib per MD and no Elloquis to be given. Orders adjusted. Pharmacy updated.   Jake Garcia

## 2015-10-12 NOTE — Progress Notes (Signed)
STROKE TEAM PROGRESS NOTE   HISTORY Jake Garcia is a 81 y.o. male with a history of coronary artery disease, hypertension, hyperlipidemia who presents with bilateral upper extremity weakness. He worked on painting over the past week much of it done above his head with his neck extended, but finished this on Friday. This evening, he was unable to sleep and was thrashing around in this bed and thinks that he was contorting his neck and hyperextending it in efforts to get comfortable.  He then noticed that he was having trouble using his arm and called to his wife. On arrival, she found him unable to use his arms and he was brought into the emergency room. Since being in the emergency room, he has had some improvement in his right arm but still has severe weakness and is completely plegic in his left arm.  He was LKW 8:30 PM. Patient was not administered TPA secondary to stroke not suspected, incidental finding, out of window. He was admitted for further evaluation and treatment.   SUBJECTIVE (INTERVAL HISTORY) No family is at the bedside.  Overall he feels his condition is rapidly improving - he reports his arms were flaccid yesterday, but he reports he can move them without problems today. He was told yesterday he would be going home and wants to know when that is going to happen. Patient's demands to go home and has belligerent behavior which may be side effect of high-dose steroids   OBJECTIVE Temp:  [97.3 F (36.3 C)-98.1 F (36.7 C)] 97.7 F (36.5 C) (10/24 0736) Pulse Rate:  [36-90] 76 (10/24 0500) Cardiac Rhythm:  [-] Normal sinus rhythm (10/23 2000) Resp:  [12-23] 19 (10/24 0500) BP: (142-175)/(54-85) 161/73 mmHg (10/24 0500) SpO2:  [92 %-100 %] 95 % (10/24 0500) Weight:  [71.2 kg (156 lb 15.5 oz)] 71.2 kg (156 lb 15.5 oz) (10/24 0400)  CBC:  Recent Labs Lab 10/11/15 0321 10/11/15 0329  WBC 8.8  --   NEUTROABS 5.6  --   HGB 10.9* 12.2*  HCT 33.3* 36.0*  MCV 62.4*  --   PLT  281  --     Basic Metabolic Panel:  Recent Labs Lab 10/11/15 0321 10/11/15 0329  NA 138 144  K 3.6 3.6  CL 108 109  CO2 22  --   GLUCOSE 158* 159*  BUN 25* 28*  CREATININE 2.26* 2.30*  CALCIUM 8.6*  --     Lipid Panel:    Component Value Date/Time   CHOL 163 10/11/2015 0935   CHOL 185 11/18/2014 0831   TRIG 59 10/11/2015 0935   HDL 33* 10/11/2015 0935   HDL 33* 11/18/2014 0831   CHOLHDL 4.9 10/11/2015 0935   VLDL 12 10/11/2015 0935   LDLCALC 118* 10/11/2015 0935   LDLCALC 127* 11/18/2014 0831   HgbA1c: No results found for: HGBA1C Urine Drug Screen:    Component Value Date/Time   LABOPIA NONE DETECTED 10/11/2015 0515   COCAINSCRNUR NONE DETECTED 10/11/2015 0515   LABBENZ NONE DETECTED 10/11/2015 0515   AMPHETMU NONE DETECTED 10/11/2015 0515   THCU NONE DETECTED 10/11/2015 0515   LABBARB NONE DETECTED 10/11/2015 0515      IMAGING  Mr Mra Head Wo Contrast 10/11/2015   1. Focal signal loss in the proximal left posterior cerebral artery is likely artifactual given the absence of significant ischemic change in this territory. 2. No other significant proximal stenosis, aneurysm, or branch vessel occlusion to account for the acute infarcts.   Mr Brain Lottie Dawson  Contrast 10/11/2015  1. Punctate 5 mm acute ischemic nonhemorrhagic infarct within the right caudate head. 2. Small 8 mm cortical ischemic nonhemorrhagic infarct within the right parietal lobe. 3. Atrophy with mild chronic small vessel ischemic disease and multiple remote lacunar infarcts   Mr Cervical Spine Wo Contrast 10/11/2015  1. No acute abnormality within the cervical spine. 2. 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 There is resultant moderate canal stenosis at C3-4 through C6-7. Please see above report for a full description of these findings.   2D Echocardiogram   -  Left ventricle: The cavity size was normal. There was moderateconcentric hypertrophy. Systolic function was vigorous. Theestimated ejection fraction was in the range of 65% to 70%. Wallmotion was normal; there were no regional wall motionabnormalities. Doppler parameters are consistent with abnormalleft ventricular relaxation (grade 1 diastolic dysfunction). - Mitral valve: Calcified annulus. Valve area by continuity equation (using LVOT flow): 2.09 cm^2. - Left atrium: The atrium was moderately dilated. - Pulmonary arteries: Systolic pressure was mildly increased. PApeak pressure: 37 mm Hg (S).   PHYSICAL EXAM Pleasant elderly caucasian male not in distress. . Afebrile. Head is nontraumatic. Neck is supple without bruit.    Cardiac exam no murmur or gallop. Lungs are clear to auscultation. Distal pulses are well felt. Neurological Exam :  Awake alert oriented 3 with normal speech and language function. No dysarthria or aphasia. Attention, registration and memory are intact. Extraocular moments are full range without nystagmus. Fundi were not visualized. Vision acuity seems adequate. Face is symmetric without weakness. Tongue is midline. Good cough and gag. Motor system exam revealed bilateral proximal lower extremity weakness with great 2/5 shoulder strength and 4/5 biceps, triceps with mild weakness of grip and right greater than left. Mild weakness of intrinsic hand muscles. Lower extremity strength is symmetric without focal weakness. Deep tendon reflexes are brisk but no clonus. Plantars are both equivocal. Touch pinprick sensation are preserved bilaterally. ASSESSMENT/PLAN [name] is a 81 y.o. male with history of coronary artery disease, hypertension, hyperlipidemia presenting with upper extremity weakness. He did not receive IV t-PA due to stroke not suspected, incidental finding.   Cord Compression with cervical myelopathy  Etiology of presenting arm weakness - improving  on steroids  Cervical Spine MRA severe cervical disease C4-5 thru C6-7   Seen by Dr. Cyndy Freeze. Dx with central cord syndrome  Plan for MAP management > 80 and steroids  Felt there is no role for surgery at this time.  Stroke:  Incidental small right brain infarcts, Non-dominant  secondary to new onset atrial fibrillation source  Resultant  No associated neuro deficits  MRI  Small R caudate head, small R parietal infarcts. Atrophy. small vessel disease. Old lacunar infarcts.  MRA  No correctable large vessel stenosis   Carotid Doppler  pending   2D Echo  No source of embolus   LDL 118  HgbA1c pending  SCDsfor VTE prophylaxis  Diet regular Room service appropriate?: Yes; Fluid consistency:: Thin  No antithrombotic prior to admission, now on aspirin 325 mg daily. Given new atrial fibrillation, will ask pharmacy to dose eliquis. Dr. Leonie Man discussed with Dr. Cyndy Freeze  Patient counseled to be compliant with his antithrombotic medications  Ongoing aggressive stroke risk factor management  Therapy recommendations:  pending   Disposition:  pending   Atrial Fibrillation with ? RVR  newly identified during rounds, HR  up to 130s when walking in the hall  EKG to confirm  Home anticoagulation:  None  CHA2DS2-VASc Score = 6, ?2 oral anticoagulation recommended  Age in Years:  ?69   +2    Sex:  Male   0    Hypertension History:  yes   +1     Diabetes Mellitus:  0   Congestive Heart Failure History:  0  Vascular Disease History:  yes   +1     Stroke/TIA/Thromboembolism History:  yes   +2  Dr. Leonie Man spoke with Dr. Cyndy Freeze. Will start Eliquis for secondary stroke prevention  Suggest cardiology consult  Hypertension  Stable Permissive hypertension (OK if < 220/120) but gradually normalize in 5-7 days  Hyperlipidemia  Home meds:  No statin  LDL 118, goal < 70  Now on lipitor 40  Continue statin at discharge  Other Stroke Risk Factors  Advanced age  Coronary artery  disease  Other Active Problems  CKD  Normocytic anemia  Hospital day # 1  BIBY,SHARON  Standard for Pager information 10/12/2015 10:29 AM  I have personally examined this patient, reviewed notes, independently viewed imaging studies, participated in medical decision making and plan of care. I have made any additions or clarifications directly to the above note. Agree with note above.  He presented with bilateral upper extremity weakness likely due to cervical myelopathy. MRI scan shows tiny punctate white matter infarcts which are likely incidental and cannot explain his bilateral weakness. He however remains at risk for recurrent stroke, TIA, neurological worsening and needs ongoing stroke evaluation. Continue aspirin for stroke prevention. If atrial fibrillation is confirmed may need to change to eliquis. Discussed with Dr. Cyndy Freeze neurosurgeon and Dr. Lamonte Sakai  pulmonary critical care medicine.  Antony Contras, MD Medical Director [facility] Pager: [phone] 10/12/2015 3:24 PM    To contact Stroke Continuity provider, please refer to [email or web address] After hours, contact General Neurology

## 2015-10-12 NOTE — Progress Notes (Signed)
PULMONARY / CRITICAL CARE MEDICINE   Name: GRANTLEE SLANKARD MRN: [id number] DOB: [birth year]    ADMISSION DATE:  10/11/2015 CONSULTATION DATE:  10/11/15  REFERRING MD :  Claudine Mouton  CHIEF COMPLAINT:  Inability to move arms  INITIAL PRESENTATION: ED  HISTORY OF PRESENT ILLNESS:  81yo caucasian male with PMHx of CAD, HTN, HLD who presented to the ED with sudden onset of bilateral upper extremity weakness. Per patient, this is of sudden onset where he went to sleep last night at 8pm and awoke with the inability to move his arms. Per report, he had been painting over his head. His sensation is intact but has motor dysfunction with a slight improvement in his right upper extremity.   SUBJECTIVE:  Slight improvement in his UE weakness but clearly not at baseline.   VITAL SIGNS: Temp:  [97.3 F (36.3 C)-98.1 F (36.7 C)] 97.7 F (36.5 C) (10/24 0736) Pulse Rate:  [36-90] 76 (10/24 0500) Resp:  [12-23] 19 (10/24 0500) BP: (142-175)/(54-85) 161/73 mmHg (10/24 0500) SpO2:  [92 %-100 %] 95 % (10/24 0500) Weight:  [71.2 kg (156 lb 15.5 oz)] 71.2 kg (156 lb 15.5 oz) (10/24 0400) HEMODYNAMICS:   VENTILATOR SETTINGS:   INTAKE / OUTPUT:  Intake/Output Summary (Last 24 hours) at 10/12/15 0845 Last data filed at 10/12/15 0800  Gross per 24 hour  Intake   1725 ml  Output    910 ml  Net    815 ml    PHYSICAL EXAMINATION: General:  81yo caucasian male who appears his stated age.  is alert, awake, well nourished and well developed. Agitated and upset regarding current situation Neuro: AAOx4 Cranial nerves II-XII grossly intact. PERRL. EOMI. Sensation intact in all extremities. Left upper extremities is plegic. Right upper extremity with 2/5 in the shoulder, 2/5 in elbow, 1/5 in wrist. Patient unable to squeeze  HEENT: Head, normocephalic, atraumatic. Ears symmetric, permeable. Eyes: no conjunctival icterus, no erythema. Nose: permeable, midline septum Clear oropharynx fair dentition. Cardiovascular:  S1S2 RRR no murmurs, rubs, or gallops auscultated. No thrills palpated.  Lungs:  Chest symmetrical with respirations, clear to auscultation bilaterally with no wheezing, crackles, equal expansion.  Musculoskeletal:  No muscle atrophy noted + weakness in bilateral upper extremities. No swelling nor tenderness of the joints.  Skin:  No notable scars, rashes, cruises. No bed sores noted.  Lymphatics: no palpable lymphadenopathy of cervical, supraclvicular nor inguinal areas.    LABS:  CBC  Recent Labs Lab 10/11/15 0321 10/11/15 0329  WBC 8.8  --   HGB 10.9* 12.2*  HCT 33.3* 36.0*  PLT 281  --    Coag's  Recent Labs Lab 10/11/15 0321  APTT 29  INR 1.08   BMET  Recent Labs Lab 10/11/15 0321 10/11/15 0329  NA 138 144  K 3.6 3.6  CL 108 109  CO2 22  --   BUN 25* 28*  CREATININE 2.26* 2.30*  GLUCOSE 158* 159*   Electrolytes  Recent Labs Lab 10/11/15 0321  CALCIUM 8.6*   Sepsis Markers No results for input(s): LATICACIDVEN, PROCALCITON, O2SATVEN in the last 168 hours. ABG No results for input(s): PHART, PCO2ART, PO2ART in the last 168 hours. Liver Enzymes  Recent Labs Lab 10/11/15 0321  AST 22  ALT 16*  ALKPHOS 67  BILITOT 0.4  ALBUMIN 3.5   Cardiac Enzymes No results for input(s): TROPONINI, PROBNP in the last 168 hours. Glucose No results for input(s): GLUCAP in the last 168 hours.  Imaging Mr [facility] Contrast  10/11/2015  CLINICAL DATA:  Bilateral upper extremity weakness and numbness. Two punctate acute nonhemorrhagic infarcts within the right MCA distribution. EXAM: MRA HEAD WITHOUT CONTRAST TECHNIQUE: Angiographic images of the Circle of Willis were obtained using MRA technique without intravenous contrast. COMPARISON:  MRI brain from the same day. FINDINGS: The internal carotid arteries are within normal limits from the high cervical segments through the ICA termini. The A1 and M1 segments are normal. The anterior communicating artery is  patent. The MCA bifurcations are intact. The proximal MCA branch vessels are within normal limits bilaterally. The left vertebral artery is dominant. The right PICA origin is just below the field of view. The left PICA is not visualized. Bilateral AICA vessels are present. The basilar artery is within normal limits. The left posterior cerebral artery originates from the basilar tip. There is focal signal loss in the proximal left posterior cerebral artery. The right posterior cerebral artery is of fetal type. PCA branch vessels are intact bilaterally. IMPRESSION: 1. Focal signal loss in the proximal left posterior cerebral artery is likely artifactual given the absence of significant ischemic change in this territory. 2. No other significant proximal stenosis, aneurysm, or branch vessel occlusion to account for the acute infarcts. Electronically Signed   By: San Morelle M.D.   On: 10/11/2015 13:55     ASSESSMENT / PLAN:  PULMONARY A:No active issues P:    CARDIOVASCULAR CVL A: Hypertension Coronary Artery Disease P: goal is to keep MAP >80  RENAL A:  Chronic Kidney Disease, unknown stage. Creat 1.8 12/2011 P:  IVF to [place] A:  No active issues P:   Swallowing eval and then start diet  HEMATOLOGIC A:  Normocytic Anemia P:   INFECTIOUS A:  No active issues P:    ENDOCRINE A:  No active issues   P:  Monitor blood glucose levels for steroid induced hyperglycemia  NEUROLOGIC A:  Possible central cord syndrome Acute CVA, punctate in the right caudate head, 2mm cortical ischemic infarct in the right parietal lobe. P:  F/u neurosurgery and neurology recommendations.  Bp control w MAP > 80 Empiric steroids ordered, although note NSGY recs indicate no clear benefit  DISPO:  BP has been at goal. Will discuss with Neurology regarding transferring p[t to either the Neuro or NSGY service for further management.   FAMILY  - Updates: care discussed with patient    - Inter-disciplinary family meet or Palliative Care meeting due by:  11/18/15     Baltazar Apo, MD, PhD 10/12/2015, 8:48 AM [facility] [phone] or if no answer [phone]

## 2015-10-12 NOTE — Progress Notes (Deleted)
ANTICOAGULATION CONSULT NOTE - Initial Consult  Pharmacy Consult for apixaban Indication: atrial fibrillation  No Known Allergies  Patient Measurements: Height: 5\' 7"  (170.2 cm) Weight: 156 lb 15.5 oz (71.2 kg) IBW/kg (Calculated) : 66.1  Vital Signs: Temp: 97.7 F (36.5 C) (10/24 0736) Temp Source: Oral (10/24 0736) BP: 161/79 mmHg (10/24 0900) Pulse Rate: 91 (10/24 0900)  Labs:  Recent Labs  10/11/15 0321 10/11/15 0329 10/12/15 0244  HGB 10.9* 12.2* 10.2*  HCT 33.3* 36.0* 31.8*  PLT 281  --  269  APTT 29  --   --   LABPROT 14.2  --   --   INR 1.08  --   --   CREATININE 2.26* 2.30* 1.85*    Estimated Creatinine Clearance: 27.8 mL/min (by C-G formula based on Cr of 1.85).   Medical History: Past Medical History  Diagnosis Date  . Coronary artery disease   . Hyperlipidemia   . Hypertension     Medications:  Prescriptions prior to admission  Medication Sig Dispense Refill Last Dose  . amLODipine (NORVASC) 10 MG tablet TAKE ONE TABLET BY MOUTH ONCE DAILY 90 tablet 3 10/10/2015 at Unknown time  . CALCIUM PO Take 1 tablet by mouth daily.   10/10/2015 at Unknown time  . MAGNESIUM PO Take 1 tablet by mouth daily.   10/10/2015 at Unknown time  . Multiple Vitamins-Minerals (EYE VITAMINS PO) Take 1 tablet by mouth daily.   10/10/2015 at Unknown time  . Multiple Vitamins-Minerals (ZINC PO) Take 1 tablet by mouth daily.   10/10/2015 at Unknown time  . metoprolol succinate (TOPROL-XL) 25 MG 24 hr tablet TAKE ONE TABLET BY MOUTH ONCE DAILY (Patient not taking: Reported on 10/11/2015) 90 tablet 3 Not Taking at 0800    Assessment: 87 yom presented with bilateral weakness and numbness. To start apixaban for new onset afib. He was not on any anticoagulation PTA. He did receive a dose of lovenox prophylaxis this AM. H/H slightly low and platelets are WNL. Scr is elevated at 1.85 (pt has a known history of CKD). Age is >80 and weight is 71kg.   Goal of Therapy:  Stroke  prevention   Plan:  - DC lovenox - Apixaban 2.5mg  PO BID - will start tonight since pt had a dose of lovenox this AM - Monitor for S&S of bleeding and renal function  Raynell Upton, Rande Lawman 10/12/2015,10:20 AM

## 2015-10-12 NOTE — Progress Notes (Signed)
No acute events.  Nursing staff reports patient has been somewhat belligerent overnight.  He states that we aren't doing anything for him and he would like to go home. AVSS Awake and alert Deltoid 1-2/5 bilaterally Biceps, triceps, grip 4/5 bilaterally Lower extremities 5/5 bilaterally Neurologically improving No acute surgical intervention I suspect that his belligerent behavior is a side effect of steroids, I would suggest stopping them now. As long as he is maintaining his MAP >80 on his own he can transfer out of the ICU PT/OT to eval

## 2015-10-12 NOTE — Progress Notes (Signed)
Occupational Therapy Treatment Patient Details Name: Jake Garcia MRN: [id number] DOB: 08/02/1929 Today's Date: 10/12/2015    History of present illness pt presents with UE weakness presenting like Central Cord Syndrome.  pt found to have C3-7 Stenosis, R Caudate, and Parietal Cortex infarcts.  pt with hx of CABG and HTN.     OT comments  Given theratubing to increase independence with self feeding. Initiated HEP. Written information given. Pt very appreciative of help. Will continue to follow acutely to address established goals.   Follow Up Recommendations  Outpatient OT;Supervision - Intermittent    Equipment Recommendations  None recommended by OT    Recommendations for Other Services      Precautions / Restrictions Precautions Precautions: None Restrictions Weight Bearing Restrictions: No       Mobility   Balance    ADL Overall ADL's : Needs assistance/impaired Eating/Feeding: Minimal assistance Eating/Feeding Details (indicate cue type and reason): issued theratubing to build up utensils Grooming:  (given tubing for toothbrush)   Upper Body Bathing: Moderate assistance;Sitting   Lower Body Bathing: Moderate assistance;Sit to/from stand   Upper Body Dressing : Maximal assistance;Sitting   Lower Body Dressing: Moderate assistance;Sit to/from stand   Toilet Transfer: Supervision/safety   Toileting- Water quality scientist and Hygiene: Moderate assistance;Sit to/from stand       Functional mobility during ADLs: Mod I  General ADL Comments: discussed need to refrain from driving at this time.  Second session focused on compensatory techniques for feeding in addition to using theratubing to help hold utensils. Session focused on establishing a HEP as pt very motivated to exercise and "make his arms stronger"      Vision                     Perception     Praxis      Cognition   Behavior During Therapy: Impulsive Overall Cognitive Status: No  family/caregiver present to determine baseline cognitive functioning Area of Impairment:  (most likely at baseline)                General Comments: Unclear if this is pt's baseline.  pt confused and though he has been here 3 days when he had actually been admitted yesterday.      Extremity/Trunk Assessment  Upper Extremity Assessment Upper Extremity Assessment: Defer to OT evaluation RUE Deficits / Details: greater strength distally; grip strength @ 3/5. poor in hand manipulation skills. Able to  oppose all digits with the exception little finger; wrist flex/ext 3+/5; elbow flex/ext 3/5 shoulder flexabd 2/5; ER 2-/5; shoulder elevation/depression 3+/5 RUE Coordination: decreased fine motor;decreased gross motor LUE Deficits / Details: improved strength distally, but weaker promimally than RUE; @ 3=/5 grip; able to oppose thumb to all digits; elbow flex 2+/5; exlbo ext 3/5; shoulder FF;Abd;ER 2/5 LUE Coordination: decreased fine motor;decreased gross motor   Lower Extremity Assessment Lower Extremity Assessment: Overall WFL for tasks assessed   Cervical / Trunk Assessment Cervical / Trunk Assessment: Normal    Exercises Other Exercises Other Exercises: squeeze ball ex - B. grip and pinch strengtheing ex; pt has difficulty maintianing thumb in opposition Other Exercises: yellow theraputty ex - written handout given; also given coins in putty to work on fine motor/dexterity skills; increased ability to use L hand; encouraged pt to attempt to pull coins out of putty with hand, weaker hand Other Exercises: finger walking ex on table top to engage shoulder stabilization Other Exercises: towel slide ex on table top -  more difficulty with L UE; vc to decrease shoulder hiking B   Shoulder Instructions       General Comments      Pertinent Vitals/ Pain       Pain Assessment: No/denies pain  Home Living Family/patient expects to be discharged to:: Private residence Living  Arrangements: Spouse/significant other Available Help at Discharge: Family;Available 24 hours/day Type of Home: House Home Access: Stairs to enter [employer] of Steps: 2 Entrance Stairs-Rails: None Home Layout: Multi-level;Bed/bath upstairs Alternate Level Stairs-Number of Steps: flight Alternate Level Stairs-Rails: Left Bathroom Shower/Tub: Occupational psychologist: Standard     Home Equipment: None   Additional Comments: pt indicates his wife just finished Cancer treatment and unclear if she is able to A pt or if she has been needing A.    Lives With: Spouse    Prior Functioning/Environment Level of Independence: Independent            Frequency Min 3X/week     Progress Toward Goals  OT Goals(current goals can now be found in the care plan section)     Acute Rehab OT Goals Patient Stated Goal: to be able to use my arms OT Goal Formulation: With patient Time For Goal Achievement: 10/19/15 Potential to Achieve Goals: Good ADL Goals Pt Will Perform Eating: with modified independence;with adaptive utensils;sitting Pt Will Perform Grooming: with modified independence;sitting;with adaptive equipment Pt Will Perform Upper Body Bathing: with supervision;with set-up;with caregiver independent in assisting;sitting Pt Will Perform Upper Body Dressing: with set-up;with supervision;sitting;with caregiver independent in assisting Pt/caregiver will Perform Home Exercise Program: Increased ROM;Increased strength;Both right and left upper extremity;With theraputty;With written HEP provided (fine motor/coordination and BUE strengthening)  Plan Discharge plan remains appropriate    Co-evaluation    PT/OT/SLP Co-Evaluation/Treatment: Yes Reason for Co-Treatment: Necessary to address cognition/behavior during functional activity PT goals addressed during session: Mobility/safety with mobility;Balance OT goals addressed during session: ADL's and  self-care;Strengthening/ROM      End of Session Equipment Utilized During Treatment: Gait belt   Activity Tolerance Patient tolerated treatment well   Patient Left in chair;with call bell/phone within reach;with chair alarm set   Nurse Communication Mobility status;Other (comment) (D/C needs)        Time: [id number] OT Time Calculation (min): 23 min  Charges: OT General Charges $Self Care/Home Management : 8-22 mins $Therapeutic Activity: 8-22 mins  Marcayla Budge,HILLARY 10/12/2015, 2:01 PM   [facility], OTR/L  [phone] 10/12/2015

## 2015-10-12 NOTE — Evaluation (Signed)
Physical Therapy Evaluation Patient Details Name: Jake Garcia MRN: [id number] DOB: Aug 07, 1929 Today's Date: 10/12/2015   History of Present Illness  pt presents with UE weakness presenting like Central Cord Syndrome.  pt found to have C3-7 Stenosis, R Caudate, and Parietal Cortex infarcts.  pt with hx of CABG and HTN.    Clinical Impression  Pt very eager for mobility and is able to ambulate well without A.  Feel pt does not have any PT needs at this time and will sign off.      Follow Up Recommendations No PT follow up;Supervision for mobility/OOB    Equipment Recommendations  None recommended by PT    Recommendations for Other Services       Precautions / Restrictions Precautions Precautions: None Restrictions Weight Bearing Restrictions: No      Mobility  Bed Mobility Overal bed mobility: Needs Assistance Bed Mobility: Supine to Sit     Supine to sit: Supervision;HOB elevated     General bed mobility comments: pt needs increased time and difficulty utilizing UEs to A with coming to sitting.    Transfers Overall transfer level: Modified independent Equipment used: None Transfers: Sit to/from Stand Sit to Stand: Modified independent (Device/Increase time)         General transfer comment: No physical A needed.  pt somewhat impulsive.    Ambulation/Gait Ambulation/Gait assistance: Modified independent (Device/Increase time) Ambulation Distance (Feet): 250 Feet Assistive device: None Gait Pattern/deviations: Step-through pattern;Decreased stride length     General Gait Details: pt moving well, but can get easily distracted during mobility and needed re-direction to attend to task.  pt able to negotiate obstacles without deficit during ambulation.    Stairs            Wheelchair Mobility    Modified Rankin (Stroke Patients Only) Modified Rankin (Stroke Patients Only) Pre-Morbid Rankin Score: No significant disability Modified Rankin: No  significant disability     Balance Overall balance assessment: No apparent balance deficits (not formally assessed)                               Standardized Balance Assessment Standardized Balance Assessment : Dynamic Gait Index   Dynamic Gait Index Level Surface: Normal Change in Gait Speed: Mild Impairment Gait with Horizontal Head Turns: Mild Impairment Gait with Vertical Head Turns: Normal Gait and Pivot Turn: Normal Step Over Obstacle: Mild Impairment Step Around Obstacles: Normal       Pertinent Vitals/Pain Pain Assessment: No/denies pain    Home Living Family/patient expects to be discharged to:: Private residence Living Arrangements: Spouse/significant other Available Help at Discharge: Family;Available 24 hours/day Type of Home: House Home Access: Stairs to enter Entrance Stairs-Rails: None Entrance Stairs-Number of Steps: 2 Home Layout: Multi-level;Bed/bath upstairs Home Equipment: None Additional Comments: pt indicates his wife just finished Cancer treatment and unclear if she is able to A pt or if she has been needing A.      Prior Function Level of Independence: Independent               Hand Dominance        Extremity/Trunk Assessment   Upper Extremity Assessment: Defer to OT evaluation           Lower Extremity Assessment: Overall WFL for tasks assessed      Cervical / Trunk Assessment: Normal  Communication   Communication: HOH  Cognition Arousal/Alertness: Awake/alert Behavior During Therapy: Restless Overall Cognitive  Status: No family/caregiver present to determine baseline cognitive functioning                 General Comments: Unclear if this is pt's baseline.  pt confused and though he has been here 3 days when he had actually been admitted yesterday.      General Comments      Exercises        Assessment/Plan    PT Assessment Patent does not need any further PT services  PT Diagnosis  Difficulty walking   PT Problem List    PT Treatment Interventions     PT Goals (Current goals can be found in the Care Plan section) Acute Rehab PT Goals PT Goal Formulation: All assessment and education complete, DC therapy    Frequency     Barriers to discharge        Co-evaluation PT/OT/SLP Co-Evaluation/Treatment: Yes Reason for Co-Treatment: Necessary to address cognition/behavior during functional activity PT goals addressed during session: Mobility/safety with mobility;Balance OT goals addressed during session: ADL's and self-care;Strengthening/ROM       End of Session Equipment Utilized During Treatment: Gait belt Activity Tolerance: Patient tolerated treatment well Patient left: in chair (with OT) Nurse Communication: Mobility status         Time: [id number] PT Time Calculation (min) (ACUTE ONLY): 12 min   Charges:   PT Evaluation $Initial PT Evaluation Tier I: 1 Procedure     PT G CodesCatarina Hartshorn, [place] 10/12/2015, 12:54 PM

## 2015-10-13 DIAGNOSIS — I10 Essential (primary) hypertension: Secondary | ICD-10-CM

## 2015-10-13 DIAGNOSIS — E785 Hyperlipidemia, unspecified: Secondary | ICD-10-CM

## 2015-10-13 MED ORDER — ASPIRIN 325 MG PO TABS: 325.0000 mg | ORAL_TABLET | Freq: Every day | ORAL | Status: DC

## 2015-10-13 MED ORDER — ATORVASTATIN CALCIUM 40 MG PO TABS: 40.0000 mg | ORAL_TABLET | Freq: Every day | ORAL | Status: DC

## 2015-10-13 NOTE — Care Management Note (Signed)
Case Management Note  Patient Details  Name: Jake Garcia MRN: [id number] Date of Birth: 11/12/1929  Subjective/Objective:   Received referral to arrange outpatient OT for pt.  Pt lives in [place] and requests [facility] for his therapy.  They state they must have order from pt's PCP before they can provide services.  Discussed with pt who does not give permission for CM to call Dr Laurian Brim, states he is going to call Dr Hardin Negus to make an appt and wants to wait until he is seen at his office before he requests referral.  CM provided contact information for [facility].                             Expected Discharge Plan:  Home/Self Care  Discharge planning Services  CM Consult  Status of Service:  Completed, signed off  Baylon Santelli, Kym Groom, [place] 10/13/2015, 10:08 AM

## 2015-10-13 NOTE — Progress Notes (Signed)
No acute events. Patient eager to go home. AVSS Awake, alert, oriented Deltoids 2/5 bilaterally, o/w 5/5 Improving Patient may be discharged, outpatient PT and OT Follow up with me in 2 weeks

## 2015-10-13 NOTE — Discharge Summary (Signed)
Physician Discharge Summary  TREVEN PEGAN [id number] DOB: 15-Jun-1929 DOA: 10/11/2015  PCP: Morton Peters, MD  Admit date: 10/11/2015 Discharge date: 10/13/2015  Time spent: 35 minutes  Recommendations for Outpatient Follow-up:  1. Outpatient OT- patient refused to let us arrange 2. Outpatient neuro follow up- Dr. Leonie Man 3. Cognitive evaluation- suspect some dementia  Discharge Diagnoses:  Active Problems:   Central cord syndrome [facility])   Cerebral thrombosis with cerebral infarction   Discharge Condition: improved  Diet recommendation: regular  Filed Weights   10/11/15 0320 10/12/15 0400 10/13/15 0500  Weight: 72.576 kg (160 lb) 71.2 kg (156 lb 15.5 oz) 74.8 kg (164 lb 14.5 oz)    History of present illness:  81yo caucasian male with PMHx of CAD, HTN, HLD who presented to the ED with sudden onset of bilateral upper extremity weakness. Per patient, this is of sudden onset where he went to sleep last night at 8pm and awoke with the inability to move his arms. Per report, he had been painting over his head. His sensation is intact but has motor dysfunction with a slight improvement in his right upper extremity.  Hospital Course:  likely central cord syndrome Acute CVA, punctate in the right caudate head, 10mm cortical ischemic infarct in the right parietal lobe. F/u neurosurgery and neurology Echo ok  Carotid ok ASA  HTN -stable  Hyperlipidemia  Home meds: No statin  LDL 118, goal < 70  Now on lipitor 40  Continue statin at discharge  Procedures:  Echo  carotid  Consultations:  NS  neuro  Discharge Exam: Filed Vitals:   10/13/15 0821  BP: 108/76  Pulse: 67  Temp: 98.5 F (36.9 C)  Resp: 19    Deltoids 2/5 bilaterally, o/w 5/5  Discharge Instructions   Discharge Instructions    Diet general    Complete by:  As directed      Discharge instructions    Complete by:  As directed   No driving until seen by Dr. Cyndy Freeze Outpatient PT      Increase activity slowly    Complete by:  As directed           Current Discharge Medication List    START taking these medications   Details  aspirin 325 MG tablet Take 1 tablet (325 mg total) by mouth daily. Qty: 30 tablet, Refills: 0    atorvastatin (LIPITOR) 40 MG tablet Take 1 tablet (40 mg total) by mouth daily at 6 PM. Qty: 30 tablet, Refills: 0      CONTINUE these medications which have NOT CHANGED   Details  CALCIUM PO Take 1 tablet by mouth daily.    MAGNESIUM PO Take 1 tablet by mouth daily.    Multiple Vitamins-Minerals (EYE VITAMINS PO) Take 1 tablet by mouth daily.    Multiple Vitamins-Minerals (ZINC PO) Take 1 tablet by mouth daily.      STOP taking these medications     amLODipine (NORVASC) 10 MG tablet      metoprolol succinate (TOPROL-XL) 25 MG 24 hr tablet        No Known Allergies Follow-up Information    Follow up with Morton Peters, MD In 1 week.   Specialty:  Family Medicine   Contact information:   [place] [address] [phone]       Follow up with Kevan Ny Ditty, MD In 2 weeks.   Specialty:  Neurosurgery   Contact information:   [address] [place] [address] [phone]        The results of significant diagnostics from this hospitalization (including imaging, microbiology, ancillary and laboratory) are listed below for reference.    Significant Diagnostic Studies: Mr Virgel Paling Wo Contrast  10/11/2015  CLINICAL DATA:  Bilateral upper extremity weakness and numbness. Two punctate acute nonhemorrhagic infarcts within the right MCA distribution. EXAM: MRA HEAD WITHOUT CONTRAST TECHNIQUE: Angiographic images of the Circle of Willis were obtained using MRA technique without intravenous contrast. COMPARISON:  MRI brain from the same day. FINDINGS: The internal carotid arteries are within normal limits from the high cervical segments through the ICA termini. The A1 and M1 segments  are normal. The anterior communicating artery is patent. The MCA bifurcations are intact. The proximal MCA branch vessels are within normal limits bilaterally. The left vertebral artery is dominant. The right PICA origin is just below the field of view. The left PICA is not visualized. Bilateral AICA vessels are present. The basilar artery is within normal limits. The left posterior cerebral artery originates from the basilar tip. There is focal signal loss in the proximal left posterior cerebral artery. The right posterior cerebral artery is of fetal type. PCA branch vessels are intact bilaterally. IMPRESSION: 1. Focal signal loss in the proximal left posterior cerebral artery is likely artifactual given the absence of significant ischemic change in this territory. 2. No other significant proximal stenosis, aneurysm, or branch vessel occlusion to account for the acute infarcts. Electronically Signed   By: San Morelle M.D.   On: 10/11/2015 13:55   Mr Brain Wo Contrast  10/11/2015  CLINICAL DATA:  Initial evaluation to move bilateral upper extremities, acute onset. EXAM: MRI HEAD WITHOUT CONTRAST TECHNIQUE: Multiplanar, multiecho pulse sequences of the brain and surrounding structures were obtained without intravenous contrast. COMPARISON:  Prior study from 06/30/2010. FINDINGS: Punctate 5 mm ischemic infarct within the right caudate head. Additional tiny 8 mm cortical infarct within the right parietal lobe. No associated hemorrhage or mass effect. No other infarct. Normal intravascular flow voids are preserved. Diffuse prominence of the CSF containing spaces is compatible with generalized age-related cerebral atrophy. Patchy T2/FLAIR hyperintensity within the periventricular and deep white matter most consistent with chronic small vessel ischemic disease. Remote lacunar infarcts present within the bilateral thalami. Small remote lacunar infarct within the left caudate head. Multiple remote bilateral  cerebellar infarcts present as well. Small remote lacunar infarct within the anterior right corona radiata/centrum semi ovale with associated chronic micro hemorrhage present as well. No mass lesion, midline shift, or mass effect. Ventricular prominence related to global parenchymal volume loss present without hydrocephalus. No extra-axial fluid collection. Craniocervical junction within normal limits. Pituitary gland normal.  No acute abnormality about the orbits. Scattered mucosal thickening with throughout the paranasal sinuses. No mastoid effusion. Inner ear structures grossly normal. Bone marrow signal intensity within normal limits. No scalp soft tissue abnormality. IMPRESSION: 1. Punctate 5 mm acute ischemic nonhemorrhagic infarct within the right caudate head. 2. Small 8 mm cortical ischemic nonhemorrhagic infarct within the right parietal lobe. 3. Atrophy with mild chronic small vessel ischemic disease and multiple remote lacunar infarcts as above. Electronically Signed   By: Jeannine Boga M.D.   On: 10/11/2015 05:37   Mr Cervical Spine Wo Contrast  10/11/2015  CLINICAL DATA:  Initial evaluation for acute onset inability to move bilateral upper extremities. EXAM: MRI CERVICAL SPINE WITHOUT CONTRAST TECHNIQUE: Multiplanar, multisequence MR imaging of the cervical spine was performed. No intravenous  contrast was administered. COMPARISON:  None. FINDINGS: Visualized portions of the brain and posterior fossa demonstrate no acute abnormality. Craniocervical junction widely patent. Straightening with slight reversal of the normal cervical lordosis with apex at C4. There is 3 mm anterolisthesis of C7 on T1. Trace retrolisthesis of C5 on C6. This is likely chronic in nature. No fracture. Signal intensity within the vertebral body bone marrow is normal. No marrow edema. No focal osseous lesion. Signal intensity within the cervical spinal cord is normal. No paraspinous soft tissue abnormality.  No  prevertebral edema. C2-C3: Mild bilateral uncovertebral hypertrophy, slightly worse on the right. Superimposed right-sided facet arthrosis. There is resultant moderate right foraminal stenosis. No significant left foraminal narrowing. Central canal is widely patent. C3-C4: Diffuse degenerative disc osteophyte with bilateral uncovertebral hypertrophy. Left greater than right facet arthrosis. There is resultant moderate to severe left with moderate right foraminal stenosis. Broad-based posterior disc bulge indents and largely effaces the ventral thecal sac and results in moderate canal narrowing. C4-C5: Diffuse degenerative disc osteophyte with intervertebral disc space narrowing and bilateral uncovertebral hypertrophy. Mild bilateral facet arthrosis. There is resultant fairly severe bilateral foraminal stenosis. Broad-based posterior central/left paracentral disc osteophyte flattens and largely effaces the ventral thecal sac and results in moderate canal stenosis. Minimal flattening of the cervical spinal cord without cord signal changes. C5-C6: There is a predominantly central/ right paracentral disc osteophyte complex that indents the right ventral thecal sac and flattens the right aspect of the cervical spinal cord. No cord signal changes. Superimposed bilateral uncovertebral hypertrophy and facet arthrosis. There is resultant severe right with moderate left foraminal narrowing. Moderate canal stenosis. C6-C7: Broad-based degenerative disc osteophyte with mild uncovertebral hypertrophy and prominent intervertebral disc space narrowing. There is resultant moderate bilateral foraminal stenosis, slightly worse on the left. Broad-based posterior disc osteophyte mildly effaces the ventral thecal sac and results in mild to moderate canal stenosis. C7-T1: 3 mm anterolisthesis of C7 on T1. Secondary mild diffuse disc bulge without focal disc herniation. Ligamentum flavum hypertrophy and bilateral facet arthrosis. There is  resultant mild left foraminal stenosis. No significant right foraminal canal stenosis. Visualized upper thoracic spine demonstrates no acute abnormality. IMPRESSION: 1. No acute abnormality within the cervical spine. 2. 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 There is resultant moderate canal stenosis at C3-4 through C6-7. Please see above report for a full description of these findings. Electronically Signed   By: Jeannine Boga M.D.   On: 10/11/2015 06:07    Microbiology: Recent Results (from the past 240 hour(s))  MRSA PCR Screening     Status: None   Collection Time: 10/11/15  7:58 AM  Result Value Ref Range Status   MRSA by PCR NEGATIVE NEGATIVE Final    Comment:        The GeneXpert MRSA Assay (FDA approved for NASAL specimens only), is one component of a comprehensive MRSA colonization surveillance program. It is not intended to diagnose MRSA infection nor to guide or monitor treatment for MRSA infections.      Labs: Basic Metabolic Panel:  Recent Labs Lab 10/11/15 0321 10/11/15 0329 10/12/15 0244  NA 138 144 141  K 3.6 3.6 4.1  CL 108 109 113*  CO2 22  --  20*  GLUCOSE 158* 159* 176*  BUN 25* 28* 32*  CREATININE 2.26* 2.30* 1.85*  CALCIUM 8.6*  --  9.0  MG  --   --  2.1  PHOS  --   --  2.8   Liver Function Tests:  Recent Labs Lab 10/11/15 0321  AST 22  ALT 16*  ALKPHOS 67  BILITOT 0.4  PROT 6.7  ALBUMIN 3.5   No results for input(s): LIPASE, AMYLASE in the last 168 hours. No results for input(s): AMMONIA in the last 168 hours. CBC:  Recent Labs Lab 10/11/15 0321 10/11/15 0329 10/12/15 0244  WBC 8.8  --  7.4  NEUTROABS 5.6  --   --   HGB 10.9* 12.2* 10.2*  HCT 33.3* 36.0* 31.8*  MCV 62.4*  --  62.0*  PLT 281  --  269   Cardiac Enzymes: No results for input(s): CKTOTAL, CKMB, CKMBINDEX,  TROPONINI in the last 168 hours. BNP: BNP (last 3 results) No results for input(s): BNP in the last 8760 hours.  ProBNP (last 3 results) No results for input(s): PROBNP in the last 8760 hours.  CBG: No results for input(s): GLUCAP in the last 168 hours.     SignedEulogio Bear  [facility] 10/13/2015, 11:25 AM

## 2015-10-13 NOTE — Patient Instructions (Signed)
ROM: Cross (Horizontal Abduction / Adduction)    Reach right arm across body as far as possible, then pull arm out from side. Repeat ____ times per set. Do ____ sets per session. Do ____ sessions per day.  http://orth.exer.us/798   Copyright  VHI. All rights reserved.  ROM: Pendulum (Circular)    Let right arm move in circle clockwise, then counterclockwise, by rocking body weight in circular pattern. Circle ____ times each direction per set. Do ____ sets per session. Do ____ sessions per day.  http://orth.exer.us/794   Copyright  VHI. All rights reserved.

## 2015-10-13 NOTE — Progress Notes (Signed)
Occupational Therapy Treatment Patient Details Name: Jake Garcia MRN: [id number] DOB: 1929/08/01 Today's Date: 10/13/2015    History of present illness pt presents with UE weakness presenting like Central Cord Syndrome.  pt found to have C3-7 Stenosis, R Caudate, and Parietal Cortex infarcts.  pt with hx of CABG and HTN.     OT comments  Demonstrating improvement in BUE strength with gravity eliminated. LUE stronger than R.  Attempted to educate pt on compensatory techniques for ADL. Given additional strengthening ex. Recommend pt continue to follow up with outpt OT  Follow Up Recommendations  Outpatient OT;Supervision - Intermittent    Equipment Recommendations  None recommended by OT    Recommendations for Other Services      Precautions / Restrictions Precautions Precautions: None       Mobility Bed Mobility               General bed mobility comments: needs assist to translate trunk from supine to sitting  Transfers Overall transfer level: Modified independent                    Balance        no apparent balance deficits                           ADL                                       Functional mobility during ADLs: Modified independent General ADL Comments: Attempted to educate pt on compensatory techniques for ADL. Gave suggestions of best clothing types to wear giiven BUE weakness adn difficulty with fine motor skills. Pt states he used the tubing for meals and it helped a "great deal". Reviewed home safety and reducing risk of falls.                                       Cognition   Behavior During Therapy: Impulsive Overall Cognitive Status: No family/caregiver present to determine baseline cognitive functioning                       Extremity/Trunk Assessment     Improving BUE strength. Increased strength distally L hand. R remains the same. Minimal increase in strength B  shoulder. Until to lift against gravity. @ 2/5 FF/ Abd.          Exercises Shoulder Exercises Shoulder Flexion: AAROM;Both;20 reps;Supine Shoulder Extension: AAROM;20 reps;Supine Shoulder ABduction: AAROM;Both;20 reps Shoulder External Rotation: AAROM;Both;20 reps Elbow Flexion: AROM;20 reps;Supine Elbow Extension: AROM;Both;20 reps;Supine Wrist Flexion: AROM;Both;20 reps Wrist Extension: AROM;Both;20 reps Digit Composite Flexion: AROM;Both;20 reps Composite Extension: AROM;Both;Supine Other Exercises Other Exercises: BUE - PNF D1 diagonals in supine with min A Other Exercises: place and hold at 90 shoulder flex/rythmic stabilization   Cues to complete exercises with control and to not "throw arms " around. Stressed importance of controlling movement in order to avoid soft tissue injury. Pt verbalized understanding.    Shoulder Instructions       General Comments      Pertinent Vitals/ Pain       Pain Assessment: No/denies pain  Home Living  Prior Functioning/Environment              Frequency Min 3X/week     Progress Toward Goals  OT Goals(current goals can now be found in the care plan section)  Progress towards OT goals: Progressing toward goals  Acute Rehab OT Goals Patient Stated Goal: to be able to use my arms OT Goal Formulation: With patient Time For Goal Achievement: 10/19/15 Potential to Achieve Goals: Good ADL Goals Pt Will Perform Eating: with modified independence;with adaptive utensils;sitting Pt Will Perform Grooming: with modified independence;sitting;with adaptive equipment Pt Will Perform Upper Body Bathing: with supervision;with set-up;with caregiver independent in assisting;sitting Pt Will Perform Upper Body Dressing: with set-up;with supervision;sitting;with caregiver independent in assisting Pt/caregiver will Perform Home Exercise Program: Increased ROM;Increased strength;Both  right and left upper extremity;With theraputty;With written HEP provided  Plan Discharge plan remains appropriate    Co-evaluation                 End of Session     Activity Tolerance Patient tolerated treatment well   Patient Left with call bell/phone within reach;in bed   Nurse Communication Mobility status;Other (comment) (Pt ready to D/C)        Time: [id number] OT Time Calculation (min): 44 min  Charges: OT General Charges $OT Visit: 1 Procedure OT Treatments $Self Care/Home Management : 8-22 mins $Therapeutic Exercise: 23-37 mins  Kuba Shepherd,[place] 10/13/2015, 12:12 PM   [facility], OTR/L  [phone] 10/13/2015

## 2015-10-13 NOTE — Progress Notes (Signed)
Pt awoke and wripped everything off trying to get to the bathroom. Pt was extremely mad, yelling at staff that he was "locked in a cage, and wants to be sure he's going home tomorrow because he cannot handle being here anymore" Staff got pt settled and pt apologized and is now resting in bed.  RN will continue to monitor.

## 2015-10-19 DIAGNOSIS — G459 Transient cerebral ischemic attack, unspecified: Secondary | ICD-10-CM | POA: Diagnosis not present

## 2015-10-20 ENCOUNTER — Ambulatory Visit: Payer: Medicare Other | Admitting: Occupational Therapy

## 2015-10-20 ENCOUNTER — Encounter: Payer: Self-pay | Admitting: Occupational Therapy

## 2015-10-20 DIAGNOSIS — R29898 Other symptoms and signs involving the musculoskeletal system: Secondary | ICD-10-CM | POA: Insufficient documentation

## 2015-10-20 NOTE — Patient Instructions (Signed)
Home exercise program 

## 2015-10-20 NOTE — Therapy (Signed)
[place] MAIN [facility] SERVICES [address] [place], [place], [address] Phone: [phone]   Fax:  [phone]  Occupational Therapy Evaluation  Patient Details  Name: Jake Garcia MRN: [id number] Date of Birth: [birth year] No Data Recorded  Encounter Date: 10/20/2015      OT End of Session - 10/20/15 1440    Visit Number 1   Number of Visits 24   Date for OT Re-Evaluation 01/12/16   OT Start Time 47      Past Medical History  Diagnosis Date  . Coronary artery disease   . Hyperlipidemia   . Hypertension     Past Surgical History  Procedure Laterality Date  . Coronary artery bypass graft  11/24/2008  . Cardiac catheterization  11/20/2008    There were no vitals filed for this visit.  Visit Diagnosis:  Weakness of both arms - Plan: Ot plan of care cert/re-cert      Subjective Assessment - 10/20/15 1435    Subjective  They did not tell me what I had.   Patient is accompained by: Family member   Pertinent History Patient reports 9 days ago woke up and could notd move his arms.   Limitations B UE weakness.   Pain Score 0-No pain    Completed eccentric shoulder exercises. Reviewed Occupational Therapy home program from [place] (perfect).                          OT Education - 10/20/15 1438    Education provided Yes   Education Details what central cord syndrome.   Person(s) Educated Patient;Spouse   Methods Explanation   Comprehension Verbalized understanding             OT Long Term Goals - 10/20/15 1502    OT LONG TERM GOAL #1   Title Patient will improve B UE strength and range to be able to comb hair.   Baseline Unable   Time 12   Period Weeks   Status New   OT LONG TERM GOAL #2   Title Patient will be abe to self shave efficiently.   Baseline Unable   Time 12   Period Weeks   Status New   OT LONG TERM GOAL #3   Title Patient will be able to donn shirt and jacket on.   Baseline needs assist   Time 12   Period Weeks   Status New   OT LONG TERM GOAL #4   Title Patient will be able to pull up pants.   Baseline unable   Time 12   Period Weeks   Status New   OT LONG TERM GOAL #5   Title Will be able to donn socks   Baseline unable to donn socks   Time 12   Period Weeks   Status New               Plan - 10/20/15 1442    Clinical Impression Statement R UE shoulder flexion 40o, abduction 40o strength 2-/5 elbow motions full, flexion 4/5 extension 3/5, forearm full range 4/5, wrist full motion wrist flexion and extenion 3+/5 grip 15 lbs - L ur shoulder flexion 31o, abduction 33o elbow full range but labored elbow flexion 3/5, extension 3+/5 supination limited to 45o,  wrist and forearm 4-/5 grip  44 lbs. This is interfereing with ADL. deficits listed in goals.   Pt will benefit from skilled therapeutic intervention in order to  improve on the following deficits (Retired) Decreased activity tolerance;Decreased coordination;Decreased endurance;Decreased range of motion;Impaired flexibility;Decreased strength   OT Frequency 2x / week   OT Duration 12 weeks   OT Treatment/Interventions Self-care/ADL training;Moist Heat;DME and/or AE instruction;Energy conservation;Neuromuscular education;Therapeutic exercise;Manual Therapy;Patient/family education;Therapeutic activities;Therapeutic exercises   Plan OT 2 x per week    OT Home Exercise Plan For upper extremities.   Consulted and Agree with Plan of Care Patient;Family member/caregiver          G-Codes - 25-Oct-2015 1510    Functional Assessment Tool Used clinical judgement   Functional Limitation Self care   Self Care Current Status [phone]) At least 60 percent but less than 80 percent impaired, limited or restricted   Self Care Goal Status [id number]) At least 1 percent but less than 20 percent impaired, limited or restricted      Problem List Patient Active Problem List   Diagnosis Date Noted  .  Cerebral thrombosis with cerebral infarction 10/12/2015  . Central cord syndrome ([place]) 10/11/2015  . Carotid stenosis 06/25/2013  . Preop cardiovascular exam 06/25/2013  . CAD (coronary artery disease) 08/05/2011  . Hyperlipidemia 08/05/2011  . HTN (hypertension) 08/05/2011  . Chest discomfort 08/05/2011   Sharon Mt, MS/OTR/L  Sharon Mt 10-25-15, 3:46 PM  [place] MAIN [facility] SERVICES [address] [place], [place], [address] Phone: [phone]   Fax:  [phone]  Name: Jake Garcia MRN: [id number] Date of Birth: [birth year]

## 2015-10-22 ENCOUNTER — Ambulatory Visit: Payer: Medicare Other | Admitting: Occupational Therapy

## 2015-10-22 ENCOUNTER — Encounter: Payer: Self-pay | Admitting: Occupational Therapy

## 2015-10-22 DIAGNOSIS — R29898 Other symptoms and signs involving the musculoskeletal system: Secondary | ICD-10-CM | POA: Diagnosis not present

## 2015-10-22 NOTE — Therapy (Signed)
[place] MAIN [facility] SERVICES [address] [place], [place], [address] Phone: [phone]   Fax:  [phone]  Occupational Therapy Treatment  Patient Details  Name: Jake Garcia MRN: [id number] Date of Birth: 1929/12/01 No Data Recorded  Encounter Date: 10/22/2015      OT End of Session - 10/22/15 1637    Visit Number 2   Number of Visits 24   Date for OT Re-Evaluation 01/12/16   OT Start Time 1430   OT Stop Time 1515   OT Time Calculation (min) 45 min      Past Medical History  Diagnosis Date  . Coronary artery disease   . Hyperlipidemia   . Hypertension     Past Surgical History  Procedure Laterality Date  . Coronary artery bypass graft  11/24/2008  . Cardiac catheterization  11/20/2008    There were no vitals filed for this visit.  Visit Diagnosis:  Weakness of both arms      Subjective Assessment - 10/22/15 1635    Subjective  What causes this?   Pertinent History Patient reports 9 days ago woke up and could notd move his arms.     Supine bilateral (indevidually) upper extremity active assistive range of motion  exercises shoulder flexion, external and internal rotation resistive elbow flexion and extension, forearm supination and pronation, wrist flexion and extension, and hand flexion and extension 10 repetitions. Individual nerve glide both upper extremities x 10 reps. Picked up coins largest to smallest and placed in coin counter both hands. Used color coded clips from hardest to easiest for pinch. Both hands Used Judy/instructo board to flip pegs                           OT Education - 10/22/15 1637    Education Details Used spine  model to explain what causes his weakness.   Person(s) Educated Patient   Methods Explanation;Demonstration   Comprehension Verbalized understanding             OT Long Term Goals - 10/20/15 1502    OT LONG TERM GOAL #1   Title Patient will improve B UE  strength and range to be able to comb hair.   Baseline Unable   Time 12   Period Weeks   Status New   OT LONG TERM GOAL #2   Title Patient will be abe to self shave efficiently.   Baseline Unable   Time 12   Period Weeks   Status New   OT LONG TERM GOAL #3   Title Patient will be able to donn shirt and jacket on.   Baseline needs assist   Time 12   Period Weeks   Status New   OT LONG TERM GOAL #4   Title Patient will be able to pull up pants.   Baseline unable   Time 12   Period Weeks   Status New   OT LONG TERM GOAL #5   Title Will be able to donn socks   Baseline unable to donn socks   Time 12   Period Weeks   Status New               Plan - 10/22/15 1638    Clinical Impression Statement Patient demonstrating good motivation nd working hard during session.   Pt will benefit from skilled therapeutic intervention in order to improve on the following deficits (Retired) Decreased activity tolerance;Decreased coordination;Decreased  endurance;Decreased range of motion;Impaired flexibility;Decreased strength   OT Treatment/Interventions Self-care/ADL training;Moist Heat;DME and/or AE instruction;Energy conservation;Neuromuscular education;Therapeutic exercise;Manual Therapy;Patient/family education;Therapeutic activities;Therapeutic exercises        Problem List Patient Active Problem List   Diagnosis Date Noted  . Cerebral thrombosis with cerebral infarction 10/12/2015  . Central cord syndrome ([place]) 10/11/2015  . Carotid stenosis 06/25/2013  . Preop cardiovascular exam 06/25/2013  . CAD (coronary artery disease) 08/05/2011  . Hyperlipidemia 08/05/2011  . HTN (hypertension) 08/05/2011  . Chest discomfort 08/05/2011   Sharon Mt, MS/OTR/L  Sharon Mt 10/22/2015, 4:42 PM  [place] MAIN [facility] SERVICES [address] [place], [place], [address] Phone: [phone]   Fax:  [phone]  Name: JAMAL HASSINGER MRN:  [id number] Date of Birth: Jun 03, 1929

## 2015-10-22 NOTE — Patient Instructions (Signed)
Educated/reinforced home program

## 2015-10-26 ENCOUNTER — Encounter: Payer: Self-pay | Admitting: Occupational Therapy

## 2015-10-26 ENCOUNTER — Ambulatory Visit: Payer: Medicare Other | Admitting: Occupational Therapy

## 2015-10-26 DIAGNOSIS — R29898 Other symptoms and signs involving the musculoskeletal system: Secondary | ICD-10-CM | POA: Diagnosis not present

## 2015-10-26 NOTE — Therapy (Signed)
[place] MAIN [facility] SERVICES [name] [place], [place], [address] Phone: [phone]   Fax:  [phone]  Occupational Therapy Treatment  Patient Details  Name: Jake Garcia MRN: [id number] Date of Birth: 19-Mar-1929 No Data Recorded  Encounter Date: 10/26/2015      OT End of Session - 10/26/15 1334    Visit Number 3   Number of Visits 24   Date for OT Re-Evaluation 01/12/16   OT Start Time 1300   OT Stop Time 1345   OT Time Calculation (min) 45 min      Past Medical History  Diagnosis Date  . Coronary artery disease   . Hyperlipidemia   . Hypertension     Past Surgical History  Procedure Laterality Date  . Coronary artery bypass graft  11/24/2008  . Cardiac catheterization  11/20/2008    There were no vitals filed for this visit.  Visit Diagnosis:  Weakness of both arms      Subjective Assessment - 10/26/15 1331    Subjective  I thought I was doing better but now not so much.   Currently in Pain? No/denies    In supine both upper extremities  exercises  Active assistive shoulder flexion, external and internal rotation light resistance elbow flexion and extension, forearm supination and pronation, wrist flexion and extension, and hand flexion and extension 10 repetitions. Eccentric shoulder flexion in sitting. UBE moderate resistance, 2 min forward 1 min backward with cues for technique. Used grooved peg board placing pegs in order and removed alternating fingers both hands. 1 lb. Weight elbow flexion, forearm supination and pronation, wrist flexion and extension. Gripper set on 17.9, x 20 each hand.                               OT Long Term Goals - 10/20/15 1502    OT LONG TERM GOAL #1   Title Patient will improve B UE strength and range to be able to comb hair.   Baseline Unable   Time 12   Period Weeks   Status New   OT LONG TERM GOAL #2   Title Patient will be abe to self shave  efficiently.   Baseline Unable   Time 12   Period Weeks   Status New   OT LONG TERM GOAL #3   Title Patient will be able to donn shirt and jacket on.   Baseline needs assist   Time 12   Period Weeks   Status New   OT LONG TERM GOAL #4   Title Patient will be able to pull up pants.   Baseline unable   Time 12   Period Weeks   Status New   OT LONG TERM GOAL #5   Title Will be able to donn socks   Baseline unable to donn socks   Time 12   Period Weeks   Status New               Plan - 10/26/15 1336    OT Treatment/Interventions Self-care/ADL training;Moist Heat;DME and/or AE instruction;Energy conservation;Neuromuscular education;Therapeutic exercise;Manual Therapy;Patient/family education;Therapeutic activities;Therapeutic exercises        Problem List Patient Active Problem List   Diagnosis Date Noted  . Cerebral thrombosis with cerebral infarction 10/12/2015  . Central cord syndrome ([place]) 10/11/2015  . Carotid stenosis 06/25/2013  . Preop cardiovascular exam 06/25/2013  . CAD (coronary artery disease) 08/05/2011  .  Hyperlipidemia 08/05/2011  . HTN (hypertension) 08/05/2011  . Chest discomfort 08/05/2011   Sharon Mt, MS/OTR/L  Sharon Mt 10/26/2015, 1:44 PM  [place] MAIN [facility] SERVICES [address] [place], [place], [address] Phone: [phone]   Fax:  [phone]  Name: Jake Garcia MRN: [id number] Date of Birth: November 03, 1929

## 2015-11-02 ENCOUNTER — Ambulatory Visit: Payer: Medicare Other | Admitting: Occupational Therapy

## 2015-11-02 DIAGNOSIS — R29898 Other symptoms and signs involving the musculoskeletal system: Secondary | ICD-10-CM

## 2015-11-02 NOTE — Therapy (Signed)
[place] MAIN [facility] SERVICES [address] [place], [place], [address] Phone: [phone]   Fax:  [phone]  Occupational Therapy Treatment  Patient Details  Name: Jake Garcia MRN: [id number] Date of Birth: 09-Sep-1929 No Data Recorded  Encounter Date: 11/02/2015      OT End of Session - 11/02/15 1442    Visit Number 4   Number of Visits 24   Date for OT Re-Evaluation 01/12/16   OT Start Time 1345   OT Stop Time 1430   OT Time Calculation (min) 45 min      Past Medical History  Diagnosis Date  . Coronary artery disease   . Hyperlipidemia   . Hypertension     Past Surgical History  Procedure Laterality Date  . Coronary artery bypass graft  11/24/2008  . Cardiac catheterization  11/20/2008    There were no vitals filed for this visit.  Visit Diagnosis:  Weakness of both arms      Subjective Assessment - 11/02/15 1438    Subjective  Some days are better than others.   Pain Score 0-No pain    Bilateral upper extremity active assistive exercises shoulder flexion, external and internal rotation 2 lb weight in sitting elbow flexion and extension, forearm supination and pronation, wrist flexion and extension,  6 repetitions. (left elbow flexion could only tolerate 1 lb). In sitting eccentric shoulder flexion separate then together x 6, UBE backwards in standing to incorporate shoulder flexion, Nerve glides, chin tucks, and wall climbs completed. Patient needed cues for technique and proper positioning.                          OT Education - 11/02/15 1440    Education provided Yes   Education Details Reinforced cause of weakness.   Methods Explanation   Comprehension Verbalized understanding             OT Long Term Goals - 10/20/15 1502    OT LONG TERM GOAL #1   Title Patient will improve B UE strength and range to be able to comb hair.   Baseline Unable   Time 12   Period Weeks   Status New    OT LONG TERM GOAL #2   Title Patient will be abe to self shave efficiently.   Baseline Unable   Time 12   Period Weeks   Status New   OT LONG TERM GOAL #3   Title Patient will be able to donn shirt and jacket on.   Baseline needs assist   Time 12   Period Weeks   Status New   OT LONG TERM GOAL #4   Title Patient will be able to pull up pants.   Baseline unable   Time 12   Period Weeks   Status New   OT LONG TERM GOAL #5   Title Will be able to donn socks   Baseline unable to donn socks   Time 12   Period Weeks   Status New               Plan - 11/02/15 1442    Clinical Impression Statement Patient now able to complete wall climbs with both hands.   Pt will benefit from skilled therapeutic intervention in order to improve on the following deficits (Retired) Decreased activity tolerance;Decreased coordination;Decreased endurance;Decreased range of motion;Impaired flexibility;Decreased strength   OT Treatment/Interventions Self-care/ADL training;Moist Heat;DME and/or AE instruction;Energy conservation;Neuromuscular education;Therapeutic exercise;Manual  Therapy;Patient/family education;Therapeutic activities;Therapeutic exercises        Problem List Patient Active Problem List   Diagnosis Date Noted  . Cerebral thrombosis with cerebral infarction 10/12/2015  . Central cord syndrome ([place]) 10/11/2015  . Carotid stenosis 06/25/2013  . Preop cardiovascular exam 06/25/2013  . CAD (coronary artery disease) 08/05/2011  . Hyperlipidemia 08/05/2011  . HTN (hypertension) 08/05/2011  . Chest discomfort 08/05/2011    Myrene Galas, MS/OTR/L  11/02/2015, 2:45 PM  [place] MAIN [facility] SERVICES [address] [place], [place], [address] Phone: [phone]   Fax:  [phone]  Name: GERMANY SCAFF MRN: [id number] Date of Birth: 1929/11/16

## 2015-11-02 NOTE — Patient Instructions (Signed)
Instructed patient to do nerve glides, chin tucks, and wall climes.

## 2015-11-04 ENCOUNTER — Encounter: Payer: Self-pay | Admitting: Occupational Therapy

## 2015-11-04 ENCOUNTER — Ambulatory Visit: Payer: Medicare Other | Admitting: Occupational Therapy

## 2015-11-04 DIAGNOSIS — R29898 Other symptoms and signs involving the musculoskeletal system: Secondary | ICD-10-CM

## 2015-11-04 NOTE — Patient Instructions (Signed)
Continue with home program

## 2015-11-04 NOTE — Therapy (Deleted)
[place] [facility] MAIN REHAB SERVICES [address] [place], [place], [address] Phone: [phone]   Fax:  [phone]  Occupational Therapy Treatment  Patient Details  Name: Jake Garcia MRN: [id number] Date of Birth: 03/09/1929 No Data Recorded  Encounter Date: 11/04/2015    Past Medical History  Diagnosis Date  . Coronary artery disease   . Hyperlipidemia   . Hypertension     Past Surgical History  Procedure Laterality Date  . Coronary artery bypass graft  11/24/2008  . Cardiac catheterization  11/20/2008    There were no vitals filed for this visit.  Visit Diagnosis:  Weakness of both arms  Supine bilateral upper extremity  exercises active assistive shoulder flexion, active external and internal rotation resisted elbow flexion and extension, forearm supination and pronation, wrist flexion and extension, 5 repetitions. Chin tucks at wall, wall climbs, Used color coded clips from hardest to easiest for pinch both hands. 4 lb weight bicep curls x 25 on right left 1 lb weight x 25.  Completed connect 4 for reaching both arms.                                         OT Long Term Goals - 01/11/16 1351    OT LONG TERM GOAL #1   Title Patient will improve B UE strength and range to be able to comb hair.   Time 12   Period Weeks   Status Achieved   OT LONG TERM GOAL #2   Title Patient will be abe to self shave efficiently.   Time 12   Period Weeks   Status Achieved   OT LONG TERM GOAL #3   Title Patient will be able to donn shirt and jacket on.   Time 12   Period Weeks   Status Partially Met   OT LONG TERM GOAL #4   Title Patient will be able to pull up pants.   Time 12   Period Weeks   Status Achieved   OT LONG TERM GOAL #5   Title Will be able to donn socks   Time 12   Period Weeks   Status Achieved   OT LONG TERM GOAL #6   Title Patient will be able to raise at least one arm past 90o of flexion  to aid in ADL   Time 12   Period Weeks   Status Partially Met               Plan - 11/04/15 1447    Pt will benefit from skilled therapeutic intervention in order to improve on the following deficits (Retired) Decreased activity tolerance;Decreased coordination;Decreased endurance;Decreased range of motion;Impaired flexibility;Decreased strength   OT Treatment/Interventions Self-care/ADL training;Moist Heat;DME and/or AE instruction;Energy conservation;Neuromuscular education;Therapeutic exercise;Manual Therapy;Patient/family education;Therapeutic activities;Therapeutic exercises        Problem List Patient Active Problem List   Diagnosis Date Noted  . Cerebral thrombosis with cerebral infarction 10/12/2015  . Central cord syndrome ([facility]) 10/11/2015  . Carotid stenosis 06/25/2013  . Preop cardiovascular exam 06/25/2013  . CAD (coronary artery disease) 08/05/2011  . Hyperlipidemia 08/05/2011  . HTN (hypertension) 08/05/2011  . Chest discomfort 08/05/2011    M , MS/OTR/L  ,  M 02/05/2016, 2:32 PM  [place] [facility] MAIN REHAB SERVICES [address] , , [address] Phone: [phone]   Fax:  [phone]    Name: Jake Garcia MRN: [id number] Date of Birth: 05-25-1929

## 2015-11-09 ENCOUNTER — Ambulatory Visit (INDEPENDENT_AMBULATORY_CARE_PROVIDER_SITE_OTHER): Payer: Medicare Other | Admitting: Cardiovascular Disease

## 2015-11-09 ENCOUNTER — Ambulatory Visit: Payer: Medicare Other | Admitting: Occupational Therapy

## 2015-11-09 ENCOUNTER — Encounter: Payer: Self-pay | Admitting: Cardiovascular Disease

## 2015-11-09 ENCOUNTER — Encounter: Payer: Self-pay | Admitting: Occupational Therapy

## 2015-11-09 VITALS — BP 120/70 | HR 77 | Ht 68.0 in | Wt 154.8 lb

## 2015-11-09 DIAGNOSIS — R29898 Other symptoms and signs involving the musculoskeletal system: Secondary | ICD-10-CM | POA: Diagnosis not present

## 2015-11-09 DIAGNOSIS — E785 Hyperlipidemia, unspecified: Secondary | ICD-10-CM

## 2015-11-09 DIAGNOSIS — I10 Essential (primary) hypertension: Secondary | ICD-10-CM

## 2015-11-09 DIAGNOSIS — I6521 Occlusion and stenosis of right carotid artery: Secondary | ICD-10-CM

## 2015-11-09 DIAGNOSIS — R0789 Other chest pain: Secondary | ICD-10-CM

## 2015-11-09 DIAGNOSIS — I639 Cerebral infarction, unspecified: Secondary | ICD-10-CM

## 2015-11-09 DIAGNOSIS — I25119 Atherosclerotic heart disease of native coronary artery with unspecified angina pectoris: Secondary | ICD-10-CM

## 2015-11-09 DIAGNOSIS — S14129D Central cord syndrome at unspecified level of cervical spinal cord, subsequent encounter: Secondary | ICD-10-CM

## 2015-11-09 MED ORDER — ROSUVASTATIN CALCIUM 10 MG PO TABS: 10.0000 mg | ORAL_TABLET | Freq: Every day | ORAL | Status: DC

## 2015-11-09 MED ORDER — AMLODIPINE BESYLATE 10 MG PO TABS: 10.0000 mg | ORAL_TABLET | Freq: Every day | ORAL | Status: DC

## 2015-11-09 NOTE — Progress Notes (Signed)
Patient ID: Jake Garcia, male    DOB: 03-24-1929, 81 y.o.   MRN: [id number]  HPI Comments: Jake Garcia is an 81 -year-old  male with a history of coronary artery disease, peripheral vascular disease with 60% bilateral carotid stenosis in 2009,  bypass surgery in December 2009 at [facility], high-grade complex stenosis of the LAD with a LIMA to the LAD which was off-pump, hyperlipidemia, hypertension,  episode of chest pain August 2012 with cardiac catheter and stent placement.  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 Significant workup at the time showed carotid disease unchanged from prior studies,  echocardiogram with normal ejection fraction Was seen by neurosurgery and recommended to participate in physical therapy Was discharged on a statin but did not fill this prescription yet Reports having low heart rate at home Has not been taking his metoprolol, has been taking amlodipine 5 mg twice a day  He declined EKG on today's visit  Other past medical history Refused cholesterol medication in the past, also refused aspirin  At the time of his cardiac catheterization, He had a 99% mid RCA lesion with stent placed also noted to have 25% left main, LIMA to the LAD was patent with minimal LAD disease, large ramus with 30% proximal disease, small to moderate OM 2 with 40% mid vessel disease, no LV gram performed. Stent was a Xience 3.5 x 23 mm DES stent.   echocardiogram in December 2009 showed mild LVH, diastolic dysfunction, mild TR, mildly elevated right ventricular systolic pressures.  No Known Allergies  Current Outpatient Prescriptions on File Prior to Visit  Medication Sig Dispense Refill  . aspirin 325 MG tablet Take 1 tablet (325 mg total) by mouth daily. 30 tablet 0   No current  facility-administered medications on file prior to visit.    Past Medical History  Diagnosis Date  . Coronary artery disease   . Hyperlipidemia   . Hypertension     Past Surgical History  Procedure Laterality Date  . Coronary artery bypass graft  11/24/2008  . Cardiac catheterization  11/20/2008    Social History  reports that he has never smoked. He does not have any smokeless tobacco history on file. He reports that he does not drink alcohol or use illicit drugs.  Family History Family history is unknown by patient.   Review of Systems  Constitutional: Negative.   Respiratory: Negative.   Cardiovascular: Negative.   Gastrointestinal: Negative.   Musculoskeletal: Positive for back pain and arthralgias.       Restricted motion of his arms bilaterally, unable to raise above his shoulder height  Neurological: Negative.   Hematological: Negative.   Psychiatric/Behavioral: Negative.   All other systems reviewed and are negative.   BP 120/70 mmHg  Pulse 77  Ht 5\' 8"  (1.727 m)  Wt 154 lb 12 oz (70.194 kg)  BMI 23.54 kg/m2  Physical Exam  Constitutional: He is oriented to person, place, and time. He appears well-developed and well-nourished.  HENT:  Head: Normocephalic.  Nose: Nose normal.  Mouth/Throat: Oropharynx is clear and moist.  Eyes: Conjunctivae are normal. Pupils are equal, round, and reactive to light.  Neck: Normal range of motion. Neck supple. No JVD present.  Cardiovascular: Normal rate, regular rhythm, S1 normal, S2 normal, normal heart sounds and intact distal pulses.  Exam  reveals no gallop and no friction rub.   No murmur heard. Pulmonary/Chest: Effort normal and breath sounds normal. No respiratory distress. He has no wheezes. He has no rales. He exhibits no tenderness.  Abdominal: Soft. Bowel sounds are normal. He exhibits no distension. There is no tenderness.  Musculoskeletal: Normal range of motion. He exhibits no edema or tenderness.  Restricted  motion of his arms, unable to extend above his shoulder height  Lymphadenopathy:    He has no cervical adenopathy.  Neurological: He is alert and oriented to person, place, and time. Coordination normal.  Skin: Skin is warm and dry. No rash noted. No erythema.  Psychiatric: He has a normal mood and affect. His behavior is normal. Judgment and thought content normal.      Assessment and Plan   Nursing note and vitals reviewed.

## 2015-11-09 NOTE — Assessment & Plan Note (Signed)
Recent carotid ultrasound showing stable moderate plaque  We'll start Crestor, stay on aspirin

## 2015-11-09 NOTE — Assessment & Plan Note (Signed)
He did not fill his Lipitor. In the past did not want a statin. After long discussion with him and his wife, he will start Crestor 10 mg daily. Recommended he call us for any side effects Goal LDL less than 70

## 2015-11-09 NOTE — Assessment & Plan Note (Signed)
Currently with no symptoms of angina. No further workup at this time.  Recommended he start Crestor 10 mg daily, continue on his aspirin

## 2015-11-09 NOTE — Assessment & Plan Note (Signed)
Hospital records reviewed with him. He is unclear what happened Imaging studies reviewed with him in detail He has not had follow-up with neurosurgery He is participating in physical therapy and feels that he is slowly improving

## 2015-11-09 NOTE — Assessment & Plan Note (Signed)
He denies any chest pain concerning for angina on today's visit  No further testing at this time

## 2015-11-09 NOTE — Assessment & Plan Note (Signed)
He will continue to hold his metoprolol  Encouraged him to stay on amlodipine 5 mg twice a day as he is currently taking.  He did have dizziness on 10 mg daily

## 2015-11-09 NOTE — Patient Instructions (Signed)
You are doing well.  Please start crestor one pill a day  Please call us if you have new issues that need to be addressed before your next appt.  Your physician wants you to follow-up in: 6 months.  You will receive a reminder letter in the mail two months in advance. If you don't receive a letter, please call our office to schedule the follow-up appointment.

## 2015-11-10 NOTE — Therapy (Signed)
[place] MAIN [facility] SERVICES [address] [place], [place], [address] Phone: [phone]   Fax:  [phone]  Occupational Therapy Treatment  Patient Details  Name: Jake Garcia MRN: [id number] Date of Birth: August 10, 1929 No Data Recorded  Encounter Date: 11/09/2015      OT End of Session - 11/09/15 1657    Visit Number 6   Number of Visits 24   Date for OT Re-Evaluation 01/12/16   OT Start Time 1420   OT Stop Time 1501   OT Time Calculation (min) 41 min   Activity Tolerance Patient tolerated treatment well   Behavior During Therapy [facility] for tasks assessed/performed      Past Medical History  Diagnosis Date  . Coronary artery disease   . Hyperlipidemia   . Hypertension     Past Surgical History  Procedure Laterality Date  . Coronary artery bypass graft  11/24/2008  . Cardiac catheterization  11/20/2008    There were no vitals filed for this visit.  Visit Diagnosis:  Weakness of both arms      Subjective Assessment - 11/09/15 1656    Subjective  "I just can't understand why I can't get my arms to raise above my head!"   Limitations B UE weakness.   Patient Stated Goals Patient reports he wants to be able to be independent and do everything he did before.  "Get my arms moving and working"   Currently in Pain? No/denies   Pain Score 0-No pain                      OT Treatments/Exercises (OP) - 11/10/15 1918    Neurological Re-education Exercises   Other Exercises 1 Patient seen for mat exercises in supine with dowel for shoulder flexion, chest press, ABD/ADD, forwards circles, backwards circles for 10 reps for 2 sets, cues from therapist and guiding from therapist.   Wall slides in standing with cues and guiding techniques.  Wall push ups with cues for positioning of arms, 10 reps .  Medium therapy ball on table with patient standing and performing forward reaching over the ball rolls.  Grip strengthening tasks  bilateral UEs for 25 reps each 11#.                OT Education - 11/09/15 1657    Education provided Yes   Education Details exercises for BUE for strengthening and ROM   Person(s) Educated Patient   Methods Explanation;Demonstration;Verbal cues   Comprehension Verbal cues required;Returned demonstration;Verbalized understanding             OT Long Term Goals - 10/20/15 1502    OT LONG TERM GOAL #1   Title Patient will improve B UE strength and range to be able to comb hair.   Baseline Unable   Time 12   Period Weeks   Status New   OT LONG TERM GOAL #2   Title Patient will be abe to self shave efficiently.   Baseline Unable   Time 12   Period Weeks   Status New   OT LONG TERM GOAL #3   Title Patient will be able to donn shirt and jacket on.   Baseline needs assist   Time 12   Period Weeks   Status New   OT LONG TERM GOAL #4   Title Patient will be able to pull up pants.   Baseline unable   Time 12   Period Weeks  Status New   OT LONG TERM GOAL #5   Title Will be able to donn socks   Baseline unable to donn socks   Time 12   Period Weeks   Status New               Plan - 11/09/15 1658    Clinical Impression Statement Patient continues to progress with strength and ROM but still has difficulty with shoulder flexion in supine and benefits from therapist guiding when patient reaching about 90 degrees of shoulder flexion.  He is unable to actively reach over head in sitting or standing without guiding or assist.  Continue to focus on strengthening tasks to improve performance in daily tasks.   Pt will benefit from skilled therapeutic intervention in order to improve on the following deficits (Retired) Decreased activity tolerance;Decreased coordination;Decreased endurance;Decreased range of motion;Impaired flexibility;Decreased strength   OT Frequency 2x / week   OT Duration 12 weeks   OT Treatment/Interventions Self-care/ADL training;Moist  Heat;DME and/or AE instruction;Energy conservation;Neuromuscular education;Therapeutic exercise;Manual Therapy;Patient/family education;Therapeutic activities;Therapeutic exercises   Consulted and Agree with Plan of Care Patient        Problem List Patient Active Problem List   Diagnosis Date Noted  . Cerebral thrombosis with cerebral infarction 10/12/2015  . Central cord syndrome ([place]) 10/11/2015  . Carotid stenosis 06/25/2013  . Preop cardiovascular exam 06/25/2013  . CAD (coronary artery disease) 08/05/2011  . Hyperlipidemia 08/05/2011  . HTN (hypertension) 08/05/2011  . Chest discomfort 08/05/2011  Danyale Ridinger T Tomasita Morrow, OTR/L, CLT  Dorn Hartshorne 11/10/2015, 7:24 PM  [place] MAIN [facility] SERVICES [address] [place], [place], [address] Phone: [phone]   Fax:  [phone]  Name: Jake Garcia MRN: [id number] Date of Birth: Dec 26, 1928

## 2015-11-11 ENCOUNTER — Ambulatory Visit: Payer: Medicare Other

## 2015-11-11 ENCOUNTER — Ambulatory Visit: Payer: Medicare Other | Admitting: Occupational Therapy

## 2015-11-11 ENCOUNTER — Encounter: Payer: Self-pay | Admitting: Occupational Therapy

## 2015-11-11 DIAGNOSIS — R29898 Other symptoms and signs involving the musculoskeletal system: Secondary | ICD-10-CM

## 2015-11-11 NOTE — Therapy (Signed)
[place] MAIN [facility] SERVICES [address] [place], [place], [address] Phone: [phone]   Fax:  [phone]  Occupational Therapy Treatment  Patient Details  Name: Jake Garcia MRN: [id number] Date of Birth: 05/23/1929 No Data Recorded  Encounter Date: 11/11/2015      OT End of Session - 11/11/15 1502    Visit Number 7   Number of Visits 24   Date for OT Re-Evaluation 01/12/16   OT Start Time 1415   OT Stop Time 1500   OT Time Calculation (min) 45 min   Behavior During Therapy [facility] for tasks assessed/performed      Past Medical History  Diagnosis Date  . Coronary artery disease   . Hyperlipidemia   . Hypertension     Past Surgical History  Procedure Laterality Date  . Coronary artery bypass graft  11/24/2008  . Cardiac catheterization  11/20/2008    There were no vitals filed for this visit.  Visit Diagnosis:  Weakness of both arms      Subjective Assessment - 11/11/15 1500    Subjective  I feel like it is a little better.   Pain Score 0-No pain                      OT Treatments/Exercises (OP) - 11/10/15 1918    Neurological Re-education Exercises   Other Exercises 1 Patient seen for mat exercises in supine with dowel for shoulder flexion, chest press, ABD/ADD, forwards circles, backwards circles for 10 reps for 2 sets, cues from therapist and guiding from therapist.   Wall slides in standing with cues and guiding techniques.  Wall push ups with cues for positioning of arms, 10 reps .  Medium therapy ball on table with patient standing and performing forward reaching over the ball rolls.  Grip strengthening tasks bilateral UEs for 25 reps each 11#.    Supine B upper extremity  exercises shoulder flexion, external and internal rotation elbow flexion and extension, forearm supination and pronation, wrist flexion and extension, and hand flexion and extension 10 repetitions. Minimal assist at 90o of flexion. In  sitting passive shoulder flexion then eccentric flexion x 10. ube backwards using shoulder flexion 1 min hard 1 min res, 1 min hard. Nerve glides both arms x 10, chin tucks x 10 cervical distraction and scapular mobilization.                  OT Long Term Goals - 10/20/15 1502    OT LONG TERM GOAL #1   Title Patient will improve B UE strength and range to be able to comb hair.   Baseline Unable   Time 12   Period Weeks   Status New   OT LONG TERM GOAL #2   Title Patient will be abe to self shave efficiently.   Baseline Unable   Time 12   Period Weeks   Status New   OT LONG TERM GOAL #3   Title Patient will be able to donn shirt and jacket on.   Baseline needs assist   Time 12   Period Weeks   Status New   OT LONG TERM GOAL #4   Title Patient will be able to pull up pants.   Baseline unable   Time 12   Period Weeks   Status New   OT LONG TERM GOAL #5   Title Will be able to donn socks   Baseline unable to donn socks  Time 12   Period Weeks   Status New               Plan - 11/11/15 1502    Clinical Impression Statement Patient continues to progress by his own admition.    Pt will benefit from skilled therapeutic intervention in order to improve on the following deficits (Retired) Decreased activity tolerance;Decreased coordination;Decreased endurance;Decreased range of motion;Impaired flexibility;Decreased strength   OT Treatment/Interventions Self-care/ADL training;Moist Heat;DME and/or AE instruction;Energy conservation;Neuromuscular education;Therapeutic exercise;Manual Therapy;Patient/family education;Therapeutic activities;Therapeutic exercises   Consulted and Agree with Plan of Care Patient        Problem List Patient Active Problem List   Diagnosis Date Noted  . Cerebral thrombosis with cerebral infarction 10/12/2015  . Central cord syndrome ([place]) 10/11/2015  . Carotid stenosis 06/25/2013  . Preop cardiovascular exam 06/25/2013  . CAD  (coronary artery disease) 08/05/2011  . Hyperlipidemia 08/05/2011  . HTN (hypertension) 08/05/2011  . Chest discomfort 08/05/2011   Sharon Mt, MS/OTR/L  Sharon Mt 11/11/2015, 3:07 PM  [place] MAIN [facility] SERVICES [address] [place], [place], [address] Phone: [phone]   Fax:  [phone]  Name: Jake Garcia MRN: [id number] Date of Birth: 24-Jun-1929

## 2015-11-16 ENCOUNTER — Ambulatory Visit: Payer: Medicare Other | Admitting: Occupational Therapy

## 2015-11-16 ENCOUNTER — Encounter: Payer: Self-pay | Admitting: Occupational Therapy

## 2015-11-16 DIAGNOSIS — R29898 Other symptoms and signs involving the musculoskeletal system: Secondary | ICD-10-CM

## 2015-11-16 NOTE — Patient Instructions (Signed)
Continue home program

## 2015-11-16 NOTE — Therapy (Signed)
[place] MAIN [facility] SERVICES [address] [place], [place], [address] Phone: [phone]   Fax:  [phone]  Occupational Therapy Treatment  Patient Details  Name: Jake Garcia MRN: [id number] Date of Birth: 1929-08-21 No Data Recorded  Encounter Date: 11/16/2015      OT End of Session - 11/16/15 1500    Visit Number 8   Number of Visits 24   Date for OT Re-Evaluation 01/12/16   OT Start Time 1415   OT Stop Time 1458   OT Time Calculation (min) 43 min   Behavior During Therapy [facility] for tasks assessed/performed      Past Medical History  Diagnosis Date  . Coronary artery disease   . Hyperlipidemia   . Hypertension     Past Surgical History  Procedure Laterality Date  . Coronary artery bypass graft  11/24/2008  . Cardiac catheterization  11/20/2008    There were no vitals filed for this visit.  Visit Diagnosis:  Weakness of both arms      Subjective Assessment - 11/16/15 1459    Subjective  I feel good after your sessions.   Pain Score 0-No pain    Chin tucks and nerve glides completed through out session. Supine  Bilateral shoulder flexion, circles completed at 90o, boxing movements, scapular mobilization and shoulder distractions through out the session.. Patient reported L shoulder "clicking" stopped after distractions.  Bar exercises for shoulder flexion. Cues needed for technique and proper placement.                               OT Long Term Goals - 10/20/15 1502    OT LONG TERM GOAL #1   Title Patient will improve B UE strength and range to be able to comb hair.   Baseline Unable   Time 12   Period Weeks   Status New   OT LONG TERM GOAL #2   Title Patient will be abe to self shave efficiently.   Baseline Unable   Time 12   Period Weeks   Status New   OT LONG TERM GOAL #3   Title Patient will be able to donn shirt and jacket on.   Baseline needs assist   Time 12   Period Weeks    Status New   OT LONG TERM GOAL #4   Title Patient will be able to pull up pants.   Baseline unable   Time 12   Period Weeks   Status New   OT LONG TERM GOAL #5   Title Will be able to donn socks   Baseline unable to donn socks   Time 12   Period Weeks   Status New               Plan - 11/16/15 1501    Clinical Impression Statement Able to flex shoulders fully in supine.   Pt will benefit from skilled therapeutic intervention in order to improve on the following deficits (Retired) Decreased activity tolerance;Decreased coordination;Decreased endurance;Decreased range of motion;Impaired flexibility;Decreased strength   OT Treatment/Interventions Self-care/ADL training;Moist Heat;DME and/or AE instruction;Energy conservation;Neuromuscular education;Therapeutic exercise;Manual Therapy;Patient/family education;Therapeutic activities;Therapeutic exercises        Problem List Patient Active Problem List   Diagnosis Date Noted  . Cerebral thrombosis with cerebral infarction 10/12/2015  . Central cord syndrome ([place]) 10/11/2015  . Carotid stenosis 06/25/2013  . Preop cardiovascular exam 06/25/2013  . CAD (coronary artery  disease) 08/05/2011  . Hyperlipidemia 08/05/2011  . HTN (hypertension) 08/05/2011  . Chest discomfort 08/05/2011   Sharon Mt, MS/OTR/L  Sharon Mt 11/16/2015, 3:05 PM  [place] MAIN [facility] SERVICES [address] [place], [place], [address] Phone: [phone]   Fax:  [phone]  Name: Jake Garcia MRN: [id number] Date of Birth: [birth year]

## 2015-11-18 ENCOUNTER — Ambulatory Visit: Payer: Medicare Other | Admitting: Occupational Therapy

## 2015-11-18 ENCOUNTER — Encounter: Payer: Self-pay | Admitting: Occupational Therapy

## 2015-11-18 DIAGNOSIS — R29898 Other symptoms and signs involving the musculoskeletal system: Secondary | ICD-10-CM | POA: Diagnosis not present

## 2015-11-18 NOTE — Therapy (Signed)
[place] MAIN [facility] SERVICES [address] [place], [place], [address] Phone: [phone]   Fax:  [phone]  Occupational Therapy Treatment  Patient Details  Name: RITHY DORWARD MRN: [id number] Date of Birth: 06/20/1929 No Data Recorded  Encounter Date: 11/18/2015      OT End of Session - 11/18/15 1450    Visit Number 9   Number of Visits 24   Date for OT Re-Evaluation 01/12/16   OT Start Time 1420   OT Stop Time 1500   OT Time Calculation (min) 40 min   Behavior During Therapy [facility] for tasks assessed/performed      Past Medical History  Diagnosis Date  . Coronary artery disease   . Hyperlipidemia   . Hypertension     Past Surgical History  Procedure Laterality Date  . Coronary artery bypass graft  11/24/2008  . Cardiac catheterization  11/20/2008    There were no vitals filed for this visit.  Visit Diagnosis:  Weakness of both arms      Subjective Assessment - 11/18/15 1449    Subjective  Darn it why can't I raise my arms   Pain Score 0-No pain    Supine, active shoulder flexion, boxing, place and hole at 90o, then circles, in sitting, eccentric shoulder flexion against gravity (therapist raises arms and patient lower in a controled manner. UBE backwards 1 min on 1 min off x 3. Yellow theraband used for external rotation x 20. issued for home use.  Mobilization of scapula, cervical distraction.                               OT Long Term Goals - 10/20/15 1502    OT LONG TERM GOAL #1   Title Patient will improve B UE strength and range to be able to comb hair.   Baseline Unable   Time 12   Period Weeks   Status New   OT LONG TERM GOAL #2   Title Patient will be abe to self shave efficiently.   Baseline Unable   Time 12   Period Weeks   Status New   OT LONG TERM GOAL #3   Title Patient will be able to donn shirt and jacket on.   Baseline needs assist   Time 12   Period Weeks   Status  New   OT LONG TERM GOAL #4   Title Patient will be able to pull up pants.   Baseline unable   Time 12   Period Weeks   Status New   OT LONG TERM GOAL #5   Title Will be able to donn socks   Baseline unable to donn socks   Time 12   Period Weeks   Status New               Plan - 11/18/15 1450    Pt will benefit from skilled therapeutic intervention in order to improve on the following deficits (Retired) Decreased activity tolerance;Decreased coordination;Decreased endurance;Decreased range of motion;Impaired flexibility;Decreased strength   OT Treatment/Interventions Self-care/ADL training;Moist Heat;DME and/or AE instruction;Energy conservation;Neuromuscular education;Therapeutic exercise;Manual Therapy;Patient/family education;Therapeutic activities;Therapeutic exercises        Problem List Patient Active Problem List   Diagnosis Date Noted  . Cerebral thrombosis with cerebral infarction 10/12/2015  . Central cord syndrome ([place]) 10/11/2015  . Carotid stenosis 06/25/2013  . Preop cardiovascular exam 06/25/2013  . CAD (coronary artery disease) 08/05/2011  .  Hyperlipidemia 08/05/2011  . HTN (hypertension) 08/05/2011  . Chest discomfort 08/05/2011   Sharon Mt, MS/OTR/L  Sharon Mt 11/18/2015, 3:05 PM  [place] MAIN [facility] SERVICES [address] [place], [place], [address] Phone: [phone]   Fax:  [phone]  Name: JAQUAI OFFERMANN MRN: [id number] Date of Birth: [birth year]

## 2015-11-23 ENCOUNTER — Encounter: Payer: Self-pay | Admitting: Occupational Therapy

## 2015-11-23 ENCOUNTER — Ambulatory Visit: Payer: Medicare Other | Admitting: Occupational Therapy

## 2015-11-23 DIAGNOSIS — R29898 Other symptoms and signs involving the musculoskeletal system: Secondary | ICD-10-CM

## 2015-11-23 NOTE — Therapy (Signed)
[place] MAIN [facility] SERVICES [address] [place], [place], [address] Phone: [phone]   Fax:  [phone]  Occupational Therapy Treatment/Progress Note Patient Details  Name: Jake Garcia MRN: [id number] Date of Birth: [birth year] No Data Recorded  Encounter Date: 11/23/2015      OT End of Session - 11/23/15 1428    Visit Number 10   Number of Visits 24   Date for OT Re-Evaluation 01/12/16   OT Start Time 1404   OT Stop Time 1444   OT Time Calculation (min) 40 min   Behavior During Therapy [facility] for tasks assessed/performed      Past Medical History  Diagnosis Date  . Coronary artery disease   . Hyperlipidemia   . Hypertension     Past Surgical History  Procedure Laterality Date  . Coronary artery bypass graft  11/24/2008  . Cardiac catheterization  11/20/2008    There were no vitals filed for this visit.  Visit Diagnosis:  Weakness of both arms      Subjective Assessment - 11/23/15 1426    Subjective  I can do a lot more for myself now.   Pain Score 0-No pain    In supine, active bilateral shoulder flexion, circles at 90o, boxing motions, in sitting eccentric shoulder flexion, ube backwards 3 minutes with adjustment to lower setting each minute, Mobilization of neck and scapula, nerve glides. Cues for technique and positioning. Patient has achieved 3 of 5 functional goals, and progressed in 2 more and one more added (goals listed)                               OT Long Term Goals - 11/23/15 1457    OT LONG TERM GOAL #1   Title Patient will improve B UE strength and range to be able to comb hair.   Baseline Unable   Time 12   Period Weeks   Status Partially Met   OT LONG TERM GOAL #2   Title Patient will be abe to self shave efficiently.   Baseline Unable   Time 12   Period Weeks   Status Achieved   OT LONG TERM GOAL #3   Title Patient will be able to donn shirt and jacket on.   Baseline  needs assist   Time 12   Period Weeks   Status Partially Met   OT LONG TERM GOAL #4   Title Patient will be able to pull up pants.   Baseline unable   Time 12   Period Weeks   Status Achieved   OT LONG TERM GOAL #5   Title Will be able to donn socks   Baseline unable to donn socks   Time 12   Period Weeks   Status Achieved   OT LONG TERM GOAL #6   Title Patient will be able to raise at least one arm past 90o of flexion to aid in ADL   Time 12   Period Weeks   Status New               Plan - 11/23/15 1445    Clinical Impression Statement Continues to improve with self care.   Pt will benefit from skilled therapeutic intervention in order to improve on the following deficits (Retired) Decreased activity tolerance;Decreased coordination;Decreased endurance;Decreased range of motion;Impaired flexibility;Decreased strength   OT Treatment/Interventions Self-care/ADL training;Moist Heat;DME and/or AE instruction;Energy conservation;Neuromuscular education;Therapeutic exercise;Manual Therapy;Patient/family  education;Therapeutic activities;Therapeutic exercises          G-Codes - 12/16/2015 1422    Functional Assessment Tool Used clinical judgement   Functional Limitation Self care   Self Care Current Status [phone]) At least 40 percent but less than 60 percent impaired, limited or restricted   Self Care Goal Status (O1224) At least 1 percent but less than 20 percent impaired, limited or restricted      Problem List Patient Active Problem List   Diagnosis Date Noted  . Cerebral thrombosis with cerebral infarction 10/12/2015  . Central cord syndrome ([place]) 10/11/2015  . Carotid stenosis 06/25/2013  . Preop cardiovascular exam 06/25/2013  . CAD (coronary artery disease) 08/05/2011  . Hyperlipidemia 08/05/2011  . HTN (hypertension) 08/05/2011  . Chest discomfort 08/05/2011   Sharon Mt, MS/OTR/L  Sharon Mt 12-16-2015, 2:58 PM  [place] MAIN [facility] SERVICES [address] [place], [place], [address] Phone: [phone]   Fax:  [phone]  Name: CATALDO COSGRIFF MRN: [id number] Date of Birth: 1929-10-22

## 2015-11-25 ENCOUNTER — Encounter: Payer: Self-pay | Admitting: Occupational Therapy

## 2015-11-25 ENCOUNTER — Ambulatory Visit: Payer: Medicare Other | Admitting: Occupational Therapy

## 2015-11-25 DIAGNOSIS — R29898 Other symptoms and signs involving the musculoskeletal system: Secondary | ICD-10-CM

## 2015-11-25 NOTE — Therapy (Signed)
[place] MAIN [facility] SERVICES [address] [place], [place], [address] Phone: [phone]   Fax:  [phone]  Occupational Therapy Treatment  Patient Details  Name: Jake Garcia MRN: [id number] Date of Birth: July 14, 1929 No Data Recorded  Encounter Date: 11/25/2015      OT End of Session - 11/25/15 1508    Visit Number 11   Number of Visits 24   Date for OT Re-Evaluation 01/12/16   OT Start Time 1430   OT Stop Time 1515   OT Time Calculation (min) 45 min   Activity Tolerance Patient tolerated treatment well   Behavior During Therapy [facility] for tasks assessed/performed      Past Medical History  Diagnosis Date  . Coronary artery disease   . Hyperlipidemia   . Hypertension     Past Surgical History  Procedure Laterality Date  . Coronary artery bypass graft  11/24/2008  . Cardiac catheterization  11/20/2008    There were no vitals filed for this visit.  Visit Diagnosis:  Weakness of both arms      Subjective Assessment - 11/25/15 1505    Subjective  I think i am on the mend   Pain Score 0-No pain    UBE 1 min on 1 min off x 3. In supine, shoulder flexion/extension full range (active). At 90o of shoulder flexion completed alphabet with each arm separately.  Nerve glides and chin tucks completed.                                 OT Long Term Goals - 11/23/15 1457    OT LONG TERM GOAL #1   Title Patient will improve B UE strength and range to be able to comb hair.   Baseline Unable   Time 12   Period Weeks   Status Partially Met   OT LONG TERM GOAL #2   Title Patient will be abe to self shave efficiently.   Baseline Unable   Time 12   Period Weeks   Status Achieved   OT LONG TERM GOAL #3   Title Patient will be able to donn shirt and jacket on.   Baseline needs assist   Time 12   Period Weeks   Status Partially Met   OT LONG TERM GOAL #4   Title Patient will be able to pull up pants.   Baseline unable   Time 12   Period Weeks   Status Achieved   OT LONG TERM GOAL #5   Title Will be able to donn socks   Baseline unable to donn socks   Time 12   Period Weeks   Status Achieved   OT LONG TERM GOAL #6   Title Patient will be able to raise at least one arm past 90o of flexion to aid in ADL   Time 12   Period Weeks   Status New               Plan - 11/25/15 1509    Clinical Impression Statement Patient improving by his own admition.   Pt will benefit from skilled therapeutic intervention in order to improve on the following deficits (Retired) Decreased activity tolerance;Decreased coordination;Decreased endurance;Decreased range of motion;Impaired flexibility;Decreased strength   OT Treatment/Interventions Self-care/ADL training;Moist Heat;DME and/or AE instruction;Energy conservation;Neuromuscular education;Therapeutic exercise;Manual Therapy;Patient/family education;Therapeutic activities;Therapeutic exercises        Problem List Patient Active Problem List  Diagnosis Date Noted  . Cerebral thrombosis with cerebral infarction 10/12/2015  . Central cord syndrome ([place]) 10/11/2015  . Carotid stenosis 06/25/2013  . Preop cardiovascular exam 06/25/2013  . CAD (coronary artery disease) 08/05/2011  . Hyperlipidemia 08/05/2011  . HTN (hypertension) 08/05/2011  . Chest discomfort 08/05/2011   Sharon Mt, MS/OTR/L  Sharon Mt 11/25/2015, 3:18 PM  [place] MAIN [facility] SERVICES [address] [place], [place], [address] Phone: [phone]   Fax:  [phone]  Name: Jake Garcia MRN: [id number] Date of Birth: 04-27-1929

## 2015-11-30 ENCOUNTER — Ambulatory Visit: Payer: Medicare Other | Admitting: Occupational Therapy

## 2015-11-30 DIAGNOSIS — R29898 Other symptoms and signs involving the musculoskeletal system: Secondary | ICD-10-CM | POA: Diagnosis not present

## 2015-11-30 NOTE — Therapy (Signed)
[place] MAIN [facility] SERVICES [address] [place], [place], [address] Phone: [phone]   Fax:  [phone]  Occupational Therapy Treatment  Patient Details  Name: Jake Garcia MRN: [id number] Date of Birth: Mar 31, 1929 No Data Recorded  Encounter Date: 11/30/2015      OT End of Session - 11/30/15 1526    Visit Number 12   Number of Visits 24   Date for OT Re-Evaluation 01/12/16   OT Start Time 1330   OT Stop Time 1415   OT Time Calculation (min) 45 min   Behavior During Therapy [facility] for tasks assessed/performed      Past Medical History  Diagnosis Date  . Coronary artery disease   . Hyperlipidemia   . Hypertension     Past Surgical History  Procedure Laterality Date  . Coronary artery bypass graft  11/24/2008  . Cardiac catheterization  11/20/2008    There were no vitals filed for this visit.  Visit Diagnosis:  Weakness of both arms      Subjective Assessment - 11/30/15 1520    Subjective  I can't reach my thumb to my little finger on my right hand.   Pain Score 0-No pain    Supine, range of motion, alphabet at 90o, circles at 90o, Nu step using arms only 4 min at resistance of 3.  (1 min break at 1 min). Stretching thumb and 5 th finger of R hand. pudue peg board and then removing with thumb and 5 finger only.  Cues needed for technique.                               OT Long Term Goals - 11/23/15 1457    OT LONG TERM GOAL #1   Title Patient will improve B UE strength and range to be able to comb hair.   Baseline Unable   Time 12   Period Weeks   Status Partially Met   OT LONG TERM GOAL #2   Title Patient will be abe to self shave efficiently.   Baseline Unable   Time 12   Period Weeks   Status Achieved   OT LONG TERM GOAL #3   Title Patient will be able to donn shirt and jacket on.   Baseline needs assist   Time 12   Period Weeks   Status Partially Met   OT LONG TERM GOAL #4   Title Patient will be able to pull up pants.   Baseline unable   Time 12   Period Weeks   Status Achieved   OT LONG TERM GOAL #5   Title Will be able to donn socks   Baseline unable to donn socks   Time 12   Period Weeks   Status Achieved   OT LONG TERM GOAL #6   Title Patient will be able to raise at least one arm past 90o of flexion to aid in ADL   Time 12   Period [place] - 11/30/15 1527    Clinical Impression Statement Patient improving but still can't flexion his shoulders very far against gravity. He has full range supine.   Pt will benefit from skilled therapeutic intervention in order to improve on the following deficits (Retired) Decreased activity tolerance;Decreased coordination;Decreased endurance;Decreased range of motion;Impaired flexibility;Decreased strength  Problem List Patient Active Problem List   Diagnosis Date Noted  . Cerebral thrombosis with cerebral infarction 10/12/2015  . Central cord syndrome (HCC) 10/11/2015  . Carotid stenosis 06/25/2013  . Preop cardiovascular exam 06/25/2013  . CAD (coronary artery disease) 08/05/2011  . Hyperlipidemia 08/05/2011  . HTN (hypertension) 08/05/2011  . Chest discomfort 08/05/2011    M , MS/OTR/L  ,  M 11/30/2015, 3:40 PM  [place] [facility] MAIN REHAB SERVICES [address] [place], [place], [address] Phone: [phone]   Fax:  [phone]  Name: Lamin J Cadenas MRN: [id number] Date of Birth: 04/09/1929   

## 2015-12-02 ENCOUNTER — Ambulatory Visit: Payer: Medicare Other | Admitting: Occupational Therapy

## 2015-12-02 DIAGNOSIS — R29898 Other symptoms and signs involving the musculoskeletal system: Secondary | ICD-10-CM | POA: Diagnosis not present

## 2015-12-02 NOTE — Therapy (Signed)
[place] MAIN [facility] SERVICES [address] [place], [place], [address] Phone: [phone]   Fax:  [phone]  Occupational Therapy Treatment  Patient Details  Name: Jake Garcia MRN: [id number] Date of Birth: Jul 09, 1929 No Data Recorded  Encounter Date: 12/02/2015      OT End of Session - 12/02/15 1504    Visit Number 13   Number of Visits 24   Date for OT Re-Evaluation 01/12/16   OT Start Time 1413   OT Stop Time 1453   OT Time Calculation (min) 40 min   Behavior During Therapy [facility] for tasks assessed/performed      Past Medical History  Diagnosis Date  . Coronary artery disease   . Hyperlipidemia   . Hypertension     Past Surgical History  Procedure Laterality Date  . Coronary artery bypass graft  11/24/2008  . Cardiac catheterization  11/20/2008    There were no vitals filed for this visit.  Visit Diagnosis:  Weakness of both arms      Subjective Assessment - 12/02/15 1503    Subjective  I can't raise these arms   Pain Score 0-No pain    Supine range of motion of both shoulders (full in supine). At 90o of flexion completed circles, alphabet, (needed 2 rests on the left doing alphabet) boxing. In sitting, bilateral eccentric shoulder flexion x 10, 2 sets. New step 3 sets of 2 min. Cervical  Distraction completed.                           OT Education - 12/02/15 1504    Education provided Yes   Education Details Once again explained why this is occering.   Person(s) Educated Patient   Methods Explanation   Comprehension Verbalized understanding             OT Long Term Goals - 11/23/15 1457    OT LONG TERM GOAL #1   Title Patient will improve B UE strength and range to be able to comb hair.   Baseline Unable   Time 12   Period Weeks   Status Partially Met   OT LONG TERM GOAL #2   Title Patient will be abe to self shave efficiently.   Baseline Unable   Time 12   Period Weeks   Status Achieved   OT LONG TERM GOAL #3   Title Patient will be able to donn shirt and jacket on.   Baseline needs assist   Time 12   Period Weeks   Status Partially Met   OT LONG TERM GOAL #4   Title Patient will be able to pull up pants.   Baseline unable   Time 12   Period Weeks   Status Achieved   OT LONG TERM GOAL #5   Title Will be able to donn socks   Baseline unable to donn socks   Time 12   Period Weeks   Status Achieved   OT LONG TERM GOAL #6   Title Patient will be able to raise at least one arm past 90o of flexion to aid in ADL   Time 12   Period [place] - 12/02/15 1506    Pt will benefit from skilled therapeutic intervention in order to improve on the following deficits (Retired) Decreased activity tolerance;Decreased coordination;Decreased endurance;Decreased range of  motion;Impaired flexibility;Decreased strength   OT Treatment/Interventions Self-care/ADL training;Moist Heat;DME and/or AE instruction;Energy conservation;Neuromuscular education;Therapeutic exercise;Manual Therapy;Patient/family education;Therapeutic activities;Therapeutic exercises        Problem List Patient Active Problem List   Diagnosis Date Noted  . Cerebral thrombosis with cerebral infarction 10/12/2015  . Central cord syndrome ([place]) 10/11/2015  . Carotid stenosis 06/25/2013  . Preop cardiovascular exam 06/25/2013  . CAD (coronary artery disease) 08/05/2011  . Hyperlipidemia 08/05/2011  . HTN (hypertension) 08/05/2011  . Chest discomfort 08/05/2011   Sharon Mt, MS/OTR/L  Sharon Mt 12/02/2015, 3:09 PM  [place] MAIN [facility] SERVICES [address] [place], [place], [address] Phone: [phone]   Fax:  [phone]  Name: Jake Garcia MRN: [id number] Date of Birth: 05-30-1929

## 2015-12-07 ENCOUNTER — Ambulatory Visit: Payer: Medicare Other | Admitting: Occupational Therapy

## 2015-12-07 ENCOUNTER — Encounter: Payer: Self-pay | Admitting: Occupational Therapy

## 2015-12-07 DIAGNOSIS — R29898 Other symptoms and signs involving the musculoskeletal system: Secondary | ICD-10-CM

## 2015-12-07 NOTE — Therapy (Signed)
[place] MAIN [facility] SERVICES [address] [place], [place], [address] Phone: [phone]   Fax:  [phone]  Occupational Therapy Treatment  Patient Details  Name: Jake Garcia MRN: [id number] Date of Birth: [birth year] No Data Recorded  Encounter Date: 12/07/2015      OT End of Session - 12/07/15 1722    OT Stop Time 1500   Behavior During Therapy [facility] for tasks assessed/performed      Past Medical History  Diagnosis Date  . Coronary artery disease   . Hyperlipidemia   . Hypertension     Past Surgical History  Procedure Laterality Date  . Coronary artery bypass graft  11/24/2008  . Cardiac catheterization  11/20/2008    There were no vitals filed for this visit.  Visit Diagnosis:  Weakness of both arms      Subjective Assessment - 12/07/15 1438    Subjective  Why can't I raise these arms.   Pain Score 0-No pain     Measurements taken today active R shoulder flexion to 50o, (up from 40o) elbow flexion 5/5 (up from 4/5), elbow flexion 4/5 (up from 3-/5), forearm 5/5 (up from 4/5), wrist flexion and extension 4+/5 (up from 3/5) L UE shoulder flexion 61o (up from 31o), elbow flexion stayed at 3/5, elbow extension 4-/5 (up from 3+/5), supination movement 59o (up from 45o), Grip right 20 lbs (up from 15 lbs, left grip 46 lbs (up from 44) . In supine shoulder flexion (active full) x 10 each arm, boxing motions, circles and alphabet motions both arms separately.  Nu step setting 4, 3 sets of 2 minutes.   Bilateral eccentric shoulder flexion x 10.            )                OT Education - 12/07/15 1438    Education provided Yes   Education Details explained again why these deficits are present.   Person(s) Educated Patient   Methods Explanation   Comprehension Verbalized understanding             OT Long Term Goals - 11/23/15 1457    OT LONG TERM GOAL #1   Title Patient will improve B UE strength and  range to be able to comb hair.   Baseline Unable   Time 12   Period Weeks   Status Partially Met   OT LONG TERM GOAL #2   Title Patient will be abe to self shave efficiently.   Baseline Unable   Time 12   Period Weeks   Status Achieved   OT LONG TERM GOAL #3   Title Patient will be able to donn shirt and jacket on.   Baseline needs assist   Time 12   Period Weeks   Status Partially Met   OT LONG TERM GOAL #4   Title Patient will be able to pull up pants.   Baseline unable   Time 12   Period Weeks   Status Achieved   OT LONG TERM GOAL #5   Title Will be able to donn socks   Baseline unable to donn socks   Time 12   Period Weeks   Status Achieved   OT LONG TERM GOAL #6   Title Patient will be able to raise at least one arm past 90o of flexion to aid in ADL   Time 12   Period Weeks   Status New  Plan - 12/07/15 1448    Clinical Impression Statement Patient has improved in his range of motion and strength (numbers listed) but still lacks shoulder flexion and abduction.   Pt will benefit from skilled therapeutic intervention in order to improve on the following deficits (Retired) Decreased activity tolerance;Decreased coordination;Decreased endurance;Decreased range of motion;Impaired flexibility;Decreased strength   OT Treatment/Interventions Self-care/ADL training;Moist Heat;DME and/or AE instruction;Energy conservation;Neuromuscular education;Therapeutic exercise;Manual Therapy;Patient/family education;Therapeutic activities;Therapeutic exercises        Problem List Patient Active Problem List   Diagnosis Date Noted  . Cerebral thrombosis with cerebral infarction 10/12/2015  . Central cord syndrome ([place]) 10/11/2015  . Carotid stenosis 06/25/2013  . Preop cardiovascular exam 06/25/2013  . CAD (coronary artery disease) 08/05/2011  . Hyperlipidemia 08/05/2011  . HTN (hypertension) 08/05/2011  . Chest discomfort 08/05/2011    Sharon Mt 12/07/2015, 5:24 PM  [place] MAIN [facility] SERVICES [address] [place], [place], [address] Phone: [phone]   Fax:  [phone]  Name: Jake Garcia MRN: [id number] Date of Birth: January 06, 1929

## 2015-12-09 ENCOUNTER — Ambulatory Visit: Payer: Medicare Other | Admitting: Occupational Therapy

## 2015-12-09 ENCOUNTER — Encounter: Payer: Self-pay | Admitting: Occupational Therapy

## 2015-12-09 DIAGNOSIS — R29898 Other symptoms and signs involving the musculoskeletal system: Secondary | ICD-10-CM | POA: Diagnosis not present

## 2015-12-09 NOTE — Patient Instructions (Signed)
Patient showed a good exercise he made up at home and instructed to continue

## 2015-12-09 NOTE — Therapy (Signed)
[place] MAIN [facility] SERVICES [address] [place], [place], [address] Phone: [phone]   Fax:  [phone]  Occupational Therapy Treatment  Patient Details  Name: Jake Garcia MRN: [id number] Date of Birth: 1929-01-03 No Data Recorded  Encounter Date: 12/09/2015      OT End of Session - 12/09/15 1429    Visit Number 15   Number of Visits 24   Date for OT Re-Evaluation 01/12/16   OT Start Time 1415   OT Stop Time 1457   OT Time Calculation (min) 42 min   Behavior During Therapy [facility] for tasks assessed/performed      Past Medical History  Diagnosis Date  . Coronary artery disease   . Hyperlipidemia   . Hypertension     Past Surgical History  Procedure Laterality Date  . Coronary artery bypass graft  11/24/2008  . Cardiac catheterization  11/20/2008    There were no vitals filed for this visit.  Visit Diagnosis:  Weakness of both arms      Subjective Assessment - 12/09/15 1426    Subjective  I feel the strenght comming.   Limitations B UE weakness.   Pain Score 0-No pain    In sitting, bilateral eccentric shoulder flexion x 10. In supine, boxing, alphabet and straight range of motion with both arms exercises. Nu step on setting 4, 3 sets of 2 minutes. Used Judy/instructor board to flip pegs both hands, one handed nut and bolt screwing with thumb only, Used grooved peg board placing pegs in order and removed alternating fingers both hands.  Cues needed for technique and proper positioning.                          OT Education - 12/09/15 1428    Education provided Yes   Education Details Continue to have to explain why weakness is happening.   Person(s) Educated Patient   Methods Explanation   Comprehension Verbalized understanding             OT Long Term Goals - 11/23/15 1457    OT LONG TERM GOAL #1   Title Patient will improve B UE strength and range to be able to comb hair.   Baseline  Unable   Time 12   Period Weeks   Status Partially Met   OT LONG TERM GOAL #2   Title Patient will be abe to self shave efficiently.   Baseline Unable   Time 12   Period Weeks   Status Achieved   OT LONG TERM GOAL #3   Title Patient will be able to donn shirt and jacket on.   Baseline needs assist   Time 12   Period Weeks   Status Partially Met   OT LONG TERM GOAL #4   Title Patient will be able to pull up pants.   Baseline unable   Time 12   Period Weeks   Status Achieved   OT LONG TERM GOAL #5   Title Will be able to donn socks   Baseline unable to donn socks   Time 12   Period Weeks   Status Achieved   OT LONG TERM GOAL #6   Title Patient will be able to raise at least one arm past 90o of flexion to aid in ADL   Time 12   Period Weeks   Status New  Plan - 12/09/15 1430    Clinical Impression Statement Patient now can do one range rep of range to and past 90o at a 30o angle demonstrating improved strength against some gravity.   Pt will benefit from skilled therapeutic intervention in order to improve on the following deficits (Retired) Decreased activity tolerance;Decreased coordination;Decreased endurance;Decreased range of motion;Impaired flexibility;Decreased strength   OT Treatment/Interventions Self-care/ADL training;Moist Heat;DME and/or AE instruction;Energy conservation;Neuromuscular education;Therapeutic exercise;Manual Therapy;Patient/family education;Therapeutic activities;Therapeutic exercises        Problem List Patient Active Problem List   Diagnosis Date Noted  . Cerebral thrombosis with cerebral infarction 10/12/2015  . Central cord syndrome ([place]) 10/11/2015  . Carotid stenosis 06/25/2013  . Preop cardiovascular exam 06/25/2013  . CAD (coronary artery disease) 08/05/2011  . Hyperlipidemia 08/05/2011  . HTN (hypertension) 08/05/2011  . Chest discomfort 08/05/2011   Sharon Mt, MS/OTR/L  Sharon Mt 12/09/2015, 2:51  PM  [place] MAIN [facility] SERVICES [address] [place], [place], [address] Phone: [phone]   Fax:  [phone]  Name: Jake Garcia MRN: [id number] Date of Birth: 1929-01-18

## 2015-12-16 ENCOUNTER — Ambulatory Visit: Payer: Medicare Other | Admitting: Occupational Therapy

## 2015-12-16 ENCOUNTER — Encounter: Payer: Self-pay | Admitting: Occupational Therapy

## 2015-12-16 DIAGNOSIS — R29898 Other symptoms and signs involving the musculoskeletal system: Secondary | ICD-10-CM | POA: Diagnosis not present

## 2015-12-16 NOTE — Patient Instructions (Signed)
Instructed to keep working at home as he is now.

## 2015-12-16 NOTE — Therapy (Signed)
[place] MAIN [facility] SERVICES [address] [place], [place], [address] Phone: [phone]   Fax:  [phone]  Occupational Therapy Treatment  Patient Details  Name: Jake Garcia MRN: [id number] Date of Birth: 1929-08-28 No Data Recorded  Encounter Date: 12/16/2015      OT End of Session - 12/16/15 1501    Visit Number 16   Number of Visits 24   Date for OT Re-Evaluation 01/12/16   OT Start Time 1415   OT Stop Time 1500   OT Time Calculation (min) 45 min   Behavior During Therapy [facility] for tasks assessed/performed      Past Medical History  Diagnosis Date  . Coronary artery disease   . Hyperlipidemia   . Hypertension     Past Surgical History  Procedure Laterality Date  . Coronary artery bypass graft  11/24/2008  . Cardiac catheterization  11/20/2008    There were no vitals filed for this visit.  Visit Diagnosis:  Weakness of both arms      Subjective Assessment - 12/16/15 1442    Subjective  I feel I am improving.   Limitations B UE weakness.   Patient Stated Goals Patient reports he wants to be able to be independent and do everything he did before.  "Get my arms moving and working"   Pain Score 0-No pain    Used Judy/instructo board to flip pegs using fingers only 2 rows each hand. Completed card flipping with fingers only. Picked up coins starting largest to smallest and placed in container. Eccentric bilateral shoulder flexion x 20, supine completed boxing activity, circles, and alphabet with both arms at 90o of shoulder flexion. Nu Step complete 3, 2 minute expiations  Increasing resistance to 5. Patient needed cues for proper technique particularly with fine motor tasks.                          OT Education - 12/16/15 1451    Education provided Yes   Education Details Patient continuously educated on why the weakness as he keeps saying (I don't understand why this is happening).   Person(s)  Educated Patient   Methods Explanation   Comprehension Verbalized understanding             OT Long Term Goals - 11/23/15 1457    OT LONG TERM GOAL #1   Title Patient will improve B UE strength and range to be able to comb hair.   Baseline Unable   Time 12   Period Weeks   Status Partially Met   OT LONG TERM GOAL #2   Title Patient will be abe to self shave efficiently.   Baseline Unable   Time 12   Period Weeks   Status Achieved   OT LONG TERM GOAL #3   Title Patient will be able to donn shirt and jacket on.   Baseline needs assist   Time 12   Period Weeks   Status Partially Met   OT LONG TERM GOAL #4   Title Patient will be able to pull up pants.   Baseline unable   Time 12   Period Weeks   Status Achieved   OT LONG TERM GOAL #5   Title Will be able to donn socks   Baseline unable to donn socks   Time 12   Period Weeks   Status Achieved   OT LONG TERM GOAL #6   Title Patient will  be able to raise at least one arm past 90o of flexion to aid in ADL   Time 12   Period Weeks   Status New               Plan - 12/16/15 1502    Clinical Impression Statement Patient demonstrates improvement in strength with his biggest deficit remains bilateral shoulder flexion. His wife is helping less and less. He can eat, wash his face, shave. He is toleratiing more resistance on the  Nu step with 3, 2 minute reps with increase to level 5. patient still shows deficits interfering with his ADL (example can not put coat on completely.)   Pt will benefit from skilled therapeutic intervention in order to improve on the following deficits (Retired) Decreased activity tolerance;Decreased coordination;Decreased endurance;Decreased range of motion;Impaired flexibility;Decreased strength   OT Frequency 2x / week   OT Duration 12 weeks   OT Treatment/Interventions Self-care/ADL training;Moist Heat;DME and/or AE instruction;Energy conservation;Neuromuscular education;Therapeutic  exercise;Manual Therapy;Patient/family education;Therapeutic activities;Therapeutic exercises   OT Home Exercise Plan Patient continues to be faithful in peforming home program.   Consulted and Agree with Plan of Care Patient        Problem List Patient Active Problem List   Diagnosis Date Noted  . Cerebral thrombosis with cerebral infarction 10/12/2015  . Central cord syndrome ([place]) 10/11/2015  . Carotid stenosis 06/25/2013  . Preop cardiovascular exam 06/25/2013  . CAD (coronary artery disease) 08/05/2011  . Hyperlipidemia 08/05/2011  . HTN (hypertension) 08/05/2011  . Chest discomfort 08/05/2011   Sharon Mt, MS/OTR/L  Sharon Mt 12/16/2015, 3:10 PM  [place] MAIN [facility] SERVICES [address] [place], [place], [address] Phone: [phone]   Fax:  [phone]  Name: Jake Garcia MRN: [id number] Date of Birth: March 17, 1929

## 2015-12-22 ENCOUNTER — Ambulatory Visit: Payer: Medicare Other | Admitting: Occupational Therapy

## 2015-12-22 ENCOUNTER — Encounter: Payer: Self-pay | Admitting: Occupational Therapy

## 2015-12-22 DIAGNOSIS — R29898 Other symptoms and signs involving the musculoskeletal system: Secondary | ICD-10-CM | POA: Diagnosis not present

## 2015-12-22 NOTE — Patient Instructions (Signed)
Instructed in use of adapted nail cutter.

## 2015-12-22 NOTE — Therapy (Signed)
[place] MAIN [facility] SERVICES [address] [place], [place], [address] Phone: [phone]   Fax:  [phone]  Occupational Therapy Treatment  Patient Details  Name: Jake Garcia MRN: [id number] Date of Birth: 21-Apr-1929 No Data Recorded  Encounter Date: 12/22/2015      OT End of Session - 12/22/15 1510    Visit Number 17   Number of Visits 24   Date for OT Re-Evaluation 01/12/16   OT Start Time 1445   OT Stop Time 1530   OT Time Calculation (min) 45 min   Behavior During Therapy [facility] for tasks assessed/performed      Past Medical History  Diagnosis Date  . Coronary artery disease   . Hyperlipidemia   . Hypertension     Past Surgical History  Procedure Laterality Date  . Coronary artery bypass graft  11/24/2008  . Cardiac catheterization  11/20/2008    There were no vitals filed for this visit.  Visit Diagnosis:  Weakness of both arms      Subjective Assessment - 12/22/15 1508    Subjective  I feel like I am getting better, but those darn arms.   Pain Score 0-No pain    Used Purdue peg board with pegs only, then removed with both hands. Nu step setting 5, 3 sets of 2 min. Bilateral upper extremity eccentric shoulder flexion x 10, supine boxing, circles, and alphabet both upper extremities. Used adapted fingernail clips to successfully clip nails with cues. Cues required through out session for technique and positioning.                           OT Education - 12/22/15 1510    Education provided Yes   Education Details Educated that problem comes from spinal cord.   Person(s) Educated Patient   Methods Explanation   Comprehension Verbalized understanding;Verbal cues required             OT Long Term Goals - 11/23/15 1457    OT LONG TERM GOAL #1   Title Patient will improve B UE strength and range to be able to comb hair.   Baseline Unable   Time 12   Period Weeks   Status Partially Met   OT LONG TERM GOAL #2   Title Patient will be abe to self shave efficiently.   Baseline Unable   Time 12   Period Weeks   Status Achieved   OT LONG TERM GOAL #3   Title Patient will be able to donn shirt and jacket on.   Baseline needs assist   Time 12   Period Weeks   Status Partially Met   OT LONG TERM GOAL #4   Title Patient will be able to pull up pants.   Baseline unable   Time 12   Period Weeks   Status Achieved   OT LONG TERM GOAL #5   Title Will be able to donn socks   Baseline unable to donn socks   Time 12   Period Weeks   Status Achieved   OT LONG TERM GOAL #6   Title Patient will be able to raise at least one arm past 90o of flexion to aid in ADL   Time 12   Period Weeks   Status New               Plan - 12/22/15 1511    Clinical Impression Statement Patinet demonstrating improvement  and is tolerating more resistance with nustep. Wife helpiing less with ADL.[name] He can wash his face, shave, eat with no problem now.   Pt will benefit from skilled therapeutic intervention in order to improve on the following deficits (Retired) Decreased activity tolerance;Decreased coordination;Decreased endurance;Decreased range of motion;Impaired flexibility;Decreased strength   OT Treatment/Interventions Self-care/ADL training;Moist Heat;DME and/or AE instruction;Energy conservation;Neuromuscular education;Therapeutic exercise;Manual Therapy;Patient/family education;Therapeutic activities;Therapeutic exercises        Problem List Patient Active Problem List   Diagnosis Date Noted  . Cerebral thrombosis with cerebral infarction 10/12/2015  . Central cord syndrome ([place]) 10/11/2015  . Carotid stenosis 06/25/2013  . Preop cardiovascular exam 06/25/2013  . CAD (coronary artery disease) 08/05/2011  . Hyperlipidemia 08/05/2011  . HTN (hypertension) 08/05/2011  . Chest discomfort 08/05/2011    Sharon Mt 12/22/2015, 3:26 PM  [place] MAIN [facility] SERVICES [address] [place], [place], [address] Phone: [phone]   Fax:  [phone]  Name: Jake Garcia MRN: [id number] Date of Birth: June 13, 1929

## 2015-12-24 ENCOUNTER — Ambulatory Visit: Payer: Medicare Other | Admitting: Occupational Therapy

## 2015-12-24 ENCOUNTER — Encounter: Payer: Self-pay | Admitting: Occupational Therapy

## 2015-12-24 DIAGNOSIS — R29898 Other symptoms and signs involving the musculoskeletal system: Secondary | ICD-10-CM | POA: Diagnosis not present

## 2015-12-24 NOTE — Patient Instructions (Signed)
Instructed patient to stretch at home.

## 2015-12-24 NOTE — Therapy (Signed)
[place] MAIN [facility] SERVICES [address] [place], [place], [address] Phone: [phone]   Fax:  [phone]  Occupational Therapy Treatment  Patient Details  Name: Jake Garcia MRN: [id number] Date of Birth: [birth year] No Data Recorded  Encounter Date: 12/24/2015      OT End of Session - 12/24/15 1408    Visit Number 18   Number of Visits 24   Date for OT Re-Evaluation 01/12/16   OT Start Time 1345   OT Stop Time 1430   OT Time Calculation (min) 45 min   Behavior During Therapy [facility] for tasks assessed/performed      Past Medical History  Diagnosis Date  . Coronary artery disease   . Hyperlipidemia   . Hypertension     Past Surgical History  Procedure Laterality Date  . Coronary artery bypass graft  11/24/2008  . Cardiac catheterization  11/20/2008    There were no vitals filed for this visit.  Visit Diagnosis:  Weakness of both arms      Subjective Assessment - 12/24/15 1404    Subjective  I need a stretch.   Pain Score 0-No pain    In sitting, bilateral eccentric shoulder flexion x 10 supine-boxing exercise, alphabet and circle exercises at shoulder flexion 90o, stretching for B UE exercises shoulder flexion, external and internal rotation elbow flexion and extension, forearm supination and pronation, wrist flexion and extension, and hand flexion and extension.  NuStep setting 5 3 sets of 2 min. Picked up coins largest to smallest and placed in coin counter. Finding 1 1/4 inch pegs in red (medium) putty and placing in peg board.                            OT Education - 12/24/15 1407    Education provided Yes   Education Details Educated regarding how a tight muscle on the other side of a week muscle can cause a problem   Person(s) Educated Patient   Methods Explanation;Demonstration;Tactile cues;Verbal cues   Comprehension Verbalized understanding;Verbal cues required             OT Long  Term Goals - 11/23/15 1457    OT LONG TERM GOAL #1   Title Patient will improve B UE strength and range to be able to comb hair.   Baseline Unable   Time 12   Period Weeks   Status Partially Met   OT LONG TERM GOAL #2   Title Patient will be abe to self shave efficiently.   Baseline Unable   Time 12   Period Weeks   Status Achieved   OT LONG TERM GOAL #3   Title Patient will be able to donn shirt and jacket on.   Baseline needs assist   Time 12   Period Weeks   Status Partially Met   OT LONG TERM GOAL #4   Title Patient will be able to pull up pants.   Baseline unable   Time 12   Period Weeks   Status Achieved   OT LONG TERM GOAL #5   Title Will be able to donn socks   Baseline unable to donn socks   Time 12   Period Weeks   Status Achieved   OT LONG TERM GOAL #6   Title Patient will be able to raise at least one arm past 90o of flexion to aid in ADL   Time 12   Period  Weeks   Status New               Plan - 12/24/15 1418    Clinical Impression Statement Patient reports shoulders feeling tight so added stretches. Patient continues to improve with strenght, fine motor and ADL    Pt will benefit from skilled therapeutic intervention in order to improve on the following deficits (Retired) Decreased activity tolerance;Decreased coordination;Decreased endurance;Decreased range of motion;Impaired flexibility;Decreased strength   OT Treatment/Interventions Self-care/ADL training;Moist Heat;DME and/or AE instruction;Energy conservation;Neuromuscular education;Therapeutic exercise;Manual Therapy;Patient/family education;Therapeutic activities;Therapeutic exercises        Problem List Patient Active Problem List   Diagnosis Date Noted  . Cerebral thrombosis with cerebral infarction 10/12/2015  . Central cord syndrome ([place]) 10/11/2015  . Carotid stenosis 06/25/2013  . Preop cardiovascular exam 06/25/2013  . CAD (coronary artery disease) 08/05/2011  . Hyperlipidemia  08/05/2011  . HTN (hypertension) 08/05/2011  . Chest discomfort 08/05/2011    Sharon Mt 12/24/2015, 2:28 PM  [place] MAIN [facility] SERVICES [address] [place], [place], [address] Phone: [phone]   Fax:  [phone]  Name: Jake Garcia MRN: [id number] Date of Birth: 02-25-1929

## 2015-12-28 ENCOUNTER — Ambulatory Visit: Payer: Medicare Other | Admitting: Occupational Therapy

## 2015-12-30 ENCOUNTER — Encounter: Payer: Self-pay | Admitting: Occupational Therapy

## 2015-12-30 ENCOUNTER — Ambulatory Visit: Payer: Medicare Other | Admitting: Occupational Therapy

## 2015-12-30 DIAGNOSIS — R29898 Other symptoms and signs involving the musculoskeletal system: Secondary | ICD-10-CM | POA: Diagnosis not present

## 2015-12-30 NOTE — Therapy (Signed)
[place] MAIN [facility] SERVICES [address] [place], [place], [address] Phone: [phone]   Fax:  [phone]  Occupational Therapy Treatment/Progress Note  Patient Details  Name: Jake Garcia MRN: [id number] Date of Birth: [birth year] No Data Recorded  Encounter Date: 12/30/2015      OT End of Session - 12/30/15 1417    Visit Number 19   Number of Visits 24   Date for OT Re-Evaluation 01/12/16   OT Start Time 1345   OT Stop Time 1430   OT Time Calculation (min) 45 min   Behavior During Therapy [facility] for tasks assessed/performed      Past Medical History  Diagnosis Date  . Coronary artery disease   . Hyperlipidemia   . Hypertension     Past Surgical History  Procedure Laterality Date  . Coronary artery bypass graft  11/24/2008  . Cardiac catheterization  11/20/2008    There were no vitals filed for this visit.  Visit Diagnosis:  Weakness of both arms      Subjective Assessment - 12/30/15 1415    Subjective  I feel like I am doing better.   Patient Stated Goals Patient reports he wants to be able to be independent and do everything he did before.  "Get my arms moving and working"    In supine performed shoulder flexion (full range) then at 90o of shoulder flexion performed boxing movements, circles and alphabet. Increased resistance on Nustep to 6 2 sets of 2 minutes. Used Purdue peg board all three components and removed all. Patient struggles more with right dominant than left. Discussed some techniques for donning shirt.  Patient has now achieved 4 of 6 goals. He can brush his hair, shave, donn pants and donn socks. Both arms have increased from 40o of shoulder flexion to 60o on right and 61o on left. 9 hole peg 29 seconds on right and 37 seconds on left. G codes have improve for self care from 40-60% diminished to 20-20%.                          OT Education - 12/30/15 1416    Education provided Yes   Education Details Education on soarness in back.   Person(s) Educated Patient   Methods Explanation   Comprehension Verbalized understanding             OT Long Term Goals - 12/30/15 1356    OT LONG TERM GOAL #1   Title Patient will improve B UE strength and range to be able to comb hair.   Time 12   Period Weeks   Status Achieved   OT LONG TERM GOAL #2   Title Patient will be abe to self shave efficiently.   Time 12   Period Weeks   Status Achieved   OT LONG TERM GOAL #3   Title Patient will be able to donn shirt and jacket on.   Time 12   Period Weeks   Status Partially Met   OT LONG TERM GOAL #4   Title Patient will be able to pull up pants.   Time 12   Period Weeks   Status Achieved   OT LONG TERM GOAL #5   Title Will be able to donn socks   Time 12   Period Weeks   Status Achieved   OT LONG TERM GOAL #6   Title Patient will be able to raise at least one  arm past 90o of flexion to aid in ADL   Time 12   Period Weeks   Status Partially Met               Plan - 2016/01/17 1418    Clinical Impression Statement Reports a soreness in back. Patient has now achieved 4 of 6 goals. He can brush his hair, shave, donn pants and donn socks. Both arms have increased from 40o of shoulder flexion to 60o on right and 61o on left. (full range when supine. 9 hole peg 29 seconds on right and 37 seconds on left. G codes have improve for self care from 40-60% diminished to 20-20%.   Pt will benefit from skilled therapeutic intervention in order to improve on the following deficits (Retired) Decreased activity tolerance;Decreased coordination;Decreased endurance;Decreased range of motion;Impaired flexibility;Decreased strength   OT Frequency 2x / week   OT Treatment/Interventions Self-care/ADL training;Moist Heat;DME and/or AE instruction;Energy conservation;Neuromuscular education;Therapeutic exercise;Manual Therapy;Patient/family education;Therapeutic activities;Therapeutic  exercises   OT Home Exercise Plan Patient continues to be faithful in performing home program.   Consulted and Agree with Plan of Care Patient          G-Codes - Jan 17, 2016 1410    Functional Assessment Tool Used clinical judgement   Functional Limitation Self care   Self Care Current Status [phone]) At least 20 percent but less than 40 percent impaired, limited or restricted   Self Care Goal Status (S1423) At least 1 percent but less than 20 percent impaired, limited or restricted      Problem List Patient Active Problem List   Diagnosis Date Noted  . Cerebral thrombosis with cerebral infarction 10/12/2015  . Central cord syndrome ([place]) 10/11/2015  . Carotid stenosis 06/25/2013  . Preop cardiovascular exam 06/25/2013  . CAD (coronary artery disease) 08/05/2011  . Hyperlipidemia 08/05/2011  . HTN (hypertension) 08/05/2011  . Chest discomfort 08/05/2011    Sharon Mt January 17, 2016, 2:30 PM  [place] MAIN [facility] SERVICES [address] [place], [place], [address] Phone: [phone]   Fax:  [phone]  Name: Jake Garcia MRN: [id number] Date of Birth: 20-Aug-1929

## 2015-12-30 NOTE — Patient Instructions (Signed)
Cues for technique in exercises and fine motor.

## 2015-12-31 ENCOUNTER — Ambulatory Visit: Payer: Medicare Other | Admitting: Occupational Therapy

## 2015-12-31 ENCOUNTER — Encounter: Payer: Self-pay | Admitting: Occupational Therapy

## 2015-12-31 DIAGNOSIS — R29898 Other symptoms and signs involving the musculoskeletal system: Secondary | ICD-10-CM

## 2015-12-31 NOTE — Patient Instructions (Signed)
Instructions on techiques for donning shirt.

## 2015-12-31 NOTE — Therapy (Signed)
[place] MAIN [facility] SERVICES [address] [place], [place], [address] Phone: [phone]   Fax:  [phone]  Occupational Therapy Treatment  Patient Details  Name: Jake Garcia MRN: [id number] Date of Birth: [birth year] No Data Recorded  Encounter Date: 12/31/2015      OT End of Session - 12/30/15 1417    Visit Number 19   Number of Visits 24   Date for OT Re-Evaluation 01/12/16   OT Start Time 1345   OT Stop Time 1430   OT Time Calculation (min) 45 min   Behavior During Therapy [facility] for tasks assessed/performed      Past Medical History  Diagnosis Date  . Coronary artery disease   . Hyperlipidemia   . Hypertension     Past Surgical History  Procedure Laterality Date  . Coronary artery bypass graft  11/24/2008  . Cardiac catheterization  11/20/2008    There were no vitals filed for this visit.  Visit Diagnosis:  Weakness of both arms      Subjective Assessment - 12/31/15 1619    Subjective  I feel I am dong better but I just can't get these arms to go higher.   Pain Score --  Reported discomfort in middle of back (thorasic)    After reporting some discomfort in thoracic back, placed hot pack on mat while dong supine exercises. In supine, put arms through active full range of motion, all planes. Boxing, circle and alphabet exercises with arms at 90o of shoulder flexion.Nustep set on 6, 3 sets of 2 min. Used grooved peg board placing pegs in order and removed alternating fingers. Practiced techniques for donning jacket and with extra time and cues for technique, patient able to donn jacket with out assistive device.                            OT Education - 12/31/15 1621    Education provided Yes   Education Details After reporting his arms are itching, educated that this could be a good thing.   Person(s) Educated Patient   Methods Explanation   Comprehension Verbalized understanding             OT Long Term Goals - 12/30/15 1356    OT LONG TERM GOAL #1   Title Patient will improve B UE strength and range to be able to comb hair.   Time 12   Period Weeks   Status Achieved   OT LONG TERM GOAL #2   Title Patient will be abe to self shave efficiently.   Time 12   Period Weeks   Status Achieved   OT LONG TERM GOAL #3   Title Patient will be able to donn shirt and jacket on.   Time 12   Period Weeks   Status Partially Met   OT LONG TERM GOAL #4   Title Patient will be able to pull up pants.   Time 12   Period Weeks   Status Achieved   OT LONG TERM GOAL #5   Title Will be able to donn socks   Time 12   Period Weeks   Status Achieved   OT LONG TERM GOAL #6   Title Patient will be able to raise at least one arm past 90o of flexion to aid in ADL   Time 12   Period Weeks   Status Partially Met  Plan - 01/22/2016 1418    Clinical Impression Statement Reports a soarness in back. Patient has now achieved 4 of 6 goals. He can brush his hair, shave, donn pants and donn socks. Both arms have increaed from 40o of shoulder flexion to 60oon right and 61o on left. 9 hole peg 29 seconds on right and 37 seconds on left. G codes have improve for self care from 40-60% diminished to 20-20%.   Pt will benefit from skilled therapeutic intervention in order to improve on the following deficits (Retired) Decreased activity tolerance;Decreased coordination;Decreased endurance;Decreased range of motion;Impaired flexibility;Decreased strength   OT Frequency 2x / week   OT Treatment/Interventions Self-care/ADL training;Moist Heat;DME and/or AE instruction;Energy conservation;Neuromuscular education;Therapeutic exercise;Manual Therapy;Patient/family education;Therapeutic activities;Therapeutic exercises   OT Home Exercise Plan Patient continues to be faithful in peforming home program.   Consulted and Agree with Plan of Care Patient          G-Codes - 2016-01-22 1410     Functional Assessment Tool Used clinical judgement   Functional Limitation Self care   Self Care Current Status [phone]) At least 20 percent but less than 40 percent impaired, limited or restricted   Self Care Goal Status (U8891) At least 1 percent but less than 20 percent impaired, limited or restricted      Problem List Patient Active Problem List   Diagnosis Date Noted  . Cerebral thrombosis with cerebral infarction 10/12/2015  . Central cord syndrome ([place]) 10/11/2015  . Carotid stenosis 06/25/2013  . Preop cardiovascular exam 06/25/2013  . CAD (coronary artery disease) 08/05/2011  . Hyperlipidemia 08/05/2011  . HTN (hypertension) 08/05/2011  . Chest discomfort 08/05/2011   Sharon Mt, MS/OTR/L  Sharon Mt 12/31/2015, 4:24 PM  [place] MAIN [facility] SERVICES [address] [place], [place], [address] Phone: [phone]   Fax:  [phone]  Name: Jake Garcia MRN: [id number] Date of Birth: [birth year]

## 2016-01-04 ENCOUNTER — Ambulatory Visit: Payer: Medicare Other | Admitting: Occupational Therapy

## 2016-01-04 ENCOUNTER — Encounter: Payer: Self-pay | Admitting: Occupational Therapy

## 2016-01-04 DIAGNOSIS — R29898 Other symptoms and signs involving the musculoskeletal system: Secondary | ICD-10-CM

## 2016-01-04 NOTE — Patient Instructions (Signed)
Instructed patient in use of hammer for hand and forearm strengthening at home.

## 2016-01-04 NOTE — Therapy (Signed)
[place] MAIN [facility] SERVICES [address] [place], [place], [address] Phone: [phone]   Fax:  [phone]  Occupational Therapy Treatment  Patient Details  Name: Jake Garcia MRN: [id number] Date of Birth: 1929-09-04 No Data Recorded  Encounter Date: 01/04/2016      OT End of Session - 01/04/16 1516    Visit Number 20   Number of Visits 24   Date for OT Re-Evaluation 01/12/16   OT Start Time 1430   OT Stop Time 1515   OT Time Calculation (min) 45 min   Behavior During Therapy [facility] for tasks assessed/performed      Past Medical History  Diagnosis Date  . Coronary artery disease   . Hyperlipidemia   . Hypertension     Past Surgical History  Procedure Laterality Date  . Coronary artery bypass graft  11/24/2008  . Cardiac catheterization  11/20/2008    There were no vitals filed for this visit.  Visit Diagnosis:  Weakness of both arms      Subjective Assessment - 01/04/16 1513    Subjective  I don't know how this happened.   Patient Stated Goals Patient reports he wants to be able to be independent and do everything he did before.  "Get my arms moving and working"    Used Judy/instructo board to flip pegs Used color coded clips from hardest to easiest for pinch (unable to squeeze black which is the hardest). Used grooved peg board placing pegs in order and removed alternating fingers. Cues for technique. Completed twisty grip from hardest to easiest on 5 settings with 20 repetitions. 16 oz hammer with supination and pronation each hand 40 reps.  SCIFIT pro II 3 min forward, 3 min backwards. Cues for technique.                          OT Education - 01/04/16 1515    Education provided Yes   Education Details Reviewed again reason for arm weakness and numbness.   Person(s) Educated Patient   Methods Explanation   Comprehension Verbalized understanding             OT Long Term Goals - 12/30/15  1356    OT LONG TERM GOAL #1   Title Patient will improve B UE strength and range to be able to comb hair.   Time 12   Period Weeks   Status Achieved   OT LONG TERM GOAL #2   Title Patient will be abe to self shave efficiently.   Time 12   Period Weeks   Status Achieved   OT LONG TERM GOAL #3   Title Patient will be able to donn shirt and jacket on.   Time 12   Period Weeks   Status Partially Met   OT LONG TERM GOAL #4   Title Patient will be able to pull up pants.   Time 12   Period Weeks   Status Achieved   OT LONG TERM GOAL #5   Title Will be able to donn socks   Time 12   Period Weeks   Status Achieved   OT LONG TERM GOAL #6   Title Patient will be able to raise at least one arm past 90o of flexion to aid in ADL   Time 12   Period Weeks   Status Partially Met               Plan -  01/04/16 1517    Clinical Impression Statement Patient continues to make slow steady progress with range of motion, strength, and fine motor of both upper extremities. He has aceived 4 of 6 goals.   Pt will benefit from skilled therapeutic intervention in order to improve on the following deficits (Retired) Decreased activity tolerance;Decreased coordination;Decreased endurance;Decreased range of motion;Impaired flexibility;Decreased strength   OT Treatment/Interventions Self-care/ADL training;Moist Heat;DME and/or AE instruction;Energy conservation;Neuromuscular education;Therapeutic exercise;Manual Therapy;Patient/family education;Therapeutic activities;Therapeutic exercises        Problem List Patient Active Problem List   Diagnosis Date Noted  . Cerebral thrombosis with cerebral infarction 10/12/2015  . Central cord syndrome ([place]) 10/11/2015  . Carotid stenosis 06/25/2013  . Preop cardiovascular exam 06/25/2013  . CAD (coronary artery disease) 08/05/2011  . Hyperlipidemia 08/05/2011  . HTN (hypertension) 08/05/2011  . Chest discomfort 08/05/2011   Sharon Mt, MS/OTR/L   Sharon Mt 01/04/2016, 3:21 PM  [place] MAIN [facility] SERVICES [address] [place], [place], [address] Phone: [phone]   Fax:  [phone]  Name: Jake Garcia MRN: [id number] Date of Birth: 1929/03/07

## 2016-01-06 ENCOUNTER — Encounter: Payer: Self-pay | Admitting: Occupational Therapy

## 2016-01-06 ENCOUNTER — Ambulatory Visit: Payer: Medicare Other | Admitting: Occupational Therapy

## 2016-01-06 DIAGNOSIS — R29898 Other symptoms and signs involving the musculoskeletal system: Secondary | ICD-10-CM | POA: Diagnosis not present

## 2016-01-06 NOTE — Patient Instructions (Signed)
Instructed in use of SCIFit pro II

## 2016-01-06 NOTE — Therapy (Signed)
[place] MAIN [facility] SERVICES [address] [place], [place], [address] Phone: [phone]   Fax:  [phone]  Occupational Therapy Treatment  Patient Details  Name: Jake Garcia MRN: [id number] Date of Birth: [birth year] No Data Recorded  Encounter Date: 01/06/2016      OT End of Session - 01/06/16 1445    Visit Number 21   Number of Visits 24   Date for OT Re-Evaluation 01/12/16   OT Start Time 1430   OT Stop Time 1515   OT Time Calculation (min) 45 min   Behavior During Therapy --  frustrated      Past Medical History  Diagnosis Date  . Coronary artery disease   . Hyperlipidemia   . Hypertension     Past Surgical History  Procedure Laterality Date  . Coronary artery bypass graft  11/24/2008  . Cardiac catheterization  11/20/2008    There were no vitals filed for this visit.  Visit Diagnosis:  Weakness of both arms      Subjective Assessment - 01/06/16 1442    Subjective  I am going to beat this.   Patient Stated Goals Patient reports he wants to be able to be independent and do everything he did before.  "Get my arms moving and working"    .Used grooved peg board placing pegs in order and removed. Needed some assist from other hand to position. Completed card flipping. 16 oz hammer for pronation and supination both hands x 50. Completed twisty grip from hardest to easiest on 5 settings with 25 repetitions.  SCIFIT II setting 3 2 min forward 2 min backward.  NUSTEP 2 sets of 2 min resistance 6. In supine with shoulder flexed to 90o completed boxing exercise, circles and alphabet both upper extremities. Therapist with patient at all times for safety. Cues needed for technique.                               OT Long Term Goals - 12/30/15 1356    OT LONG TERM GOAL #1   Title Patient will improve B UE strength and range to be able to comb hair.   Time 12   Period Weeks   Status Achieved   OT LONG  TERM GOAL #2   Title Patient will be abe to self shave efficiently.   Time 12   Period Weeks   Status Achieved   OT LONG TERM GOAL #3   Title Patient will be able to donn shirt and jacket on.   Time 12   Period Weeks   Status Partially Met   OT LONG TERM GOAL #4   Title Patient will be able to pull up pants.   Time 12   Period Weeks   Status Achieved   OT LONG TERM GOAL #5   Title Will be able to donn socks   Time 12   Period Weeks   Status Achieved   OT LONG TERM GOAL #6   Title Patient will be able to raise at least one arm past 90o of flexion to aid in ADL   Time 12   Period Weeks   Status Partially Met               Plan - 01/06/16 1448    Clinical Impression Statement Patient making slow progress with fine motor and strenght and range of motion.  He has achieved 4 of  6 goals.   Pt will benefit from skilled therapeutic intervention in order to improve on the following deficits (Retired) Decreased activity tolerance;Decreased coordination;Decreased endurance;Decreased range of motion;Impaired flexibility;Decreased strength   OT Treatment/Interventions Self-care/ADL training;Moist Heat;DME and/or AE instruction;Energy conservation;Neuromuscular education;Therapeutic exercise;Manual Therapy;Patient/family education;Therapeutic activities;Therapeutic exercises        Problem List Patient Active Problem List   Diagnosis Date Noted  . Cerebral thrombosis with cerebral infarction 10/12/2015  . Central cord syndrome ([place]) 10/11/2015  . Carotid stenosis 06/25/2013  . Preop cardiovascular exam 06/25/2013  . CAD (coronary artery disease) 08/05/2011  . Hyperlipidemia 08/05/2011  . HTN (hypertension) 08/05/2011  . Chest discomfort 08/05/2011   Sharon Mt, MS/OTR/L  Sharon Mt 01/06/2016, 2:53 PM  [place] MAIN [facility] SERVICES [address] [place], [place], [address] Phone: [phone]   Fax:  [phone]  Name: Jake Garcia MRN: [id number] Date of Birth: 25-Nov-1929

## 2016-01-11 ENCOUNTER — Encounter: Payer: Self-pay | Admitting: Occupational Therapy

## 2016-01-11 ENCOUNTER — Ambulatory Visit: Payer: Medicare Other | Admitting: Occupational Therapy

## 2016-01-11 DIAGNOSIS — R29898 Other symptoms and signs involving the musculoskeletal system: Secondary | ICD-10-CM

## 2016-01-11 NOTE — Patient Instructions (Signed)
Instructed in home eccentric exercises.

## 2016-01-11 NOTE — Therapy (Signed)
[place] MAIN [facility] SERVICES [address] [place], [place], [address] Phone: [phone]   Fax:  [phone]  Occupational Therapy Treatment  Patient Details  Name: Jake Garcia MRN: [id number] Date of Birth: 1929-01-15 No Data Recorded  Encounter Date: 01/11/2016      OT End of Session - 01/11/16 1350    Visit Number 22   Number of Visits 24   Date for OT Re-Evaluation 01/12/16   OT Start Time 1340   OT Stop Time 1420   OT Time Calculation (min) 40 min   Behavior During Therapy --  frustrated      Past Medical History  Diagnosis Date  . Coronary artery disease   . Hyperlipidemia   . Hypertension     Past Surgical History  Procedure Laterality Date  . Coronary artery bypass graft  11/24/2008  . Cardiac catheterization  11/20/2008    There were no vitals filed for this visit.  Visit Diagnosis:  Weakness of both arms      Subjective Assessment - 01/11/16 1348    Subjective  I just can't get my arms up.   Limitations B UE weakness.   Patient Stated Goals Patient reports he wants to be able to be independent and do everything he did before.  "Get my arms moving and working"    Prone exercises ROM, shoulders at 90o, circles, alphabet, boxing exercises. SCIFIT II 3 min forward and 3 min backwards, setting 3. NUSTEP setting 6, 3 sets of 2 minutes.  Picked up coins largest to smallest and placed in coin counter. Used grooved peg board placing pegs in order and removed alternating fingers. Remove nut from long bolt with one hand. Cues for technique and monitor for nustep and SCIFIT II.                          OT Education - 01/11/16 1349    Education provided Yes   Education Details Educated in what a trigger finger is.   Person(s) Educated Patient   Methods Explanation;Tactile cues   Comprehension Verbalized understanding             OT Long Term Goals - 01/11/16 1351    OT LONG TERM GOAL #1   Title Patient will improve B UE strength and range to be able to comb hair.   Time 12   Period Weeks   Status Achieved   OT LONG TERM GOAL #2   Title Patient will be abe to self shave efficiently.   Time 12   Period Weeks   Status Achieved   OT LONG TERM GOAL #3   Title Patient will be able to donn shirt and jacket on.   Time 12   Period Weeks   Status Partially Met   OT LONG TERM GOAL #4   Title Patient will be able to pull up pants.   Time 12   Period Weeks   Status Achieved   OT LONG TERM GOAL #5   Title Will be able to donn socks   Time 12   Period Weeks   Status Achieved   OT LONG TERM GOAL #6   Title Patient will be able to raise at least one arm past 90o of flexion to aid in ADL   Time 12   Period Weeks   Status Partially Met               Plan -  01/11/16 1351    Clinical Impression Statement Reports a soreness in back. Patient has now achieved 4 of 6 goals. He can brush his hair, shave, donn pants and donn socks. Both arms have increased from 40o of shoulder flexion to 60o on right and 61o on left. (full range when supine. 9 hole peg 29 seconds on right and 37 seconds on left. G codes have improve for self care from 40-60% diminished to 20-20%.   Pt will benefit from skilled therapeutic intervention in order to improve on the following deficits (Retired) Decreased activity tolerance;Decreased coordination;Decreased endurance;Decreased range of motion;Impaired flexibility;Decreased strength   OT Treatment/Interventions Self-care/ADL training;Moist Heat;DME and/or AE instruction;Energy conservation;Neuromuscular education;Therapeutic exercise;Manual Therapy;Patient/family education;Therapeutic activities;Therapeutic exercises   Consulted and Agree with Plan of Care Patient        Problem List Patient Active Problem List   Diagnosis Date Noted  . Cerebral thrombosis with cerebral infarction 10/12/2015  . Central cord syndrome ([place]) 10/11/2015  . Carotid  stenosis 06/25/2013  . Preop cardiovascular exam 06/25/2013  . CAD (coronary artery disease) 08/05/2011  . Hyperlipidemia 08/05/2011  . HTN (hypertension) 08/05/2011  . Chest discomfort 08/05/2011   Sharon Mt, MS/OTR/L  Sharon Mt 01/11/2016, 1:58 PM  [place] MAIN [facility] SERVICES [address] [place], [place], [address] Phone: [phone]   Fax:  [phone]  Name: ZYAIRE MCCLEOD MRN: [id number] Date of Birth: 1929-05-06

## 2016-01-13 ENCOUNTER — Ambulatory Visit: Payer: Medicare Other | Admitting: Occupational Therapy

## 2016-01-13 ENCOUNTER — Encounter: Payer: Self-pay | Admitting: Occupational Therapy

## 2016-01-13 DIAGNOSIS — R29898 Other symptoms and signs involving the musculoskeletal system: Secondary | ICD-10-CM

## 2016-01-13 NOTE — Therapy (Signed)
[place] MAIN [facility] SERVICES [address] [place], [place], [address] Phone: [phone]   Fax:  [phone]  Occupational Therapy Treatment  Patient Details  Name: Jake Garcia MRN: [id number] Date of Birth: [birth year] No Data Recorded  Encounter Date: 01/13/2016      OT End of Session - 01/13/16 1355    Visit Number 1   Number of Visits 24   Date for OT Re-Evaluation 04/04/16   OT Start Time 1345   Behavior During Therapy [facility] for tasks assessed/performed      Past Medical History  Diagnosis Date  . Coronary artery disease   . Hyperlipidemia   . Hypertension     Past Surgical History  Procedure Laterality Date  . Coronary artery bypass graft  11/24/2008  . Cardiac catheterization  11/20/2008    There were no vitals filed for this visit.  Visit Diagnosis:  Weakness of both arms      Subjective Assessment - 01/13/16 1353    Subjective  My left arm is doing better.   Limitations B UE weakness.   Patient Stated Goals Patient reports he wants to be able to be independent and do everything he did before.  "Get my arms moving and working"    In supine, completed boxing, alphabet, and circle exercises with shoulders at 90o. SciFit pro II setting 3, 3 min forward and 3 min backward.NuStep setting 7, 3 sets of 2 min.  Picked up coins largest to smallest and placed in coin counter Removed nut from bolt using one hand and thumb. Completed card flipping with using wrist motion.                           OT Education - 01/13/16 1354    Education provided Yes   Education Details HEP   Person(s) Educated Patient   Methods Explanation;Demonstration   Comprehension Verbalized understanding;Returned demonstration;Tactile cues required             OT Long Term Goals - 01/11/16 1351    OT LONG TERM GOAL #1   Title Patient will improve B UE strength and range to be able to comb hair.   Time 12   Period Weeks    Status Achieved   OT LONG TERM GOAL #2   Title Patient will be abe to self shave efficiently.   Time 12   Period Weeks   Status Achieved   OT LONG TERM GOAL #3   Title Patient will be able to donn shirt and jacket on.   Time 12   Period Weeks   Status Partially Met   OT LONG TERM GOAL #4   Title Patient will be able to pull up pants.   Time 12   Period Weeks   Status Achieved   OT LONG TERM GOAL #5   Title Will be able to donn socks   Time 12   Period Weeks   Status Achieved   OT LONG TERM GOAL #6   Title Patient will be able to raise at least one arm past 90o of flexion to aid in ADL   Time 12   Period Weeks   Status Partially Met               Plan - 01/13/16 1359    Clinical Impression Statement Patient progressing in goals with ADL and strength and range of motion. His fine motor is improving. He is  now able to put on a shirt with extra time.   Pt will benefit from skilled therapeutic intervention in order to improve on the following deficits (Retired) Decreased activity tolerance;Decreased coordination;Decreased endurance;Decreased range of motion;Impaired flexibility;Decreased strength   OT Treatment/Interventions Self-care/ADL training;Moist Heat;DME and/or AE instruction;Energy conservation;Neuromuscular education;Therapeutic exercise;Manual Therapy;Patient/family education;Therapeutic activities;Therapeutic exercises   OT Home Exercise Plan Patient continues to be faithful in peforming home program.        Problem List Patient Active Problem List   Diagnosis Date Noted  . Cerebral thrombosis with cerebral infarction 10/12/2015  . Central cord syndrome ([place]) 10/11/2015  . Carotid stenosis 06/25/2013  . Preop cardiovascular exam 06/25/2013  . CAD (coronary artery disease) 08/05/2011  . Hyperlipidemia 08/05/2011  . HTN (hypertension) 08/05/2011  . Chest discomfort 08/05/2011   Sharon Mt, MS/OTR/L  Sharon Mt 01/13/2016, 2:05 PM  [place] MAIN [facility] SERVICES [address] [place], [place], [address] Phone: [phone]   Fax:  [phone]  Name: Jake Garcia MRN: [id number] Date of Birth: Sep 27, 1929

## 2016-01-13 NOTE — Patient Instructions (Signed)
Reviewed HEP.   

## 2016-01-18 ENCOUNTER — Encounter: Payer: Self-pay | Admitting: Occupational Therapy

## 2016-01-18 ENCOUNTER — Ambulatory Visit: Payer: Medicare Other | Admitting: Occupational Therapy

## 2016-01-18 DIAGNOSIS — R29898 Other symptoms and signs involving the musculoskeletal system: Secondary | ICD-10-CM

## 2016-01-18 NOTE — Therapy (Signed)
[place] MAIN [facility] SERVICES [address] [place], [place], [address] Phone: [phone]   Fax:  [phone]  Occupational Therapy Treatment  Patient Details  Name: Jake Garcia MRN: [id number] Date of Birth: [birth year] No Data Recorded  Encounter Date: 01/18/2016      OT End of Session - 01/18/16 1425    Visit Number 2   Number of Visits 24   Date for OT Re-Evaluation 04/04/16   OT Start Time 1345   OT Stop Time 1425   OT Time Calculation (min) 40 min      Past Medical History  Diagnosis Date  . Coronary artery disease   . Hyperlipidemia   . Hypertension     Past Surgical History  Procedure Laterality Date  . Coronary artery bypass graft  11/24/2008  . Cardiac catheterization  11/20/2008    There were no vitals filed for this visit.  Visit Diagnosis:  Weakness of both arms      Subjective Assessment - 01/18/16 1415    Subjective  I don't know why this is happening.   Limitations B UE weakness.    Removed nut from long bolt using right thumb. Used Velcro checkers for opposition and pinch Picked up coins largest to smallest and placed in coin counter Completed forearm hammer rotations supination and pronation x100.  In supine with shoulders at 90o, completed boxing activity circles and alphabet motions. SCIFIT pro II 3 min forward 3 min backwards on setting 3.  Cues needed for technique and supervision for SCIFIT.                          OT Education - 01/18/16 1424    Education provided Yes   Education Details why his arms are weak.   Person(s) Educated Patient   Methods Explanation   Comprehension Verbalized understanding             OT Long Term Goals - 01/11/16 1351    OT LONG TERM GOAL #1   Title Patient will improve B UE strength and range to be able to comb hair.   Time 12   Period Weeks   Status Achieved   OT LONG TERM GOAL #2   Title Patient will be abe to self shave  efficiently.   Time 12   Period Weeks   Status Achieved   OT LONG TERM GOAL #3   Title Patient will be able to donn shirt and jacket on.   Time 12   Period Weeks   Status Partially Met   OT LONG TERM GOAL #4   Title Patient will be able to pull up pants.   Time 12   Period Weeks   Status Achieved   OT LONG TERM GOAL #5   Title Will be able to donn socks   Time 12   Period Weeks   Status Achieved   OT LONG TERM GOAL #6   Title Patient will be able to raise at least one arm past 90o of flexion to aid in ADL   Time 12   Period Weeks   Status Partially Met               Plan - 01/18/16 1427    Clinical Impression Statement Patient showing improved strength as he can now do 100 reps of pronation and supination with 16 oz hammer.   Pt will benefit from skilled therapeutic intervention in order to  improve on the following deficits (Retired) Decreased activity tolerance;Decreased coordination;Decreased endurance;Decreased range of motion;Impaired flexibility;Decreased strength   OT Treatment/Interventions Self-care/ADL training;Moist Heat;DME and/or AE instruction;Energy conservation;Neuromuscular education;Therapeutic exercise;Manual Therapy;Patient/family education;Therapeutic activities;Therapeutic exercises   Consulted and Agree with Plan of Care Patient        Problem List Patient Active Problem List   Diagnosis Date Noted  . Cerebral thrombosis with cerebral infarction 10/12/2015  . Central cord syndrome ([place]) 10/11/2015  . Carotid stenosis 06/25/2013  . Preop cardiovascular exam 06/25/2013  . CAD (coronary artery disease) 08/05/2011  . Hyperlipidemia 08/05/2011  . HTN (hypertension) 08/05/2011  . Chest discomfort 08/05/2011    Sharon Mt 01/18/2016, 2:35 PM  [place] MAIN [facility] SERVICES [address] [place], [place], [address] Phone: [phone]   Fax:  [phone]  Name: Jake Garcia MRN: [id number] Date of  Birth: [birth year]

## 2016-01-18 NOTE — Patient Instructions (Signed)
Re-instructed patient in why his arms are weak.

## 2016-01-20 ENCOUNTER — Ambulatory Visit: Payer: Medicare Other | Admitting: Occupational Therapy

## 2016-01-25 ENCOUNTER — Ambulatory Visit: Payer: Medicare Other | Admitting: Occupational Therapy

## 2016-01-28 ENCOUNTER — Ambulatory Visit: Payer: Medicare Other | Admitting: Occupational Therapy

## 2016-02-03 ENCOUNTER — Encounter: Payer: Medicare Other | Admitting: Occupational Therapy

## 2016-02-05 NOTE — Therapy (Signed)
[place] MAIN [facility] SERVICES [address] [place], [place], [address] Phone: [phone]   Fax:  [phone]  Occupational Therapy Treatment  Patient Details  Name: Jake Garcia MRN: [id number] Date of Birth: February 19, 1929 No Data Recorded  Encounter Date: 11/04/2015      OT End of Session - 02/05/16 1434    Visit Number 5   Number of Visits 24   Date for OT Re-Evaluation 01/12/16      Past Medical History  Diagnosis Date  . Coronary artery disease   . Hyperlipidemia   . Hypertension     Past Surgical History  Procedure Laterality Date  . Coronary artery bypass graft  11/24/2008  . Cardiac catheterization  11/20/2008    There were no vitals filed for this visit.  Visit Diagnosis:  Weakness of both arms      Subjective Assessment - 02/05/16 1434    Subjective  I gotta get these arms going                 [place]  [place], [place], [address]  Phone: [phone] Fax: [phone]  Occupational Therapy Treatment  Patient Details  Name: Jake Garcia  MRN: [id number]  Date of Birth: 05-12-1929  No Data Recorded  Encounter Date: 11/04/2015    Past Medical History   Diagnosis  Date   .  Coronary artery disease    .  Hyperlipidemia    .  Hypertension      Past Surgical History   Procedure  Laterality  Date   .  Coronary artery bypass graft   11/24/2008   .  Cardiac catheterization   11/20/2008    There were no vitals filed for this visit.  Visit Diagnosis: Weakness of both arms  Supine bilateral upper extremity exercises active assistive shoulder flexion, active external and internal rotation resisted elbow flexion and extension, forearm supination and pronation, wrist flexion and extension, 5 repetitions. Chin tucks at wall, wall climbs, Used color coded clips from hardest to easiest for pinch both hands. 4 lb weight bicep curls x 25 on  right left 1 lb weight x 25. Completed connect 4 for reaching both arms.                   OT Education - 02/05/16 1434    Education provided Yes             OT Long Term Goals - 01/11/16 1351    OT LONG TERM GOAL #1   Title Patient will improve B UE strength and range to be able to comb hair.   Time 12   Period Weeks   Status Achieved   OT LONG TERM GOAL #2   Title Patient will be abe to self shave efficiently.   Time 12   Period Weeks   Status Achieved   OT LONG TERM GOAL #3   Title Patient will be able to donn shirt and jacket on.   Time 12   Period Weeks   Status Partially Met   OT LONG TERM GOAL #4   Title Patient will be able to pull up pants.   Time 12   Period Weeks   Status Achieved   OT LONG TERM GOAL #5   Title Will be able to donn socks   Time 12   Period Weeks   Status Achieved   OT LONG  TERM GOAL #6   Title Patient will be able to raise at least one arm past 90o of flexion to aid in ADL   Time 12   Period Weeks   Status Partially Met               Plan - 02/05/16 1437    Clinical Impression Statement Patient shows improvement with regard to fine motor and strength, bilateral shoulder flexion is still frustrating for patient. Consentrating more on shoulder flexion.   Pt will benefit from skilled therapeutic intervention in order to improve on the following deficits (Retired) Decreased activity tolerance;Decreased coordination;Decreased endurance;Decreased range of motion;Impaired flexibility;Decreased strength   OT Frequency 2x / week   OT Duration 12 weeks   OT Treatment/Interventions Self-care/ADL training;Moist Heat;DME and/or AE instruction;Energy conservation;Neuromuscular education;Therapeutic exercise;Manual Therapy;Patient/family education;Therapeutic activities;Therapeutic exercises   Consulted and Agree with Plan of Care Patient        Problem List Patient Active Problem List   Diagnosis Date Noted  . Cerebral  thrombosis with cerebral infarction 10/12/2015  . Central cord syndrome ([place]) 10/11/2015  . Carotid stenosis 06/25/2013  . Preop cardiovascular exam 06/25/2013  . CAD (coronary artery disease) 08/05/2011  . Hyperlipidemia 08/05/2011  . HTN (hypertension) 08/05/2011  . Chest discomfort 08/05/2011   Sharon Mt, MS/OTR/L  Sharon Mt 02/05/2016, 2:39 PM  [place] MAIN [facility] SERVICES [address] [place], [place], [address] Phone: [phone]   Fax:  [phone]  Name: Jake Garcia MRN: [id number] Date of Birth: [birth year]

## 2016-02-09 ENCOUNTER — Encounter: Payer: Medicare Other | Admitting: Occupational Therapy

## 2016-02-11 ENCOUNTER — Encounter: Payer: Medicare Other | Admitting: Occupational Therapy

## 2016-02-15 ENCOUNTER — Encounter: Payer: Medicare Other | Admitting: Occupational Therapy

## 2016-02-17 ENCOUNTER — Encounter: Payer: Medicare Other | Admitting: Occupational Therapy

## 2016-03-11 DIAGNOSIS — H2511 Age-related nuclear cataract, right eye: Secondary | ICD-10-CM | POA: Diagnosis not present

## 2016-04-26 DIAGNOSIS — H2511 Age-related nuclear cataract, right eye: Secondary | ICD-10-CM | POA: Diagnosis not present

## 2016-04-27 ENCOUNTER — Encounter: Payer: Self-pay | Admitting: *Deleted

## 2016-04-29 ENCOUNTER — Encounter: Payer: Self-pay | Admitting: Urology

## 2016-04-29 ENCOUNTER — Ambulatory Visit (INDEPENDENT_AMBULATORY_CARE_PROVIDER_SITE_OTHER): Payer: Medicare Other | Admitting: Urology

## 2016-04-29 VITALS — BP 164/70 | HR 83 | Ht 67.0 in | Wt 154.3 lb

## 2016-04-29 DIAGNOSIS — R351 Nocturia: Secondary | ICD-10-CM | POA: Insufficient documentation

## 2016-04-29 DIAGNOSIS — N138 Other obstructive and reflux uropathy: Secondary | ICD-10-CM

## 2016-04-29 DIAGNOSIS — R310 Gross hematuria: Secondary | ICD-10-CM | POA: Diagnosis not present

## 2016-04-29 DIAGNOSIS — N402 Nodular prostate without lower urinary tract symptoms: Secondary | ICD-10-CM | POA: Diagnosis not present

## 2016-04-29 DIAGNOSIS — N401 Enlarged prostate with lower urinary tract symptoms: Secondary | ICD-10-CM | POA: Diagnosis not present

## 2016-04-29 DIAGNOSIS — R350 Frequency of micturition: Secondary | ICD-10-CM

## 2016-04-29 LAB — URINALYSIS, COMPLETE
BILIRUBIN UA: NEGATIVE
GLUCOSE, UA: NEGATIVE
Leukocytes, UA: NEGATIVE
Nitrite, UA: NEGATIVE
PH UA: 5.5 (ref 5.0–7.5)
SPEC GRAV UA: 1.025 (ref 1.005–1.030)
UUROB: 0.2 mg/dL (ref 0.2–1.0)

## 2016-04-29 LAB — BLADDER SCAN AMB NON-IMAGING: SCAN RESULT: 64

## 2016-04-29 LAB — MICROSCOPIC EXAMINATION

## 2016-04-29 MED ORDER — TAMSULOSIN HCL 0.4 MG PO CAPS: 0.4000 mg | ORAL_CAPSULE | Freq: Every day | ORAL | Status: DC

## 2016-04-29 NOTE — Progress Notes (Signed)
Patient presents with history of gross hematuria he first noted 2 weeks ago with some blood per meatus and red spots on his shorts. This was associated with a short time of penile pain. His been told he had kidney stones but never seen a stone pass. I do not see any recent imaging. No exposure risk. Associated symptoms include nocturia 3-7 over the past 6 months and some occasional urgency and weak stream. He's been told he had BPH but no specific treatment. He has no lower extremity edema or obstructive sleep apnea.  Patient with chronic kidney disease, BUN of 32, creatinine 1.85. History of coronary artery disease status post CABG.  UA today shows a few bacteria, 0-5 white cells and greater than 30 red blood cells per high-powered field.  PVR 64 ml   Past medical history reviewed. Past surgical history reviewed. Family history reviewed. Social history reviewed. Medications reviewed. Allergies reviewed. Review of systems 13 system review of systems was obtained and was negative.  Of note the patient's wife is currently being treated for non-Hodgkin's lymphoma at the cancer center. Also his son is a Pension scheme manager in [place], [place]. The patient did NOT want me to contact his son to go over his situation.  Physical exam: Patient in no acute distress, elderly male Neurologic: No focal deficits GU: Penis circumcised without mass or lesion. Testicles descended bilaterally and palpably normal. Digital rectal exam: Prostate enlarged, 3+, left nodule  Assessment/plan:   #1 gross hematuria-discussed standard evaluation with CT scan with IV contrast and cystoscopy. Discussed nature risks and benefits of these procedures and he elects to proceed. I sent urine for culture as a precaution. BUN and creatinine sent. Patient may need a reduced dose contrast and this was documented on the order.  #2 BPH with lower urinary tract symptoms, nocturia-discussed the nature risk benefits and  alternatives to alpha-blocker's and will start patient on tamsulosin. Patient has a large prostate on exam.  #3 prostate nodule-patient has a concerning nodule in the left prostate for prostate cancer. Discussed the nature risk and benefits of PSA screening and a PSA was sent for prognostic information. Discussed with the patient at his age and with a prostate nodule prostate cancer is not uncommon. Depending on PSA levels this may be something we follow or we can discuss in the long run the utility of a prostate biopsy after gross hematuria evaluation with the CT scan and cystoscopy.

## 2016-04-30 LAB — PSA: Prostate Specific Ag, Serum: 146.8 ng/mL — ABNORMAL HIGH (ref 0.0–4.0)

## 2016-04-30 LAB — BUN+CREAT
BUN/Creatinine Ratio: 13 (ref 10–24)
BUN: 27 mg/dL (ref 8–27)
CREATININE: 2.06 mg/dL — AB (ref 0.76–1.27)
GFR calc Af Amer: 33 mL/min/{1.73_m2} — ABNORMAL LOW (ref >59)
GFR, EST NON AFRICAN AMERICAN: 29 mL/min/{1.73_m2} — AB (ref >59)

## 2016-05-01 LAB — URINE CULTURE

## 2016-05-02 ENCOUNTER — Telehealth: Payer: Self-pay

## 2016-05-02 NOTE — Telephone Encounter (Signed)
No VM set up.

## 2016-05-02 NOTE — Telephone Encounter (Signed)
-----   Message from Festus Aloe, MD sent at 05/02/2016 11:36 AM EDT ----- Notify patient his PSA is high at 146. We need to get CT and cystoscopy as planned and then consider prostate biopsy.

## 2016-05-03 ENCOUNTER — Ambulatory Visit
Admission: RE | Admit: 2016-05-03 | Discharge: 2016-05-03 | Disposition: A | Discharge status: Home / Self Care | Payer: Medicare Other | Source: Ambulatory Visit | Attending: Ophthalmology | Admitting: Ophthalmology

## 2016-05-03 ENCOUNTER — Encounter: Payer: Self-pay | Admitting: *Deleted

## 2016-05-03 ENCOUNTER — Encounter
Admission: RE | Disposition: A | Discharge status: Home / Self Care | Payer: Self-pay | Source: Ambulatory Visit | Attending: Ophthalmology

## 2016-05-03 DIAGNOSIS — Z5309 Procedure and treatment not carried out because of other contraindication: Secondary | ICD-10-CM | POA: Insufficient documentation

## 2016-05-03 SURGERY — PHACOEMULSIFICATION, CATARACT, WITH IOL INSERTION
Anesthesia: Choice | Laterality: Right

## 2016-05-03 SURGICAL SUPPLY — 21 items
CANNULA ANT/CHMB 27G (MISCELLANEOUS) ×1 IMPLANT
CANNULA ANT/CHMB 27GA (MISCELLANEOUS) ×3 IMPLANT
CUP MEDICINE 2OZ PLAST GRAD ST (MISCELLANEOUS) ×3 IMPLANT
GLOVE BIO SURGEON STRL SZ8 (GLOVE) ×3 IMPLANT
GLOVE BIOGEL M 6.5 STRL (GLOVE) ×3 IMPLANT
GLOVE SURG LX 8.0 MICRO (GLOVE) ×2
GLOVE SURG LX STRL 8.0 MICRO (GLOVE) ×1 IMPLANT
GOWN STRL REUS W/ TWL LRG LVL3 (GOWN DISPOSABLE) ×2 IMPLANT
GOWN STRL REUS W/TWL LRG LVL3 (GOWN DISPOSABLE) ×6
PACK CATARACT (MISCELLANEOUS) ×1 IMPLANT
PACK CATARACT BRASINGTON LX (MISCELLANEOUS) ×1 IMPLANT
PACK EYE AFTER SURG (MISCELLANEOUS) ×3 IMPLANT
SOL BSS BAG (MISCELLANEOUS) ×3
SOL PREP PVP 2OZ (MISCELLANEOUS) ×3
SOLUTION BSS BAG (MISCELLANEOUS) ×1 IMPLANT
SOLUTION PREP PVP 2OZ (MISCELLANEOUS) ×1 IMPLANT
SYR 3ML LL SCALE MARK (SYRINGE) ×3 IMPLANT
SYR 5ML LL (SYRINGE) ×3 IMPLANT
SYR TB 1ML 27GX1/2 LL (SYRINGE) ×3 IMPLANT
WATER STERILE IRR 1000ML POUR (IV SOLUTION) ×3 IMPLANT
WIPE NON LINTING 3.25X3.25 (MISCELLANEOUS) ×3 IMPLANT

## 2016-05-03 NOTE — Telephone Encounter (Signed)
Patient called to obtain results of his PSA.  I advised him of the result and shared Dr. Lyndal Rainbow advice to proceed with the CT scan and cystoscopy.  I also advised him to consider a prostate biopsy.  He stated that he will discuss with his family and call us back to schedule.

## 2016-05-03 NOTE — Progress Notes (Signed)
Anesthesia canceled due to eating cereal and milk this morning.

## 2016-05-09 ENCOUNTER — Encounter: Payer: Self-pay | Admitting: *Deleted

## 2016-05-10 ENCOUNTER — Encounter
Admission: RE | Disposition: A | Discharge status: Home / Self Care | Payer: Self-pay | Source: Ambulatory Visit | Attending: Ophthalmology

## 2016-05-10 ENCOUNTER — Encounter: Payer: Self-pay | Admitting: Anesthesiology

## 2016-05-10 ENCOUNTER — Ambulatory Visit: Payer: Medicare Other | Admitting: Anesthesiology

## 2016-05-10 ENCOUNTER — Ambulatory Visit
Admission: RE | Admit: 2016-05-10 | Discharge: 2016-05-10 | Disposition: A | Discharge status: Home / Self Care | Payer: Medicare Other | Source: Ambulatory Visit | Attending: Ophthalmology | Admitting: Ophthalmology

## 2016-05-10 DIAGNOSIS — I110 Hypertensive heart disease with heart failure: Secondary | ICD-10-CM | POA: Diagnosis not present

## 2016-05-10 DIAGNOSIS — Z8673 Personal history of transient ischemic attack (TIA), and cerebral infarction without residual deficits: Secondary | ICD-10-CM | POA: Insufficient documentation

## 2016-05-10 DIAGNOSIS — I509 Heart failure, unspecified: Secondary | ICD-10-CM | POA: Diagnosis not present

## 2016-05-10 DIAGNOSIS — I739 Peripheral vascular disease, unspecified: Secondary | ICD-10-CM | POA: Insufficient documentation

## 2016-05-10 DIAGNOSIS — H2511 Age-related nuclear cataract, right eye: Secondary | ICD-10-CM | POA: Insufficient documentation

## 2016-05-10 DIAGNOSIS — E7800 Pure hypercholesterolemia, unspecified: Secondary | ICD-10-CM | POA: Insufficient documentation

## 2016-05-10 DIAGNOSIS — Z951 Presence of aortocoronary bypass graft: Secondary | ICD-10-CM | POA: Insufficient documentation

## 2016-05-10 DIAGNOSIS — I10 Essential (primary) hypertension: Secondary | ICD-10-CM | POA: Insufficient documentation

## 2016-05-10 DIAGNOSIS — I2510 Atherosclerotic heart disease of native coronary artery without angina pectoris: Secondary | ICD-10-CM | POA: Diagnosis not present

## 2016-05-10 HISTORY — PX: CATARACT EXTRACTION W/PHACO: SHX586

## 2016-05-10 SURGERY — PHACOEMULSIFICATION, CATARACT, WITH IOL INSERTION
Anesthesia: Monitor Anesthesia Care | Site: Eye | Laterality: Right | Wound class: Clean

## 2016-05-10 MED ORDER — MIDAZOLAM HCL 2 MG/2ML IJ SOLN
INTRAMUSCULAR | Status: DC | PRN
  Administered 2016-05-10 (×2): 1 mg via INTRAVENOUS

## 2016-05-10 MED ORDER — MOXIFLOXACIN HCL 0.5 % OP SOLN: 1.0000 [drp] | OPHTHALMIC | Status: DC | PRN

## 2016-05-10 MED ORDER — MOXIFLOXACIN HCL 0.5 % OP SOLN
OPHTHALMIC | Status: AC
  Filled 2016-05-10: qty 3

## 2016-05-10 MED ORDER — POVIDONE-IODINE 5 % OP SOLN
OPHTHALMIC | Status: AC
  Filled 2016-05-10: qty 30

## 2016-05-10 MED ORDER — NA CHONDROIT SULF-NA HYALURON 40-17 MG/ML IO SOLN
INTRAOCULAR | Status: DC | PRN
  Administered 2016-05-10: 1 mL via INTRAOCULAR

## 2016-05-10 MED ORDER — EPINEPHRINE HCL 1 MG/ML IJ SOLN
INTRAOCULAR | Status: DC | PRN
  Administered 2016-05-10: 1 mL via OPHTHALMIC

## 2016-05-10 MED ORDER — TETRACAINE HCL 0.5 % OP SOLN
1.0000 [drp] | Freq: Once | OPHTHALMIC | Status: AC
  Administered 2016-05-10: 1 [drp] via OPHTHALMIC

## 2016-05-10 MED ORDER — ARMC OPHTHALMIC DILATING GEL
1.0000 "application " | OPHTHALMIC | Status: AC | PRN
  Administered 2016-05-10 (×2): 1 via OPHTHALMIC

## 2016-05-10 MED ORDER — POVIDONE-IODINE 5 % OP SOLN
1.0000 "application " | Freq: Once | OPHTHALMIC | Status: AC
  Administered 2016-05-10: 1 via OPHTHALMIC

## 2016-05-10 MED ORDER — NA CHONDROIT SULF-NA HYALURON 40-17 MG/ML IO SOLN
INTRAOCULAR | Status: AC
  Filled 2016-05-10: qty 1

## 2016-05-10 MED ORDER — ARMC OPHTHALMIC DILATING GEL
OPHTHALMIC | Status: AC
  Filled 2016-05-10: qty 0.25

## 2016-05-10 MED ORDER — EPINEPHRINE HCL 1 MG/ML IJ SOLN
INTRAMUSCULAR | Status: AC
  Filled 2016-05-10: qty 1

## 2016-05-10 MED ORDER — CEFUROXIME OPHTHALMIC INJECTION 1 MG/0.1 ML
INJECTION | OPHTHALMIC | Status: AC
  Filled 2016-05-10: qty 0.1

## 2016-05-10 MED ORDER — MOXIFLOXACIN HCL 0.5 % OP SOLN
OPHTHALMIC | Status: DC | PRN
  Administered 2016-05-10: 1 [drp] via OPHTHALMIC

## 2016-05-10 MED ORDER — SODIUM CHLORIDE 0.9 % IV SOLN
INTRAVENOUS | Status: DC
Start: 2016-05-10 — End: 2016-05-10
  Administered 2016-05-10 (×2): via INTRAVENOUS

## 2016-05-10 MED ORDER — TRYPAN BLUE 0.06 % OP SOLN
OPHTHALMIC | Status: AC
  Filled 2016-05-10: qty 0.5

## 2016-05-10 MED ORDER — TETRACAINE HCL 0.5 % OP SOLN
OPHTHALMIC | Status: AC
  Filled 2016-05-10: qty 2

## 2016-05-10 MED ORDER — CARBACHOL 0.01 % IO SOLN
INTRAOCULAR | Status: DC | PRN
  Administered 2016-05-10: .5 mL via INTRAOCULAR

## 2016-05-10 MED ORDER — CEFUROXIME OPHTHALMIC INJECTION 1 MG/0.1 ML
INJECTION | OPHTHALMIC | Status: DC | PRN
  Administered 2016-05-10: .1 mL via INTRACAMERAL

## 2016-05-10 SURGICAL SUPPLY — 22 items
CANNULA ANT/CHMB 27G (MISCELLANEOUS) ×1 IMPLANT
CANNULA ANT/CHMB 27GA (MISCELLANEOUS) ×3 IMPLANT
CUP MEDICINE 2OZ PLAST GRAD ST (MISCELLANEOUS) ×3 IMPLANT
GLOVE BIO SURGEON STRL SZ8 (GLOVE) ×3 IMPLANT
GLOVE BIOGEL M 6.5 STRL (GLOVE) ×3 IMPLANT
GLOVE SURG LX 8.0 MICRO (GLOVE) ×2
GLOVE SURG LX STRL 8.0 MICRO (GLOVE) ×1 IMPLANT
GOWN STRL REUS W/ TWL LRG LVL3 (GOWN DISPOSABLE) ×2 IMPLANT
GOWN STRL REUS W/TWL LRG LVL3 (GOWN DISPOSABLE) ×6
LENS IOL TECNIS ITEC 19.5 (Intraocular Lens) ×2 IMPLANT
PACK CATARACT (MISCELLANEOUS) ×3 IMPLANT
PACK CATARACT BRASINGTON LX (MISCELLANEOUS) ×3 IMPLANT
PACK EYE AFTER SURG (MISCELLANEOUS) ×3 IMPLANT
SOL BSS BAG (MISCELLANEOUS) ×3
SOL PREP PVP 2OZ (MISCELLANEOUS) ×3
SOLUTION BSS BAG (MISCELLANEOUS) ×1 IMPLANT
SOLUTION PREP PVP 2OZ (MISCELLANEOUS) ×1 IMPLANT
SYR 3ML LL SCALE MARK (SYRINGE) ×3 IMPLANT
SYR 5ML LL (SYRINGE) ×3 IMPLANT
SYR TB 1ML 27GX1/2 LL (SYRINGE) ×3 IMPLANT
WATER STERILE IRR 1000ML POUR (IV SOLUTION) ×3 IMPLANT
WIPE NON LINTING 3.25X3.25 (MISCELLANEOUS) ×3 IMPLANT

## 2016-05-10 NOTE — Anesthesia Postprocedure Evaluation (Signed)
Anesthesia Post Note  Patient: Jake Garcia  Procedure(s) Performed: Procedure(s) (LRB): CATARACT EXTRACTION PHACO AND INTRAOCULAR LENS PLACEMENT (IOC) (Right)  Patient location during evaluation: PACU Level of consciousness: awake and awake and alert Pain management: pain level controlled Vital Signs Assessment: post-procedure vital signs reviewed and stable Respiratory status: spontaneous breathing Cardiovascular status: blood pressure returned to baseline and stable Anesthetic complications: no    Last Vitals:  Filed Vitals:   05/10/16 0701  BP: 166/79  Pulse: 83  Temp: 36.2 C  Resp: 16    Last Pain: There were no vitals filed for this visit.               Shirlyn Savin C

## 2016-05-10 NOTE — Op Note (Signed)
PREOPERATIVE DIAGNOSIS:  Nuclear sclerotic cataract of the right eye.   POSTOPERATIVE DIAGNOSIS: nuclear sclerotic cataract right eye   OPERATIVE PROCEDURE:  Procedure(s): CATARACT EXTRACTION PHACO AND INTRAOCULAR LENS PLACEMENT (IOC)   SURGEON:  Birder Robson, MD.   ANESTHESIA:  Anesthesiologist: Gunnar Bulla, MD CRNA: Jennette Bill  1.      Managed anesthesia care. 2.      Topical tetracaine drops followed by 2% Xylocaine jelly applied in the preoperative holding area.   COMPLICATIONS:  None.   TECHNIQUE:   Stop and chop   DESCRIPTION OF PROCEDURE:  The patient was examined and consented in the preoperative holding area where the aforementioned topical anesthesia was applied to the right eye and then brought back to the Operating Room where the right eye was prepped and draped in the usual sterile ophthalmic fashion and a lid speculum was placed. A paracentesis was created with the side port blade and the anterior chamber was filled with viscoelastic. A near clear corneal incision was performed with the steel keratome. A continuous curvilinear capsulorrhexis was performed with a cystotome followed by the capsulorrhexis forceps. Hydrodissection and hydrodelineation were carried out with BSS on a blunt cannula. The lens was removed in a stop and chop  technique and the remaining cortical material was removed with the irrigation-aspiration handpiece. The capsular bag was inflated with viscoelastic and the Technis [id number]  lens was placed in the capsular bag without complication. The remaining viscoelastic was removed from the eye with the irrigation-aspiration handpiece. The wounds were hydrated. The anterior chamber was flushed with Miostat and the eye was inflated to physiologic pressure. 0.1 mL of cefuroxime concentration 10 mg/mL was placed in the anterior chamber. The wounds were found to be water tight. The eye was dressed with Vigamox. The patient was given protective glasses to wear  throughout the day and a shield with which to sleep tonight. The patient was also given drops with which to begin a drop regimen today and will follow-up with me in one day.  Implant Name Type Inv. Item Serial No. Manufacturer Lot No. LRB No. Used  LENS IOL DIOP 19.5 - [id number] Intraocular Lens LENS IOL DIOP 19.5 [id number]   Right 1   Procedure(s) with comments: CATARACT EXTRACTION PHACO AND INTRAOCULAR LENS PLACEMENT (IOC) (Right) - [place] 00:56 AP% 23.7 CDE 13.29 fluid pack lot # [id number]  Electronically signed: [place] 05/10/2016 8:41 AM

## 2016-05-10 NOTE — Discharge Instructions (Signed)
FOLLOW DR. PORFILIO'S POSTOP DISCHARGE INSTRUCTION SHEET AS REVIEWED  Eye Surgery Discharge Instructions  Expect mild scratchy sensation or mild soreness. DO NOT RUB YOUR EYE!  The day of surgery:  Minimal physical activity, but bed rest is not required  No reading, computer work, or close hand work  No bending, lifting, or straining.  May watch TV  For 24 hours:  No driving, legal decisions, or alcoholic beverages  Safety precautions  Eat anything you prefer: It is better to start with liquids, then soup then solid foods.  _____ Eye patch should be worn until postoperative exam tomorrow.  ____ Solar shield eyeglasses should be worn for comfort in the sunlight/patch while sleeping  Resume all regular medications including aspirin or Coumadin if these were discontinued prior to surgery. You may shower, bathe, shave, or wash your hair. Tylenol may be taken for mild discomfort.  Call your doctor if you experience significant pain, nausea, or vomiting, fever > 101 or other signs of infection. [phone] or [phone] Specific instructions:  Follow-up Information    Follow up with Tim Lair, MD.   Specialty:  Ophthalmology   Why:  05/11/16 @ 10:40 am   Contact information:   [place] [address] [phone]

## 2016-05-10 NOTE — Anesthesia Preprocedure Evaluation (Addendum)
Anesthesia Evaluation  Patient identified by MRN, date of birth, ID band Patient awake    Reviewed: Allergy & Precautions, NPO status , Patient's Chart, lab work & pertinent test results, reviewed documented beta blocker date and time   Airway Mallampati: II  TM Distance: >3 FB     Dental  (+) Chipped, Dental Advisory Given, Missing, Poor Dentition   Pulmonary           Cardiovascular hypertension, Pt. on medications + CAD, + CABG and + Peripheral Vascular Disease       Neuro/Psych CVA    GI/Hepatic   Endo/Other    Renal/GU      Musculoskeletal   Abdominal   Peds  Hematology   Anesthesia Other Findings Denies CVA. Has renal insufficiency.  Reproductive/Obstetrics                             Anesthesia Physical  Anesthesia Plan  ASA: III  Anesthesia Plan: MAC   Post-op Pain Management:    Induction:   Airway Management Planned:   Additional Equipment:   Intra-op Plan:   Post-operative Plan:   Informed Consent: I have reviewed the patients History and Physical, chart, labs and discussed the procedure including the risks, benefits and alternatives for the proposed anesthesia with the patient or authorized representative who has indicated his/her understanding and acceptance.     Plan Discussed with: CRNA  Anesthesia Plan Comments:         Anesthesia Quick Evaluation  

## 2016-05-10 NOTE — Transfer of Care (Signed)
Immediate Anesthesia Transfer of Care Note  Patient: Jake Garcia  Procedure(s) Performed: Procedure(s) with comments: CATARACT EXTRACTION PHACO AND INTRAOCULAR LENS PLACEMENT (IOC) (Right) - [place] 00:56 AP% 23.7 CDE 13.29 fluid pack lot # [id number]  Patient Location: PACU  Anesthesia Type:MAC  Level of Consciousness: awake, alert  and oriented  Airway & Oxygen Therapy: Patient Spontanous Breathing  Post-op Assessment: Report given to RN and Post -op Vital signs reviewed and stable  Post vital signs: Reviewed and stable  Last Vitals:  Filed Vitals:   05/10/16 0701  BP: 166/79  Pulse: 83  Temp: 36.2 C  Resp: 16    Last Pain: There were no vitals filed for this visit.       Complications: No apparent anesthesia complications

## 2016-05-10 NOTE — H&P (Signed)
  All labs reviewed. Abnormal studies sent to patients PCP when indicated.  Previous H&P reviewed, patient examined, there are NO CHANGES.  Jake Mesquita LOUIS5/23/20178:15 AM

## 2016-05-18 ENCOUNTER — Ambulatory Visit
Admission: RE | Admit: 2016-05-18 | Discharge: 2016-05-18 | Disposition: A | Discharge status: Home / Self Care | Payer: Medicare Other | Source: Ambulatory Visit | Attending: Urology | Admitting: Urology

## 2016-05-18 DIAGNOSIS — I2510 Atherosclerotic heart disease of native coronary artery without angina pectoris: Secondary | ICD-10-CM | POA: Diagnosis not present

## 2016-05-18 DIAGNOSIS — N402 Nodular prostate without lower urinary tract symptoms: Secondary | ICD-10-CM

## 2016-05-18 DIAGNOSIS — N200 Calculus of kidney: Secondary | ICD-10-CM | POA: Insufficient documentation

## 2016-05-18 DIAGNOSIS — K8020 Calculus of gallbladder without cholecystitis without obstruction: Secondary | ICD-10-CM | POA: Insufficient documentation

## 2016-05-18 DIAGNOSIS — N201 Calculus of ureter: Secondary | ICD-10-CM | POA: Insufficient documentation

## 2016-05-18 DIAGNOSIS — R310 Gross hematuria: Secondary | ICD-10-CM | POA: Diagnosis not present

## 2016-05-18 DIAGNOSIS — N202 Calculus of kidney with calculus of ureter: Secondary | ICD-10-CM | POA: Diagnosis not present

## 2016-05-23 ENCOUNTER — Ambulatory Visit (INDEPENDENT_AMBULATORY_CARE_PROVIDER_SITE_OTHER): Payer: Medicare Other | Admitting: Urology

## 2016-05-23 ENCOUNTER — Encounter: Payer: Self-pay | Admitting: Urology

## 2016-05-23 VITALS — BP 152/75 | HR 79 | Ht 65.5 in | Wt 153.0 lb

## 2016-05-23 DIAGNOSIS — N138 Other obstructive and reflux uropathy: Secondary | ICD-10-CM

## 2016-05-23 DIAGNOSIS — R972 Elevated prostate specific antigen [PSA]: Secondary | ICD-10-CM | POA: Diagnosis not present

## 2016-05-23 DIAGNOSIS — N401 Enlarged prostate with lower urinary tract symptoms: Secondary | ICD-10-CM | POA: Diagnosis not present

## 2016-05-23 DIAGNOSIS — N402 Nodular prostate without lower urinary tract symptoms: Secondary | ICD-10-CM

## 2016-05-23 DIAGNOSIS — N201 Calculus of ureter: Secondary | ICD-10-CM | POA: Diagnosis not present

## 2016-05-23 DIAGNOSIS — R310 Gross hematuria: Secondary | ICD-10-CM

## 2016-05-23 LAB — URINALYSIS, COMPLETE
BILIRUBIN UA: NEGATIVE
Glucose, UA: NEGATIVE
KETONES UA: NEGATIVE
LEUKOCYTES UA: NEGATIVE
Nitrite, UA: NEGATIVE
SPEC GRAV UA: 1.02 (ref 1.005–1.030)
UUROB: 0.2 mg/dL (ref 0.2–1.0)
pH, UA: 5 (ref 5.0–7.5)

## 2016-05-23 LAB — MICROSCOPIC EXAMINATION
Bacteria, UA: NONE SEEN
Epithelial Cells (non renal): NONE SEEN /hpf (ref 0–10)

## 2016-05-23 MED ORDER — LIDOCAINE HCL 2 % EX GEL
1.0000 "application " | Freq: Once | CUTANEOUS | Status: AC
  Administered 2016-05-23: 1 via URETHRAL

## 2016-05-23 MED ORDER — CIPROFLOXACIN HCL 500 MG PO TABS
500.0000 mg | ORAL_TABLET | Freq: Once | ORAL | Status: AC
  Administered 2016-05-23: 500 mg via ORAL

## 2016-05-23 NOTE — Progress Notes (Addendum)
Follow-up gross hematuria, prostate nodule, elevated PSA and ureteral stone-  #1 gross hematuria-patient underwent a noncontrast CT due to renal insufficiency which noted a right distal 4 mm ureteral stone, a nodular prostate and extensive pelvic lymphadenopathy (largest right external iliac up to 2.5 cm). He is here for for cystoscopy. Cystoscopy today was normal, see below.  #2 prostate nodule, elevated PSA-patient PSA found to be 147 in the setting of likely T2-T3 prostate cancer on exam. As above, CT shows a right distal 4 mm ureteral stone, a nodular prostate and extensive pelvic lymphadenopathy (largest right external iliac up to 2.5 cm).   #3 BPH with lower urinary tract symptoms-patient was started on tamsulosin. Symptoms improved.   #4 right distal ureteral stone-4 mm right distal stone noted on CT May 2017.  Today, patient seen for the above.   I called and had a long discussion with his son. His son is a Pension scheme manager in [place]. His card is on the board.  Procedure: Cystoscopy-the patient was placed supine, he was prepped and draped in the usual sterile fashion. He was covered with Cipro. The flexible cystoscope was inserted per urethra and the bladder inspected. Findings: The urethra appeared normal, the prostate is short and overall nonobstructed. The bladder mucosa appeared normal. There were no stones or foreign bodies in the bladder. The trigone and ureteral orifices were in their normal orthotopic position.  UA today shows 11-30 red cells, otherwise normal.  A/P:  #1 gross hematuria-likely from ureteral stone. Cystoscopy normal.  #2 prostate nodule, elevated PSA-patient PSA found to be 147 in the setting of likely T2-T3 prostate cancer on exam. As above, CT shows pelvic lymphadenopathy. He may only need 6 cores given the extensive nature of the disease. Can decide at biopsy with exam. I discussed the nature risks benefits and alternatives of prostate biopsy with  the patient and he elected to proceed.  I discussed with his son who is a Pension scheme manager as above. We'll plan bone scan, prostate biopsy and then ADT. May start with Firmagon to eliminate flare and for quick follow-up.  #3 BPH with lower urinary tract symptoms-patient was started on tamsulosin. He reports his symptoms are much improved. Again may be related to ureteral stone.  #4 right distal ureteral stone-4 mm right distal stone noted on CT May 2017. Patient has not seen a stone pass. Will check KUB.

## 2016-05-25 ENCOUNTER — Ambulatory Visit
Admission: RE | Admit: 2016-05-25 | Discharge: 2016-05-25 | Disposition: A | Discharge status: Home / Self Care | Payer: Medicare Other | Source: Ambulatory Visit | Attending: Urology | Admitting: Urology

## 2016-05-25 DIAGNOSIS — N201 Calculus of ureter: Secondary | ICD-10-CM

## 2016-05-25 DIAGNOSIS — N202 Calculus of kidney with calculus of ureter: Secondary | ICD-10-CM | POA: Insufficient documentation

## 2016-05-25 DIAGNOSIS — Z87442 Personal history of urinary calculi: Secondary | ICD-10-CM | POA: Diagnosis not present

## 2016-05-30 ENCOUNTER — Other Ambulatory Visit: Payer: Medicare Other

## 2016-05-31 ENCOUNTER — Telehealth: Payer: Self-pay | Admitting: Urology

## 2016-05-31 NOTE — Telephone Encounter (Signed)
Notify daughter (if she's on the release) we will observe the stones for now. I don't think they're causing a big problem.

## 2016-05-31 NOTE — Telephone Encounter (Signed)
Patient and his daughter are calling the office to obtain xray results and future plans for kidney stones.  Please advise.

## 2016-06-10 ENCOUNTER — Ambulatory Visit: Payer: Medicare Other | Admitting: Urology

## 2016-06-10 ENCOUNTER — Encounter: Payer: Self-pay | Admitting: Urology

## 2016-06-10 VITALS — BP 136/76 | HR 71 | Ht 67.0 in | Wt 151.1 lb

## 2016-06-10 DIAGNOSIS — N201 Calculus of ureter: Secondary | ICD-10-CM

## 2016-06-10 DIAGNOSIS — R972 Elevated prostate specific antigen [PSA]: Secondary | ICD-10-CM | POA: Diagnosis not present

## 2016-06-10 DIAGNOSIS — N402 Nodular prostate without lower urinary tract symptoms: Secondary | ICD-10-CM

## 2016-06-10 DIAGNOSIS — C61 Malignant neoplasm of prostate: Secondary | ICD-10-CM | POA: Diagnosis not present

## 2016-06-10 MED ORDER — LEVOFLOXACIN 500 MG PO TABS
500.0000 mg | ORAL_TABLET | Freq: Once | ORAL | Status: AC
  Administered 2016-06-10: 500 mg via ORAL

## 2016-06-10 MED ORDER — LIDOCAINE HCL 2 % EX GEL
1.0000 | Freq: Once | CUTANEOUS | Status: AC
Start: 2016-06-10 — End: 2016-06-10
  Administered 2016-06-10: 1 via URETHRAL

## 2016-06-10 NOTE — Progress Notes (Signed)
06/10/2016 1:49 PM   Jake Garcia December 22, 1928 [id number]  Referring provider: Morton Garcia., MD [place], [place] [address]  No chief complaint on file.   HPI: Mr Jake Garcia is here for prostate biopsy for a PSA of 147 and pelvic lymphadenopathy. He underwent KUB for right ureteral calculus last week which showed persistent calculus.     PMH: Past Medical History  Diagnosis Date  . Coronary artery disease   . Hyperlipidemia   . Hypertension     Surgical History: Past Surgical History  Procedure Laterality Date  . Coronary artery bypass graft  11/24/2008  . Cardiac catheterization  11/20/2008  . Kidney surgery      PARTIAL RESECTION  . Colon surgery      RESECTION  . Colonoscopy    . Cataract extraction w/phaco Right 05/10/2016    Procedure: CATARACT EXTRACTION PHACO AND INTRAOCULAR LENS PLACEMENT (IOC);  Surgeon: Jake Robson, MD;  Location: [facility] ORS;  Service: Ophthalmology;  Laterality: Right;  [place] 00:56 AP% 23.7 CDE 13.29 fluid pack lot # [id number]    Home Medications:    Medication List       This list is accurate as of: 06/10/16  1:49 PM.  Always use your most recent med list.               amLODipine 10 MG tablet  Commonly known as:  NORVASC  Take 1 tablet (10 mg total) by mouth daily.     aspirin 325 MG tablet  Take 1 tablet (325 mg total) by mouth daily.     BROMSITE 0.075 % Soln  Generic drug:  Bromfenac Sodium  Reported on 04/29/2016     DUREZOL 0.05 % Emul  Generic drug:  Difluprednate  Reported on 04/29/2016     rosuvastatin 10 MG tablet  Commonly known as:  CRESTOR  Take 1 tablet (10 mg total) by mouth daily.     tamsulosin 0.4 MG Caps capsule  Commonly known as:  FLOMAX  Take 1 capsule (0.4 mg total) by mouth daily after supper.        Allergies: No Known Allergies  Family History: Family History  Problem Relation Age of Onset  . Family history unknown: Yes    Social History:  reports that he has  never smoked. He does not have any smokeless tobacco history on file. He reports that he does not drink alcohol or use illicit drugs.  ROS:                                        Physical Exam: BP 136/76 mmHg  Pulse 71  Ht '5\' 7"'  (1.702 m)  Wt 68.539 kg (151 lb 1.6 oz)  BMI 23.66 kg/m2  Constitutional:  Alert and oriented, No acute distress. HEENT: [place] AT, moist mucus membranes.  Trachea midline, no masses. Cardiovascular: No clubbing, cyanosis, or edema. Respiratory: Normal respiratory effort, no increased work of breathing. GI: Abdomen is soft, nontender, nondistended, no abdominal masses GU: No CVA tenderness. Skin: No rashes, bruises or suspicious lesions. Lymph: No cervical or inguinal adenopathy. Neurologic: Grossly intact, no focal deficits, moving all 4 extremities. Psychiatric: Normal mood and affect.  Laboratory Data: Lab Results  Component Value Date   WBC 7.4 10/12/2015   HGB 10.2* 10/12/2015   HCT 31.8* 10/12/2015   MCV 62.0* 10/12/2015   PLT 269 10/12/2015  Lab Results  Component Value Date   CREATININE 2.06* 04/29/2016    No results found for: PSA  No results found for: TESTOSTERONE  Lab Results  Component Value Date   HGBA1C 5.8* 10/11/2015    Urinalysis    Component Value Date/Time   COLORURINE YELLOW 10/11/2015 0515   APPEARANCEUR Clear 05/23/2016 0935   APPEARANCEUR CLOUDY* 10/11/2015 0515   LABSPEC 1.012 10/11/2015 0515   PHURINE 7.5 10/11/2015 0515   GLUCOSEU Negative 05/23/2016 0935   HGBUR NEGATIVE 10/11/2015 0515   BILIRUBINUR Negative 05/23/2016 0935   BILIRUBINUR NEGATIVE 10/11/2015 0515   KETONESUR NEGATIVE 10/11/2015 0515   PROTEINUR 2+* 05/23/2016 0935   PROTEINUR 100* 10/11/2015 0515   UROBILINOGEN 0.2 10/11/2015 0515   NITRITE Negative 05/23/2016 0935   NITRITE NEGATIVE 10/11/2015 0515   LEUKOCYTESUR Negative 05/23/2016 0935   LEUKOCYTESUR NEGATIVE 10/11/2015 0515    Pertinent  Imaging: KUB  Assessment & Plan:    1. Elevated PSA/concern for prostate cancer -RTC 1 week for pathology discussion  2. Ureteral calculus - Continue MET - KUB in 1 week  There are no diagnoses linked to this encounter.  No Follow-up on file.  Jake Bang, MD  [facility] [address], [place], [place] [address] [phone]    Prostate Biopsy Procedure   Informed consent was obtained after discussing risks/benefits of the procedure.  A time out was performed to ensure correct patient identity.  Pre-Procedure: - Last PSA Level: No results found for: PSA - Gentamicin given prophylactically - Levaquin 500 mg administered PO -Transrectal Ultrasound performed revealing a 61 gm prostate -No significant hypoechoic or median lobe noted  Procedure: - Prostate block performed using 10 cc 1% lidocaine and biopsies taken from sextant areas, a total of 12 under ultrasound guidance.  Post-Procedure: - Patient tolerated the procedure well - He was counseled to seek immediate medical attention if experiences any severe pain, significant bleeding, or fevers - Return in one week to discuss biopsy results

## 2016-06-15 ENCOUNTER — Telehealth: Payer: Self-pay

## 2016-06-15 DIAGNOSIS — N400 Enlarged prostate without lower urinary tract symptoms: Secondary | ICD-10-CM

## 2016-06-15 MED ORDER — TAMSULOSIN HCL 0.4 MG PO CAPS: 0.4000 mg | ORAL_CAPSULE | Freq: Two times a day (BID) | ORAL | Status: DC

## 2016-06-15 NOTE — Telephone Encounter (Signed)
Patient stopped by office stating that he would like to increase his Tamsulosin to twice a day to help with his urinary symptoms, once daily is not giving him the results he would like. Per Dr. Alyson Ingles ok to send in a new script for twice daily, will re- evaluate symptoms at his upcoming follow up apt.  Patient notified of this.

## 2016-06-27 ENCOUNTER — Other Ambulatory Visit: Payer: Self-pay | Admitting: Urology

## 2016-06-28 ENCOUNTER — Ambulatory Visit (INDEPENDENT_AMBULATORY_CARE_PROVIDER_SITE_OTHER): Payer: Medicare Other | Admitting: Urology

## 2016-06-28 ENCOUNTER — Telehealth: Payer: Self-pay | Admitting: Urology

## 2016-06-28 VITALS — BP 163/68 | HR 73 | Wt 152.4 lb

## 2016-06-28 DIAGNOSIS — C61 Malignant neoplasm of prostate: Secondary | ICD-10-CM | POA: Insufficient documentation

## 2016-06-28 NOTE — Progress Notes (Signed)
06/28/2016 11:41 AM   Jake Garcia Apr 25, 1929 [id number]  Referring provider: Morton Garcia., MD [address] [place], [place] [address]  Chief Complaint  Patient presents with  . Biopsy    prostate biopsy     HPI: Jake Garcia is a 82yo here for followup for elevated PSA.  He underwent prostate biopsy which showed high volume Gleason 8 prostate cancer. He underwent CT scan which showed pelvic lymphadenopathy. PSA was 146.   No bone pain. He has moderate LUTS which is improved on flomax   PMH: Past Medical History  Diagnosis Date  . Coronary artery disease   . Hyperlipidemia   . Hypertension     Surgical History: Past Surgical History  Procedure Laterality Date  . Coronary artery bypass graft  11/24/2008  . Cardiac catheterization  11/20/2008  . Kidney surgery      PARTIAL RESECTION  . Colon surgery      RESECTION  . Colonoscopy    . Cataract extraction w/phaco Right 05/10/2016    Procedure: CATARACT EXTRACTION PHACO AND INTRAOCULAR LENS PLACEMENT (IOC);  Surgeon: Birder Robson, MD;  Location: [facility] ORS;  Service: Ophthalmology;  Laterality: Right;  [place] 00:56 AP% 23.7 CDE 13.29 fluid pack lot # [id number]    Home Medications:    Medication List       This list is accurate as of: 06/28/16 11:41 AM.  Always use your most recent med list.               amLODipine 10 MG tablet  Commonly known as:  NORVASC  Take 1 tablet (10 mg total) by mouth daily.     aspirin 325 MG tablet  Take 1 tablet (325 mg total) by mouth daily.     BROMSITE 0.075 % Soln  Generic drug:  Bromfenac Sodium  Reported on 06/28/2016     DUREZOL 0.05 % Emul  Generic drug:  Difluprednate  Reported on 06/28/2016     rosuvastatin 10 MG tablet  Commonly known as:  CRESTOR  Take 1 tablet (10 mg total) by mouth daily.     tamsulosin 0.4 MG Caps capsule  Commonly known as:  FLOMAX  Take 1 capsule (0.4 mg total) by mouth 2 (two) times daily.        Allergies: No Known  Allergies  Family History: Family History  Problem Relation Age of Onset  . Family history unknown: Yes    Social History:  reports that he has never smoked. He does not have any smokeless tobacco history on file. He reports that he does not drink alcohol or use illicit drugs.  ROS:                                        Physical Exam: BP 163/68 mmHg  Pulse 73  Wt 69.128 kg (152 lb 6.4 oz)  Constitutional:  Alert and oriented, No acute distress. HEENT: [place] AT, moist mucus membranes.  Trachea midline, no masses. Cardiovascular: No clubbing, cyanosis, or edema. Respiratory: Normal respiratory effort, no increased work of breathing. GI: Abdomen is soft, nontender, nondistended, no abdominal masses GU: No CVA tenderness.  Skin: No rashes, bruises or suspicious lesions. Lymph: No cervical or inguinal adenopathy. Neurologic: Grossly intact, no focal deficits, moving all 4 extremities. Psychiatric: Normal mood and affect.  Laboratory Data: Lab Results  Component Value Date   WBC 7.4 10/12/2015  HGB 10.2* 10/12/2015   HCT 31.8* 10/12/2015   MCV 62.0* 10/12/2015   PLT 269 10/12/2015    Lab Results  Component Value Date   CREATININE 2.06* 04/29/2016    No results found for: PSA  No results found for: TESTOSTERONE  Lab Results  Component Value Date   HGBA1C 5.8* 10/11/2015    Urinalysis    Component Value Date/Time   COLORURINE YELLOW 10/11/2015 0515   APPEARANCEUR Clear 05/23/2016 0935   APPEARANCEUR CLOUDY* 10/11/2015 0515   LABSPEC 1.012 10/11/2015 0515   PHURINE 7.5 10/11/2015 0515   GLUCOSEU Negative 05/23/2016 0935   HGBUR NEGATIVE 10/11/2015 0515   BILIRUBINUR Negative 05/23/2016 0935   BILIRUBINUR NEGATIVE 10/11/2015 0515   KETONESUR NEGATIVE 10/11/2015 0515   PROTEINUR 2+* 05/23/2016 0935   PROTEINUR 100* 10/11/2015 0515   UROBILINOGEN 0.2 10/11/2015 0515   NITRITE Negative 05/23/2016 0935   NITRITE NEGATIVE 10/11/2015 0515    LEUKOCYTESUR Negative 05/23/2016 0935   LEUKOCYTESUR NEGATIVE 10/11/2015 0515    Pertinent Imaging: CT scan   Assessment & Plan:   1. Metastatic prostate cancer -Bone Scan ASAP -RTC after bone scan  -Will start firmagon 240mg  in 1 week  There are no diagnoses linked to this encounter.  No Follow-up on file.  Nicolette Bang, MD  [facility] [address], [place], [place] [address] [phone]

## 2016-06-28 NOTE — Telephone Encounter (Signed)
Pt's daughter wants you to give her a call. [phone]  She has some questions for you.

## 2016-06-28 NOTE — Telephone Encounter (Signed)
I returned the pt daughter call, no answer.  LMOM to return my call.

## 2016-06-30 ENCOUNTER — Encounter
Admission: RE | Admit: 2016-06-30 | Discharge: 2016-06-30 | Disposition: A | Discharge status: Home / Self Care | Payer: Medicare Other | Source: Ambulatory Visit | Attending: Urology | Admitting: Urology

## 2016-06-30 ENCOUNTER — Ambulatory Visit
Admission: RE | Admit: 2016-06-30 | Discharge: 2016-06-30 | Disposition: A | Discharge status: Home / Self Care | Payer: Medicare Other | Source: Ambulatory Visit | Attending: Urology | Admitting: Urology

## 2016-06-30 DIAGNOSIS — R937 Abnormal findings on diagnostic imaging of other parts of musculoskeletal system: Secondary | ICD-10-CM | POA: Insufficient documentation

## 2016-06-30 DIAGNOSIS — C61 Malignant neoplasm of prostate: Secondary | ICD-10-CM | POA: Insufficient documentation

## 2016-06-30 MED ORDER — TECHNETIUM TC 99M MEDRONATE IV KIT
25.0000 | PACK | Freq: Once | INTRAVENOUS | Status: AC | PRN
  Administered 2016-06-30: 23.88 via INTRAVENOUS

## 2016-07-05 ENCOUNTER — Encounter: Payer: Self-pay | Admitting: Urology

## 2016-07-05 ENCOUNTER — Ambulatory Visit (INDEPENDENT_AMBULATORY_CARE_PROVIDER_SITE_OTHER): Payer: Medicare Other | Admitting: Urology

## 2016-07-05 VITALS — BP 154/74 | HR 80 | Ht 67.0 in | Wt 151.9 lb

## 2016-07-05 DIAGNOSIS — C61 Malignant neoplasm of prostate: Secondary | ICD-10-CM | POA: Diagnosis not present

## 2016-07-05 MED ORDER — DEGARELIX ACETATE 120 MG ~~LOC~~ SOLR
240.0000 mg | Freq: Once | SUBCUTANEOUS | Status: AC
  Administered 2016-07-05: 240 mg via SUBCUTANEOUS

## 2016-07-05 NOTE — Progress Notes (Signed)
82 year old male presents today for follow-up after undergoing a bone scan.  Gleason 4 +4=8 prostate cancer, PSA 146 at diagnosis CT scan demonstrates pelvic lymphadenopathy consistent with metastatic disease Bone scan demonstrates increased uptake in the right pubic rami consistent with bone metastasis. There is an area of increased uptake in the left foot as well.  The patient is taking tamsulosin 0.4 milligrams for his obstructive voiding symptoms, he has had significant improvement in his symptoms on this medication.  Interval: The patient is here today to discuss the bone scan results and to initiate therapy. He is present today with his daughter, we contacted his son who is a radiation oncologist via phone. The patient is doing quite well, he does have a little bit of swelling in his left foot which he just noted yesterday. Denies any significant pain or history of trauma in that area.  Current Outpatient Prescriptions on File Prior to Visit  Medication Sig Dispense Refill  . amLODipine (NORVASC) 10 MG tablet Take 1 tablet (10 mg total) by mouth daily. 90 tablet 3  . BROMSITE 0.075 % SOLN Reported on 06/28/2016    . DUREZOL 0.05 % EMUL Reported on 06/28/2016    . rosuvastatin (CRESTOR) 10 MG tablet Take 1 tablet (10 mg total) by mouth daily. 90 tablet 3  . tamsulosin (FLOMAX) 0.4 MG CAPS capsule Take 1 capsule (0.4 mg total) by mouth 2 (two) times daily. 180 capsule 3  . aspirin 325 MG tablet Take 1 tablet (325 mg total) by mouth daily. (Patient not taking: Reported on 07/05/2016) 30 tablet 0   No current facility-administered medications on file prior to visit.   Past Medical History  Diagnosis Date  . Coronary artery disease   . Hyperlipidemia   . Hypertension    Past Surgical History  Procedure Laterality Date  . Coronary artery bypass graft  11/24/2008  . Cardiac catheterization  11/20/2008  . Kidney surgery      PARTIAL RESECTION  . Colon surgery      RESECTION  . Colonoscopy     . Cataract extraction w/phaco Right 05/10/2016    Procedure: CATARACT EXTRACTION PHACO AND INTRAOCULAR LENS PLACEMENT (IOC);  Surgeon: Birder Robson, MD;  Location: [facility] ORS;  Service: Ophthalmology;  Laterality: Right;  [place] 00:56 AP% 23.7 CDE 13.29 fluid pack lot # [id number]   PE: Filed Vitals:   07/05/16 1328  BP: 154/74  Pulse: 80  NAD Poor dentention  Imaging: I have independently reviewed the patient's bone scan, showed the images to the family members and went over them in detail. The patient has a focal area in the right pubic rami consistent with a bone metastasis from his prostate cancer. The area in the left foot is unclear as to whether this may represent a metastasis or not.  Impression: High-grade metastatic prostate cancer, metastatic to both bone and pelvic/retroperitoneal lymph nodes.  Plan: Our plan is to initiate firming on the day. The patient was given 240 mg firming IM today. Our plan is to have the patient return back in 4 weeks for a 6 month Lupron injection. We will obtain a PSA at the time of that visit to ensure that his PSA and prostate cancer is responding to the treatment. I discussed side effects of androgen deprivation therapy with the patient. In particular, we discussed the risk of osteoporosis for which I recommended he take 1200 mg of calcium and 1000 international units of vitamin D 3 on a daily basis. We also discussed  the risk of hot flashes. The patient understands that in the beginning these may be difficult, they will likely improve over the course of the history of men. We also discussed the risk of gynecomastia, metabolic syndrome/weight gain, and increased fatigue.  We also discussed the role for Prolene in this setting. The patient would need dental clearance, and our discussion the patient has had difficult with his teeth in the past, he does not have a dentist currently, and it would seem exceedingly difficult for him to get cleared to begin  Prolia without extracting teeth in going through extensive dental work. The patient is not interested in this at this time.  Return to clinic in 1 month for Lupron injection and PSA/testosterone.

## 2016-07-08 ENCOUNTER — Ambulatory Visit: Payer: Medicare Other

## 2016-08-09 ENCOUNTER — Ambulatory Visit (INDEPENDENT_AMBULATORY_CARE_PROVIDER_SITE_OTHER): Payer: Medicare Other | Admitting: Urology

## 2016-08-09 VITALS — BP 147/56 | HR 76 | Ht 67.0 in | Wt 152.0 lb

## 2016-08-09 DIAGNOSIS — C61 Malignant neoplasm of prostate: Secondary | ICD-10-CM

## 2016-08-09 DIAGNOSIS — C775 Secondary and unspecified malignant neoplasm of intrapelvic lymph nodes: Secondary | ICD-10-CM | POA: Diagnosis not present

## 2016-08-09 MED ORDER — LEUPROLIDE ACETATE (6 MONTH) 45 MG IM KIT
45.0000 mg | PACK | Freq: Once | INTRAMUSCULAR | Status: AC
  Administered 2016-08-09: 45 mg via INTRAMUSCULAR

## 2016-08-09 NOTE — Progress Notes (Signed)
Lupron IM Injection   Due to Prostate Cancer patient is present today for a Lupron Injection.  Medication: Lupron 6 month Dose: 45 mg  Location: left upper outer buttocks Lot: [id number] Exp: 10/28/2017  Patient tolerated well, no complications were noted  Performed by: Fonnie Jarvis, CMA  Follow up: 76month

## 2016-08-09 NOTE — Progress Notes (Signed)
82 year old male presents today for follow-up after undergoing a bone scan.  Gleason 4 +4=8 prostate cancer, PSA 146 at diagnosis June 2017: CT scan demonstrates pelvic lymphadenopathy consistent with metastatic disease  Bone scan demonstrates increased uptake in the right pubic rami consistent with bone metastasis. There is an area of increased uptake in the left foot as well.  The patient is taking tamsulosin 0.4 milligrams for his obstructive voiding symptoms, he has had significant improvement in his symptoms on this medication.  ADT: July 18th 2017 -  Firmagon 240mg  IM  Aug 09, 2016 - Lupron 45mg  IM  PSA: Aug 22 - pending  Interval: Patient presents today for ongoing management of his metastatic prostate cancer. He is tolerated the Fergon injection well without any significant side effects. He is voiding symptoms have improved. He is now getting up less at night with a stronger stream.  Current Outpatient Prescriptions on File Prior to Visit  Medication Sig Dispense Refill  . amLODipine (NORVASC) 10 MG tablet Take 1 tablet (10 mg total) by mouth daily. 90 tablet 3  . aspirin 325 MG tablet Take 1 tablet (325 mg total) by mouth daily. 30 tablet 0  . rosuvastatin (CRESTOR) 10 MG tablet Take 1 tablet (10 mg total) by mouth daily. 90 tablet 3  . tamsulosin (FLOMAX) 0.4 MG CAPS capsule Take 1 capsule (0.4 mg total) by mouth 2 (two) times daily. 180 capsule 3   No current facility-administered medications on file prior to visit.    Past Medical History:  Diagnosis Date  . Coronary artery disease   . Hyperlipidemia   . Hypertension    Past Surgical History:  Procedure Laterality Date  . CARDIAC CATHETERIZATION  11/20/2008  . CATARACT EXTRACTION W/PHACO Right 05/10/2016   Procedure: CATARACT EXTRACTION PHACO AND INTRAOCULAR LENS PLACEMENT (IOC);  Surgeon: Birder Robson, MD;  Location: [facility] ORS;  Service: Ophthalmology;  Laterality: Right;  [place] 00:56 AP% 23.7 CDE 13.29 fluid pack  lot # [id number]  . COLON SURGERY     RESECTION  . COLONOSCOPY    . CORONARY ARTERY BYPASS GRAFT  11/24/2008  . KIDNEY SURGERY     PARTIAL RESECTION   PE: Vitals:   08/09/16 1349  BP: (!) 147/56  Pulse: 76  NAD Poor dentention  Imaging: I have independently reviewed the patient's bone scan, showed the images to the family members and went over them in detail. The patient has a focal area in the right pubic rami consistent with a bone metastasis from his prostate cancer. The area in the left foot is unclear as to whether this may represent a metastasis or not.  Impression: High-grade metastatic prostate cancer, metastatic to both bone and pelvic/retroperitoneal lymph nodes.  Plan:  Patient was given Lupron 45 mg IM today. We will plan to get a PSA today. We will repeat the PSA in 3 months (lab only), and then have the patient follow-up in 6 months with a PSA prior. I again went over side effect profile energy deprivation, especially Lupron. The patient understands the risk of osteoporosis and is already taking calcium and vitamin D supplements. He will contact us if he has any significant hot flashes, nausea, or breast but in. Otherwise, we'll plan to see the patient back as stated above.

## 2016-08-10 LAB — TESTOSTERONE: TESTOSTERONE: 22 ng/dL — AB (ref 264–916)

## 2016-08-10 LAB — PSA: PROSTATE SPECIFIC AG, SERUM: 12.8 ng/mL — AB (ref 0.0–4.0)

## 2016-08-12 ENCOUNTER — Telehealth: Payer: Self-pay

## 2016-08-12 NOTE — Telephone Encounter (Signed)
-----   Message from Ardis Hughs, MD sent at 08/11/2016  5:29 PM EDT ----- Please inform the patient that the PSA level is responding nicely, coming down as inspected  Testosterone level is appropriate. Thanks, [facility]

## 2016-08-12 NOTE — Telephone Encounter (Signed)
Spoke with pt in reference to PSA and testosterone results. Pt voiced understanding.

## 2016-10-19 ENCOUNTER — Ambulatory Visit (INDEPENDENT_AMBULATORY_CARE_PROVIDER_SITE_OTHER): Payer: Medicare Other | Admitting: Cardiovascular Disease

## 2016-10-19 ENCOUNTER — Encounter: Payer: Self-pay | Admitting: Cardiovascular Disease

## 2016-10-19 VITALS — BP 122/72 | HR 66 | Ht 66.5 in | Wt 152.0 lb

## 2016-10-19 DIAGNOSIS — I209 Angina pectoris, unspecified: Secondary | ICD-10-CM | POA: Diagnosis not present

## 2016-10-19 DIAGNOSIS — I6521 Occlusion and stenosis of right carotid artery: Secondary | ICD-10-CM

## 2016-10-19 DIAGNOSIS — I10 Essential (primary) hypertension: Secondary | ICD-10-CM | POA: Diagnosis not present

## 2016-10-19 DIAGNOSIS — I6330 Cerebral infarction due to thrombosis of unspecified cerebral artery: Secondary | ICD-10-CM

## 2016-10-19 DIAGNOSIS — I25119 Atherosclerotic heart disease of native coronary artery with unspecified angina pectoris: Secondary | ICD-10-CM

## 2016-10-19 DIAGNOSIS — I2510 Atherosclerotic heart disease of native coronary artery without angina pectoris: Secondary | ICD-10-CM

## 2016-10-19 DIAGNOSIS — E7800 Pure hypercholesterolemia, unspecified: Secondary | ICD-10-CM

## 2016-10-19 NOTE — Patient Instructions (Signed)
Medication Instructions:   No medication changes made  Labwork:  We will order liver and lipid panel at your convenience (fasting)  Testing/Procedures:  No further testing at this time   Follow-Up: It was a pleasure seeing you in the office today. Please call us if you have new issues that need to be addressed before your next appt.  [phone]  Your physician wants you to follow-up in: 12 months.  You will receive a reminder letter in the mail two months in advance. If you don't receive a letter, please call our office to schedule the follow-up appointment.  If you need a refill on your cardiac medications before your next appointment, please call your pharmacy.

## 2016-10-19 NOTE — Progress Notes (Signed)
Cardiology Office Note  Date:  10/19/2016   ID:  Jake Garcia, DOB December 25, 1928, MRN [id number]  PCP:  No PCP Per Patient   Chief Complaint  Patient presents with  . other    6 month follow up. Meds reviewed by the pt. verbally. "doing well."     HPI:  [name] is an 82 -year-old  male with a history of coronary artery disease, peripheral vascular disease,   bypass surgery in December 2009 at [facility], high-grade complex stenosis of the LAD with a LIMA to the LAD which was off-pump, hyperlipidemia, hypertension,  episode of chest pain August 2012 with cardiac catheter and stent placement.  history of medication noncompliance  He presents today for follow-up of his coronary artery disease Right carotid with less than 39% stenosis, October 2016 Suggested of high-grade right vertebral artery stenosis  In follow-up today, he reports that he is doing "fine" Denies any symptoms of chest discomfort concerning for angina Significant Stress, wife with cancer  Reports he also has diagnosis of prostate cancer  Started on  "some pill ",  PSA has dropped, most recently PSA 12.8, testosterone 22  Right arm, restricted movement, atrophy of the thenar muscles right hand  EKG on today's visit shows normal sinus rhythm with rate 65 bpm, no significant ST or T-wave changes  Other past medical history reviewed Previous hospital admission for possible stroke, found to have 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 carotid disease unchanged from prior studies,  echocardiogram with normal ejection fraction  seen by neurosurgery and recommended to participate in physical therapy Was discharged on a statin but did not fill this prescription   At the time of his cardiac catheterization, He had a 99% mid RCA lesion with stent placed also noted to have 25% left main, LIMA to the LAD was patent with minimal LAD disease, large ramus with 30% proximal disease, small to moderate OM 2  with 40% mid vessel disease, no LV gram performed. Stent was a Xience 3.5 x 23 mm DES stent.   echocardiogram in December 2009 showed mild LVH, diastolic dysfunction, mild TR, mildly elevated right ventricular systolic pressures.   PMH:   has a past medical history of Coronary artery disease; Hyperlipidemia; and Hypertension.  PSH:    Past Surgical History:  Procedure Laterality Date  . CARDIAC CATHETERIZATION  11/20/2008  . CATARACT EXTRACTION W/PHACO Right 05/10/2016   Procedure: CATARACT EXTRACTION PHACO AND INTRAOCULAR LENS PLACEMENT (IOC);  Surgeon: Birder Robson, MD;  Location: [facility] ORS;  Service: Ophthalmology;  Laterality: Right;  [place] 00:56 AP% 23.7 CDE 13.29 fluid pack lot # [id number]  . COLON SURGERY     RESECTION  . COLONOSCOPY    . CORONARY ARTERY BYPASS GRAFT  11/24/2008  . KIDNEY SURGERY     PARTIAL RESECTION    Current Outpatient Prescriptions  Medication Sig Dispense Refill  . amLODipine (NORVASC) 10 MG tablet Take 1 tablet (10 mg total) by mouth daily. 90 tablet 3  . aspirin 325 MG tablet Take 1 tablet (325 mg total) by mouth daily. 30 tablet 0  . rosuvastatin (CRESTOR) 10 MG tablet Take 1 tablet (10 mg total) by mouth daily. 90 tablet 3  . tamsulosin (FLOMAX) 0.4 MG CAPS capsule Take 1 capsule (0.4 mg total) by mouth 2 (two) times daily. 180 capsule 3   No current facility-administered medications for this visit.      Allergies:   Review of patient's allergies indicates no known allergies.  Social History:  The patient  reports that he has never smoked. He has never used smokeless tobacco. He reports that he does not drink alcohol or use drugs.   Family History:   Family history is unknown by patient.    Review of Systems: Review of Systems  Constitutional: Negative.   Respiratory: Negative.   Cardiovascular: Negative.   Gastrointestinal: Negative.   Musculoskeletal: Negative.   Neurological: Negative.   Psychiatric/Behavioral: Negative.   All  other systems reviewed and are negative.    PHYSICAL EXAM: VS:  BP 122/72 (BP Location: Left Arm, Patient Position: Sitting, Cuff Size: Normal)   Pulse 66   Ht 5' 6.5" (1.689 m)   Wt 152 lb (68.9 kg)   BMI 24.17 kg/m  , BMI Body mass index is 24.17 kg/m. GEN: Well nourished, well developed, in no acute distress  HEENT: normal  Neck: no JVD, carotid bruits, or masses Cardiac: RRR; no murmurs, rubs, or gallops,no edema  Respiratory:  clear to auscultation bilaterally, normal work of breathing GI: soft, nontender, nondistended, + BS MS: no deformity or atrophy , decreased range of motion of his right arm, unable to extend above his shoulder height, right hand weakness Skin: warm and dry, no rash Neuro:  Strength and sensation are intact Psych: euthymic mood, full affect    Recent Labs: 04/29/2016: BUN 27; Creatinine, Ser 2.06    Lipid Panel Lab Results  Component Value Date   CHOL 163 10/11/2015   HDL 33 (L) 10/11/2015   LDLCALC 118 (H) 10/11/2015   TRIG 59 10/11/2015      Wt Readings from Last 3 Encounters:  10/19/16 152 lb (68.9 kg)  08/09/16 152 lb (68.9 kg)  07/05/16 151 lb 14.4 oz (68.9 kg)       ASSESSMENT AND PLAN:  Arteriosclerosis of coronary artery - Plan: EKG 12-Lead, Lipid Profile, Hepatic function panel Currently with no symptoms of angina. No further workup at this time. Continue current medication regimen.  Coronary artery disease involving native coronary artery of native heart with angina pectoris ([place]) - Plan: EKG 12-Lead  Cerebral thrombosis with cerebral infarction - Plan: EKG 12-Lead  Essential hypertension - Plan: EKG 12-Lead Blood pressure is well controlled on today's visit. No changes made to the medications.  Pure hypercholesterolemia Tolerating Crestor 10 mg daily Reluctant in the past to change the dosing LDL slightly above goal. We have ordered repeat liver and lipid panel We will coordinate this when his comes to the cancer  center for lab work  Stenosis of right carotid artery Last evaluated October 2016 Right carotid with less than 39% stenosis, Suggested of high-grade right vertebral artery stenosis   Total encounter time more than 25 minutes  Greater than 50% was spent in counseling and coordination of care with the patient   Disposition:   F/U  6 months   Orders Placed This Encounter  Procedures  . Lipid Profile  . Hepatic function panel  . EKG 12-Lead     Signed, Esmond Plants, M.D., Ph.D. 10/19/2016  [place], [place]

## 2016-10-20 ENCOUNTER — Telehealth: Payer: Self-pay | Admitting: Cardiovascular Disease

## 2016-10-20 NOTE — Telephone Encounter (Signed)
l mom for pt to call back and schedule appt.  Pt needs fasting lab appointment

## 2016-10-20 NOTE — Telephone Encounter (Signed)
Pt states his wife is not doing well, and wants to hold off on getting his fasting labwork

## 2016-11-04 DIAGNOSIS — Z23 Encounter for immunization: Secondary | ICD-10-CM | POA: Diagnosis not present

## 2016-11-08 ENCOUNTER — Other Ambulatory Visit: Payer: Medicare Other

## 2016-11-08 ENCOUNTER — Encounter: Payer: Self-pay | Admitting: Urology

## 2016-11-17 ENCOUNTER — Other Ambulatory Visit: Payer: Medicare Other

## 2016-11-17 ENCOUNTER — Telehealth: Payer: Self-pay | Admitting: Urology

## 2016-11-17 DIAGNOSIS — C61 Malignant neoplasm of prostate: Secondary | ICD-10-CM | POA: Diagnosis not present

## 2016-11-17 NOTE — Telephone Encounter (Signed)
Patient had labs drawn today, 11/17/16.  He is requesting a copy of his lab results to be mailed to him.

## 2016-11-17 NOTE — Telephone Encounter (Signed)
Will mail out Monday.

## 2016-11-18 LAB — PSA: PROSTATE SPECIFIC AG, SERUM: 1.8 ng/mL (ref 0.0–4.0)

## 2016-11-21 NOTE — Telephone Encounter (Signed)
Results mailed 

## 2016-11-22 ENCOUNTER — Telehealth: Payer: Self-pay

## 2016-11-22 NOTE — Telephone Encounter (Signed)
Spoke with pt in reference to PSA results. Pt voiced understanding.  

## 2016-11-22 NOTE — Telephone Encounter (Signed)
-----   Message from Ardis Hughs, MD sent at 11/21/2016  4:31 PM EST ----- Please call the patient and let him know that his PSA has come down to 1.8.  This is excellent news. Dr. Lemmie Evens

## 2016-12-16 DIAGNOSIS — H2512 Age-related nuclear cataract, left eye: Secondary | ICD-10-CM | POA: Diagnosis not present

## 2016-12-17 ENCOUNTER — Other Ambulatory Visit: Payer: Self-pay | Admitting: Cardiovascular Disease

## 2016-12-20 ENCOUNTER — Telehealth: Payer: Self-pay | Admitting: Cardiovascular Disease

## 2016-12-20 NOTE — Telephone Encounter (Signed)
Rx sent today @ 7:30 am to [facility] for Crestor and amlodipine.

## 2016-12-20 NOTE — Telephone Encounter (Signed)
*  STAT* If patient is at the pharmacy, call can be transferred to refill team.   1. Which medications need to be refilled? (please list name of each medication and dose if known)amLODipine (NORVASC) 10 MG tablet and  rosuvastatin (CRESTOR) 10 MG tablet  2. Which pharmacy/location (including street and city if local pharmacy) is medication to be sent to? [place]  3. Do they need a 30 day or 90 day supply? 90 day

## 2017-01-04 DIAGNOSIS — H2512 Age-related nuclear cataract, left eye: Secondary | ICD-10-CM | POA: Diagnosis not present

## 2017-01-10 ENCOUNTER — Encounter: Payer: Self-pay | Admitting: *Deleted

## 2017-01-10 ENCOUNTER — Ambulatory Visit: Payer: Medicare Other | Admitting: Anesthesiology

## 2017-01-10 ENCOUNTER — Encounter
Admission: RE | Disposition: A | Discharge status: Home / Self Care | Payer: Self-pay | Source: Ambulatory Visit | Attending: Ophthalmology

## 2017-01-10 ENCOUNTER — Ambulatory Visit
Admission: RE | Admit: 2017-01-10 | Discharge: 2017-01-10 | Disposition: A | Discharge status: Home / Self Care | Payer: Medicare Other | Source: Ambulatory Visit | Attending: Ophthalmology | Admitting: Ophthalmology

## 2017-01-10 DIAGNOSIS — I10 Essential (primary) hypertension: Secondary | ICD-10-CM | POA: Insufficient documentation

## 2017-01-10 DIAGNOSIS — Z905 Acquired absence of kidney: Secondary | ICD-10-CM | POA: Insufficient documentation

## 2017-01-10 DIAGNOSIS — H2512 Age-related nuclear cataract, left eye: Secondary | ICD-10-CM | POA: Insufficient documentation

## 2017-01-10 DIAGNOSIS — Z8673 Personal history of transient ischemic attack (TIA), and cerebral infarction without residual deficits: Secondary | ICD-10-CM | POA: Insufficient documentation

## 2017-01-10 DIAGNOSIS — I2510 Atherosclerotic heart disease of native coronary artery without angina pectoris: Secondary | ICD-10-CM | POA: Insufficient documentation

## 2017-01-10 DIAGNOSIS — Z79899 Other long term (current) drug therapy: Secondary | ICD-10-CM | POA: Diagnosis not present

## 2017-01-10 DIAGNOSIS — E7800 Pure hypercholesterolemia, unspecified: Secondary | ICD-10-CM | POA: Diagnosis not present

## 2017-01-10 DIAGNOSIS — Z951 Presence of aortocoronary bypass graft: Secondary | ICD-10-CM | POA: Insufficient documentation

## 2017-01-10 DIAGNOSIS — I739 Peripheral vascular disease, unspecified: Secondary | ICD-10-CM | POA: Diagnosis not present

## 2017-01-10 DIAGNOSIS — H9190 Unspecified hearing loss, unspecified ear: Secondary | ICD-10-CM | POA: Diagnosis not present

## 2017-01-10 DIAGNOSIS — I25810 Atherosclerosis of coronary artery bypass graft(s) without angina pectoris: Secondary | ICD-10-CM | POA: Diagnosis not present

## 2017-01-10 DIAGNOSIS — H59022 Cataract (lens) fragments in eye following cataract surgery, left eye: Secondary | ICD-10-CM | POA: Diagnosis not present

## 2017-01-10 HISTORY — PX: CATARACT EXTRACTION W/PHACO: SHX586

## 2017-01-10 SURGERY — PHACOEMULSIFICATION, CATARACT, WITH IOL INSERTION
Anesthesia: Monitor Anesthesia Care | Site: Eye | Laterality: Left | Wound class: Clean

## 2017-01-10 MED ORDER — NA CHONDROIT SULF-NA HYALURON 40-17 MG/ML IO SOLN
INTRAOCULAR | Status: AC
  Filled 2017-01-10: qty 1

## 2017-01-10 MED ORDER — EPINEPHRINE PF 1 MG/ML IJ SOLN
INTRAMUSCULAR | Status: AC
  Filled 2017-01-10: qty 2

## 2017-01-10 MED ORDER — POVIDONE-IODINE 5 % OP SOLN
OPHTHALMIC | Status: DC | PRN
  Administered 2017-01-10: 1 via OPHTHALMIC

## 2017-01-10 MED ORDER — NA CHONDROIT SULF-NA HYALURON 40-17 MG/ML IO SOLN
INTRAOCULAR | Status: DC | PRN
  Administered 2017-01-10: 1 mL via INTRAOCULAR

## 2017-01-10 MED ORDER — MIDAZOLAM HCL 5 MG/5ML IJ SOLN
INTRAMUSCULAR | Status: AC
  Filled 2017-01-10: qty 5

## 2017-01-10 MED ORDER — ARMC OPHTHALMIC DILATING DROPS
1.0000 | OPHTHALMIC | Status: AC | PRN
Start: 2017-01-10 — End: 2017-01-11
  Administered 2017-01-10 (×3): 1 via OPHTHALMIC

## 2017-01-10 MED ORDER — EPINEPHRINE PF 1 MG/ML IJ SOLN
INTRAMUSCULAR | Status: DC | PRN
  Administered 2017-01-10: 08:00:00 via OPHTHALMIC

## 2017-01-10 MED ORDER — POVIDONE-IODINE 5 % OP SOLN
OPHTHALMIC | Status: AC
  Filled 2017-01-10: qty 30

## 2017-01-10 MED ORDER — SODIUM CHLORIDE 0.9 % IV SOLN
INTRAVENOUS | Status: DC
  Administered 2017-01-10: 08:00:00 via INTRAVENOUS

## 2017-01-10 MED ORDER — LIDOCAINE HCL (PF) 4 % IJ SOLN
INTRAMUSCULAR | Status: AC
Start: 2017-01-10 — End: 2017-01-10
  Filled 2017-01-10: qty 5

## 2017-01-10 MED ORDER — CARBACHOL 0.01 % IO SOLN
INTRAOCULAR | Status: DC | PRN
  Administered 2017-01-10: 0.5 mL via INTRAOCULAR

## 2017-01-10 MED ORDER — ARMC OPHTHALMIC DILATING DROPS
OPHTHALMIC | Status: AC
  Filled 2017-01-10: qty 0.4

## 2017-01-10 MED ORDER — MOXIFLOXACIN HCL 0.5 % OP SOLN
OPHTHALMIC | Status: DC | PRN
  Administered 2017-01-10: 1 [drp] via OPHTHALMIC

## 2017-01-10 MED ORDER — MOXIFLOXACIN HCL 0.5 % OP SOLN
1.0000 [drp] | Freq: Once | OPHTHALMIC | Status: AC
  Administered 2017-01-10: 1 [drp] via OPHTHALMIC

## 2017-01-10 MED ORDER — LIDOCAINE HCL (PF) 4 % IJ SOLN
INTRAOCULAR | Status: DC | PRN
  Administered 2017-01-10: 4 mL via OPHTHALMIC

## 2017-01-10 MED ORDER — TAPENTADOL HCL 50 MG PO TABS: 50.0000 mg | ORAL_TABLET | Freq: Once | ORAL | Status: DC

## 2017-01-10 MED ORDER — MOXIFLOXACIN HCL 0.5 % OP SOLN
OPHTHALMIC | Status: AC
  Administered 2017-01-10: 08:00:00
  Filled 2017-01-10: qty 3

## 2017-01-10 MED ORDER — MIDAZOLAM HCL 2 MG/2ML IJ SOLN
INTRAMUSCULAR | Status: DC | PRN
  Administered 2017-01-10: 2 mg via INTRAVENOUS

## 2017-01-10 SURGICAL SUPPLY — 22 items
CANNULA ANT/CHMB 27G (MISCELLANEOUS) ×1 IMPLANT
CANNULA ANT/CHMB 27GA (MISCELLANEOUS) ×3 IMPLANT
CUP MEDICINE 2OZ PLAST GRAD ST (MISCELLANEOUS) ×3 IMPLANT
GLOVE BIO SURGEON STRL SZ8 (GLOVE) ×3 IMPLANT
GLOVE BIOGEL M 6.5 STRL (GLOVE) ×3 IMPLANT
GLOVE SURG LX 8.0 MICRO (GLOVE) ×2
GLOVE SURG LX STRL 8.0 MICRO (GLOVE) ×1 IMPLANT
GOWN STRL REUS W/ TWL LRG LVL3 (GOWN DISPOSABLE) ×2 IMPLANT
GOWN STRL REUS W/TWL LRG LVL3 (GOWN DISPOSABLE) ×6
LENS IOL TECNIS ITEC 20.0 (Intraocular Lens) ×2 IMPLANT
PACK CATARACT (MISCELLANEOUS) ×3 IMPLANT
PACK CATARACT BRASINGTON LX (MISCELLANEOUS) ×3 IMPLANT
PACK EYE AFTER SURG (MISCELLANEOUS) ×3 IMPLANT
SOL BSS BAG (MISCELLANEOUS) ×3
SOL PREP PVP 2OZ (MISCELLANEOUS) ×3
SOLUTION BSS BAG (MISCELLANEOUS) ×1 IMPLANT
SOLUTION PREP PVP 2OZ (MISCELLANEOUS) ×1 IMPLANT
SYR 3ML LL SCALE MARK (SYRINGE) ×3 IMPLANT
SYR 5ML LL (SYRINGE) ×3 IMPLANT
SYR TB 1ML 27GX1/2 LL (SYRINGE) ×3 IMPLANT
WATER STERILE IRR 250ML POUR (IV SOLUTION) ×3 IMPLANT
WIPE NON LINTING 3.25X3.25 (MISCELLANEOUS) ×3 IMPLANT

## 2017-01-10 NOTE — Anesthesia Post-op Follow-up Note (Cosign Needed)
Anesthesia QCDR form completed.        

## 2017-01-10 NOTE — Discharge Instructions (Signed)
Eye Surgery Discharge Instructions  Expect mild scratchy sensation or mild soreness. DO NOT RUB YOUR EYE!  The day of surgery:  Minimal physical activity, but bed rest is not required  No reading, computer work, or close hand work  No bending, lifting, or straining.  May watch TV  For 24 hours:  No driving, legal decisions, or alcoholic beverages  Safety precautions  Eat anything you prefer: It is better to start with liquids, then soup then solid foods.  _____ Eye patch should be worn until postoperative exam tomorrow.  ____ Solar shield eyeglasses should be worn for comfort in the sunlight/patch while sleeping  Resume all regular medications including aspirin or Coumadin if these were discontinued prior to surgery. You may shower, bathe, shave, or wash your hair. Tylenol may be taken for mild discomfort.  Call your doctor if you experience significant pain, nausea, or vomiting, fever > 101 or other signs of infection. [phone] or [phone] Specific instructions:  Follow-up Information    Jake Garcia,Jake LOUIS, MD Follow up.   Specialty:  Ophthalmology Why:  January 24 at 10:45am Contact information: [address] [place] [address] [phone]

## 2017-01-10 NOTE — H&P (Signed)
All labs reviewed. Abnormal studies sent to patients PCP when indicated.  Previous H&P reviewed, patient examined, there are NO CHANGES.  Jake Paget LOUIS1/23/20188:06 AM

## 2017-01-10 NOTE — Anesthesia Preprocedure Evaluation (Signed)
Anesthesia Evaluation  Patient identified by MRN, date of birth, ID band Patient awake    Reviewed: Allergy & Precautions, NPO status , Patient's Chart, lab work & pertinent test results, reviewed documented beta blocker date and time   Airway Mallampati: II  TM Distance: >3 FB     Dental  (+) Chipped, Dental Advisory Given, Missing, Poor Dentition   Pulmonary           Cardiovascular hypertension, Pt. on medications + CAD, + CABG and + Peripheral Vascular Disease       Neuro/Psych CVA    GI/Hepatic   Endo/Other    Renal/GU      Musculoskeletal   Abdominal   Peds  Hematology   Anesthesia Other Findings Denies CVA. Has renal insufficiency.  Reproductive/Obstetrics                             Anesthesia Physical  Anesthesia Plan  ASA: III  Anesthesia Plan: MAC   Post-op Pain Management:    Induction:   Airway Management Planned:   Additional Equipment:   Intra-op Plan:   Post-operative Plan:   Informed Consent: I have reviewed the patients History and Physical, chart, labs and discussed the procedure including the risks, benefits and alternatives for the proposed anesthesia with the patient or authorized representative who has indicated his/her understanding and acceptance.     Plan Discussed with: CRNA  Anesthesia Plan Comments:         Anesthesia Quick Evaluation

## 2017-01-10 NOTE — Transfer of Care (Signed)
Immediate Anesthesia Transfer of Care Note  Patient: Jake Garcia  Procedure(s) Performed: Procedure(s) with comments: CATARACT EXTRACTION PHACO AND INTRAOCULAR LENS PLACEMENT (IOC) (Left) - [place] 1:01 AP% 17.5 CDE 10.72 Fluid pack lot # [id number]  Patient Location: PACU  Anesthesia Type:MAC  Level of Consciousness: awake  Airway & Oxygen Therapy: Patient Spontanous Breathing  Post-op Assessment: Report given to RN  Post vital signs: stable  Last Vitals:  Vitals:   01/10/17 0723  BP: (!) 146/81  Pulse: 79  Resp: 16  Temp: 36.7 C    Last Pain:  Vitals:   01/10/17 0723  TempSrc: Oral         Complications: No apparent anesthesia complications

## 2017-01-10 NOTE — Op Note (Signed)
PREOPERATIVE DIAGNOSIS:  Nuclear sclerotic cataract of the left eye.   POSTOPERATIVE DIAGNOSIS:  Nuclear sclerotic cataract of the left eye.   OPERATIVE PROCEDURE: Procedure(s): CATARACT EXTRACTION PHACO AND INTRAOCULAR LENS PLACEMENT (IOC)   SURGEON:  Birder Robson, MD.   ANESTHESIA:  Anesthesiologist: Martha Clan, MD CRNA: Leander Rams, CRNA  1.      Managed anesthesia care. 2.     0.71ml of Shugarcaine was instilled following the paracentesis   COMPLICATIONS:  None.   TECHNIQUE:   Stop and chop   DESCRIPTION OF PROCEDURE:  The patient was examined and consented in the preoperative holding area where the aforementioned topical anesthesia was applied to the left eye and then brought back to the Operating Room where the left eye was prepped and draped in the usual sterile ophthalmic fashion and a lid speculum was placed. A paracentesis was created with the side port blade and the anterior chamber was filled with viscoelastic. A near clear corneal incision was performed with the steel keratome. A continuous curvilinear capsulorrhexis was performed with a cystotome followed by the capsulorrhexis forceps. Hydrodissection and hydrodelineation were carried out with BSS on a blunt cannula. The lens was removed in a stop and chop  technique and the remaining cortical material was removed with the irrigation-aspiration handpiece. The capsular bag was inflated with viscoelastic and the Technis [id number] lens was placed in the capsular bag without complication. The remaining viscoelastic was removed from the eye with the irrigation-aspiration handpiece. The wounds were hydrated. The anterior chamber was flushed with Miostat and the eye was inflated to physiologic pressure. 0.52ml Vigamox was placed in the anterior chamber. The wounds were found to be water tight. The eye was dressed with Vigamox. The patient was given protective glasses to wear throughout the day and a shield with which to sleep tonight.  The patient was also given drops with which to begin a drop regimen today and will follow-up with me in one day.  Implant Name Type Inv. Item Serial No. Manufacturer Lot No. LRB No. Used  LENS IOL DIOP 20.0 - [id number] Intraocular Lens LENS IOL DIOP 20.0 [id number] AMO   Left 1    Procedure(s) with comments: CATARACT EXTRACTION PHACO AND INTRAOCULAR LENS PLACEMENT (IOC) (Left) - [place] 1:01 AP% 17.5 CDE 10.72 Fluid pack lot # [id number]  Electronically signed: [place] 01/10/2017 8:33 AM

## 2017-01-10 NOTE — Anesthesia Postprocedure Evaluation (Signed)
Anesthesia Post Note  Patient: Jake Garcia  Procedure(s) Performed: Procedure(s) (LRB): CATARACT EXTRACTION PHACO AND INTRAOCULAR LENS PLACEMENT (IOC) (Left)  Patient location during evaluation: PACU Anesthesia Type: MAC Level of consciousness: awake and alert Pain management: pain level controlled Vital Signs Assessment: post-procedure vital signs reviewed and stable Respiratory status: spontaneous breathing, nonlabored ventilation, respiratory function stable and patient connected to nasal cannula oxygen Cardiovascular status: stable and blood pressure returned to baseline Anesthetic complications: no     Last Vitals:  Vitals:   01/10/17 0723 01/10/17 0829  BP: (!) 146/81 (!) 147/57  Pulse: 79 60  Resp: 16 16  Temp: 36.7 C 36.6 C    Last Pain:  Vitals:   01/10/17 0723  TempSrc: Oral                 Martha Clan

## 2017-01-10 NOTE — Addendum Note (Signed)
Addendum  created 01/10/17 1151 by Leander Rams, CRNA   Charge Capture section accepted

## 2017-02-01 ENCOUNTER — Other Ambulatory Visit: Payer: Self-pay

## 2017-02-01 DIAGNOSIS — C61 Malignant neoplasm of prostate: Secondary | ICD-10-CM

## 2017-02-02 ENCOUNTER — Other Ambulatory Visit: Payer: Medicare Other

## 2017-02-02 DIAGNOSIS — C61 Malignant neoplasm of prostate: Secondary | ICD-10-CM

## 2017-02-03 LAB — PSA: Prostate Specific Ag, Serum: 1.4 ng/mL (ref 0.0–4.0)

## 2017-02-08 ENCOUNTER — Telehealth: Payer: Self-pay

## 2017-02-08 NOTE — Telephone Encounter (Signed)
Ardis Hughs, MD  Lestine Box, LPN        Please let patient know that his PSA is stable.  Thanks,  [employer] with pt in reference to PSA results. Pt voiced understanding.

## 2017-02-09 ENCOUNTER — Ambulatory Visit: Payer: Medicare Other

## 2017-02-14 ENCOUNTER — Ambulatory Visit (INDEPENDENT_AMBULATORY_CARE_PROVIDER_SITE_OTHER): Payer: Medicare Other | Admitting: Urology

## 2017-02-14 ENCOUNTER — Encounter: Payer: Self-pay | Admitting: Urology

## 2017-02-14 VITALS — BP 175/80 | HR 94 | Ht 66.0 in | Wt 157.2 lb

## 2017-02-14 DIAGNOSIS — C61 Malignant neoplasm of prostate: Secondary | ICD-10-CM

## 2017-02-14 MED ORDER — LEUPROLIDE ACETATE (6 MONTH) 45 MG IM KIT
45.0000 mg | PACK | Freq: Once | INTRAMUSCULAR | Status: AC
  Administered 2017-02-14: 45 mg via INTRAMUSCULAR

## 2017-02-14 NOTE — Progress Notes (Signed)
Lupron IM Injection   Due to Prostate Cancer patient is present today for a Lupron Injection.  Medication: Lupron 6 month Dose: 45 mg  Location: left upper outer buttocks Lot: [id number] Exp: 05/21/2018  Patient tolerated well, no complications were noted  Performed by: K.Russel,CMA  Follow up: 41mth

## 2017-02-14 NOTE — Progress Notes (Signed)
83 year old male presents today for follow-up after undergoing a bone scan.  Gleason 4 +4=8 prostate cancer, PSA 146 at diagnosis  June 2017: CT scan demonstrates pelvic lymphadenopathy consistent with metastatic disease  Bone scan demonstrates increased uptake in the right pubic rami consistent with bone metastasis. There is an area of increased uptake in the left foot as well.  The patient is taking tamsulosin 0.4 milligrams for his obstructive voiding symptoms, he has had significant improvement in his symptoms on this medication.  ADT: July 18th 2017 -  Firmagon 240mg  IM  Aug 09, 2016 - Lupron 45mg  IM Feb 14, 2017 - Lupron 45mg  IM  PSA: Aug 22 - 12.8 10/2016 - 1.8 01/2017 - 1.4  Interval: Patient presents today for ongoing management of his metastatic prostate cancer. He is tolerated the ADT well without any significant side effects. He is voiding symptoms have improved. He is now getting up less at night with a stronger stream.  He continues to get up ~3 x/night.  Sadly, his wife just recently passed away.  He is in the midst of grieving.     Current Outpatient Prescriptions on File Prior to Visit  Medication Sig Dispense Refill  . amLODipine (NORVASC) 10 MG tablet TAKE ONE TABLET BY MOUTH ONCE DAILY (Patient taking differently: Take 10mg s daily in the evening) 90 tablet 3  . rosuvastatin (CRESTOR) 10 MG tablet TAKE ONE TABLET BY MOUTH ONCE DAILY (Patient taking differently: Take 10mg s daily in the evening) 90 tablet 3  . tamsulosin (FLOMAX) 0.4 MG CAPS capsule Take 1 capsule (0.4 mg total) by mouth 2 (two) times daily. 180 capsule 3  . aspirin 325 MG tablet Take 1 tablet (325 mg total) by mouth daily. (Patient not taking: Reported on 02/14/2017) 30 tablet 0   No current facility-administered medications on file prior to visit.    Past Medical History:  Diagnosis Date  . Coronary artery disease   . Hyperlipidemia   . Hypertension    Past Surgical History:  Procedure  Laterality Date  . CARDIAC CATHETERIZATION  11/20/2008  . CATARACT EXTRACTION W/PHACO Right 05/10/2016   Procedure: CATARACT EXTRACTION PHACO AND INTRAOCULAR LENS PLACEMENT (IOC);  Surgeon: Birder Robson, MD;  Location: [facility] ORS;  Service: Ophthalmology;  Laterality: Right;  [place] 00:56 AP% 23.7 CDE 13.29 fluid pack lot # [id number]  . CATARACT EXTRACTION W/PHACO Left 01/10/2017   Procedure: CATARACT EXTRACTION PHACO AND INTRAOCULAR LENS PLACEMENT (IOC);  Surgeon: Birder Robson, MD;  Location: [facility] ORS;  Service: Ophthalmology;  Laterality: Left;  [place] 1:01 AP% 17.5 CDE 10.72 Fluid pack lot # [id number]  . COLON SURGERY     RESECTION  . COLONOSCOPY    . CORONARY ARTERY BYPASS GRAFT  11/24/2008  . KIDNEY SURGERY     PARTIAL RESECTION   PE: Vitals:   02/14/17 1119  BP: (!) 175/80  Pulse: 94  NAD Poor dentention  Imaging:  None new today.  Impression: High-grade metastatic prostate cancer, metastatic to both bone and pelvic/retroperitoneal lymph nodes, responding well to ADT.  Plan:  Patient was given Lupron 45 mg IM today. he patient also will continue ih clcium supplents as well as viamin D.  He still needs to Be seen in the oncology clinic, and we will re-refer him for him for consideration of Xgeva  and further management of his metastatic prostate cancer. We will plan for and follow up with [place] with a lab only appt in 3 months for PSA and then again in 6 months with  a PSA prior. At that point, we'll re-dose his Lupron.   We could also defer all his treatment to the cancer center if this would simplify his care.

## 2017-02-15 ENCOUNTER — Telehealth: Payer: Self-pay | Admitting: Urology

## 2017-02-15 ENCOUNTER — Emergency Department
Admission: EM | Admit: 2017-02-15 | Discharge: 2017-02-15 | Disposition: A | Discharge status: Home / Self Care | Payer: Medicare Other | Attending: Emergency Medicine | Admitting: Emergency Medicine

## 2017-02-15 DIAGNOSIS — I10 Essential (primary) hypertension: Secondary | ICD-10-CM | POA: Diagnosis not present

## 2017-02-15 DIAGNOSIS — I2510 Atherosclerotic heart disease of native coronary artery without angina pectoris: Secondary | ICD-10-CM | POA: Insufficient documentation

## 2017-02-15 DIAGNOSIS — Z7982 Long term (current) use of aspirin: Secondary | ICD-10-CM | POA: Diagnosis not present

## 2017-02-15 DIAGNOSIS — F918 Other conduct disorders: Secondary | ICD-10-CM | POA: Diagnosis not present

## 2017-02-15 DIAGNOSIS — R4689 Other symptoms and signs involving appearance and behavior: Secondary | ICD-10-CM

## 2017-02-15 DIAGNOSIS — Z8546 Personal history of malignant neoplasm of prostate: Secondary | ICD-10-CM | POA: Diagnosis not present

## 2017-02-15 DIAGNOSIS — Z951 Presence of aortocoronary bypass graft: Secondary | ICD-10-CM | POA: Diagnosis not present

## 2017-02-15 DIAGNOSIS — Z79899 Other long term (current) drug therapy: Secondary | ICD-10-CM | POA: Diagnosis not present

## 2017-02-15 DIAGNOSIS — Z046 Encounter for general psychiatric examination, requested by authority: Secondary | ICD-10-CM | POA: Diagnosis present

## 2017-02-15 DIAGNOSIS — F4325 Adjustment disorder with mixed disturbance of emotions and conduct: Secondary | ICD-10-CM | POA: Diagnosis not present

## 2017-02-15 DIAGNOSIS — R456 Violent behavior: Secondary | ICD-10-CM | POA: Diagnosis not present

## 2017-02-15 LAB — CBC
HCT: 31.5 % — ABNORMAL LOW (ref 40.0–52.0)
Hemoglobin: 9.9 g/dL — ABNORMAL LOW (ref 13.0–18.0)
MCH: 19.3 pg — ABNORMAL LOW (ref 26.0–34.0)
MCHC: 31.4 g/dL — ABNORMAL LOW (ref 32.0–36.0)
MCV: 61.4 fL — ABNORMAL LOW (ref 80.0–100.0)
PLATELETS: 283 10*3/uL (ref 150–440)
RBC: 5.13 MIL/uL (ref 4.40–5.90)
RDW: 16.4 % — ABNORMAL HIGH (ref 11.5–14.5)
WBC: 8.9 10*3/uL (ref 3.8–10.6)

## 2017-02-15 LAB — COMPREHENSIVE METABOLIC PANEL
ALT: 18 U/L (ref 17–63)
AST: 21 U/L (ref 15–41)
Albumin: 4.2 g/dL (ref 3.5–5.0)
Alkaline Phosphatase: 57 U/L (ref 38–126)
Anion gap: 9 (ref 5–15)
BUN: 38 mg/dL — ABNORMAL HIGH (ref 6–20)
CHLORIDE: 108 mmol/L (ref 101–111)
CO2: 19 mmol/L — AB (ref 22–32)
Calcium: 8.7 mg/dL — ABNORMAL LOW (ref 8.9–10.3)
Creatinine, Ser: 1.84 mg/dL — ABNORMAL HIGH (ref 0.61–1.24)
GFR, EST AFRICAN AMERICAN: 37 mL/min — AB (ref >60)
GFR, EST NON AFRICAN AMERICAN: 32 mL/min — AB (ref >60)
Glucose, Bld: 133 mg/dL — ABNORMAL HIGH (ref 65–99)
POTASSIUM: 3.9 mmol/L (ref 3.5–5.1)
SODIUM: 136 mmol/L (ref 135–145)
Total Bilirubin: 0.6 mg/dL (ref 0.3–1.2)
Total Protein: 7.2 g/dL (ref 6.5–8.1)

## 2017-02-15 LAB — URINE DRUG SCREEN, QUALITATIVE (ARMC ONLY)
AMPHETAMINES, UR SCREEN: NOT DETECTED
Barbiturates, Ur Screen: NOT DETECTED
Benzodiazepine, Ur Scrn: NOT DETECTED
Cannabinoid 50 Ng, Ur ~~LOC~~: NOT DETECTED
Cocaine Metabolite,Ur ~~LOC~~: NOT DETECTED
MDMA (ECSTASY) UR SCREEN: NOT DETECTED
Methadone Scn, Ur: NOT DETECTED
Opiate, Ur Screen: NOT DETECTED
Phencyclidine (PCP) Ur S: NOT DETECTED
TRICYCLIC, UR SCREEN: NOT DETECTED

## 2017-02-15 LAB — SALICYLATE LEVEL

## 2017-02-15 LAB — ETHANOL

## 2017-02-15 LAB — ACETAMINOPHEN LEVEL: Acetaminophen (Tylenol), Serum: <10 ug/mL — ABNORMAL LOW (ref 10–30)

## 2017-02-15 NOTE — ED Notes (Signed)
Pt given two warm blankets.  

## 2017-02-15 NOTE — Telephone Encounter (Signed)
Please refer this patient to the oncology clinic for further management of metastatic prostate cancer.  Per dr. Louis Meckel he wants this patient to be seen at the cancer center but he never put the referral in the workqueue. Bary Castilla can you put this in for me please so that we can get this done for him.  Thanks,  Sharyn Lull

## 2017-02-15 NOTE — ED Notes (Signed)
Pt verbalized frustration again and verbalized "I am going to leave this place, I don't care anymore." Pt verbalized that he doesn't know why he is still here. RN attempted to talk pt down.  RN called daughter again to update. Dr. Weber Cooks now at bedside to assess pt.

## 2017-02-15 NOTE — ED Notes (Signed)
Ptr ambulatory at time of d/c and left with daughter. PT verbalized understanding of visit and discharge.
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
## 2017-02-15 NOTE — ED Provider Notes (Signed)
The patient has been evaluated at bedside by Dr. Weber Cooks, psychiatry.  Patient is clinically stable.  Not felt to be a danger to self or others.  No SI or Hi.  No indication for inpatient psychiatric admission at this time.  Appropriate for continued outpatient therapy.    Merlyn Lot, MD 02/15/17 [phone]
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
## 2017-02-15 NOTE — ED Notes (Signed)
Dr. Weber Cooks rescinded papers
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
## 2017-02-15 NOTE — Discharge Instructions (Signed)
Please follow up with PCP.  Return for any thoughts of hurting yourself or others.  REturn for any additional questions or concerns.

## 2017-02-16 ENCOUNTER — Other Ambulatory Visit: Payer: Self-pay

## 2017-02-16 DIAGNOSIS — C61 Malignant neoplasm of prostate: Secondary | ICD-10-CM

## 2017-03-03 ENCOUNTER — Inpatient Hospital Stay: Payer: Medicare Other | Admitting: Oncology

## 2017-03-03 ENCOUNTER — Telehealth: Payer: Self-pay
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
## 2017-03-03 NOTE — Telephone Encounter (Signed)
I received a phone call from [place] at the [facility]. She informed me that the pt No showed for his visit today. She called to f/u with him and he stated that he had no idea about this appt and did not desire to reschedule.

## 2017-03-10 NOTE — Telephone Encounter (Signed)
I attempted to contact the pt several times no answer.

## 2017-03-10 NOTE — Telephone Encounter (Signed)
-----   Message from Ardis Hughs, MD sent at 03/04/2017  5:59 PM EDT ----- Regarding: call back Can you call him back and ask him if he'd be willing to go to the [place] center instead?

## 2017-03-10 NOTE — Telephone Encounter (Signed)
I spoke w/ the patient and he declined going to the [facility] all together. The pt stated he have lost all trust in the medical profession altogether. He feel like the cancer center killed his wife. He said he will continue to f/u with Urology, but doesn't wish to be seen at the Cancer. The pt was very aggravated and stated several times he was very depress and he  had a lot going on in his family since his wife passed.

## 2017-03-31 ENCOUNTER — Telehealth: Payer: Self-pay

## 2017-03-31 NOTE — Telephone Encounter (Signed)
-----   Message from Ardis Hughs, MD sent at 03/04/2017  5:59 PM EDT ----- Regarding: call back Can you call him back and ask him if he'd be willing to go to the [place] center instead?

## 2017-03-31 NOTE — Telephone Encounter (Signed)
Please see first message. The pt was given the option to go to [facility] he declined stating that [place] was to far and he does not have much faith anymore with the medical field. I stress the appts of following up with the [facility], but the pt declined.

## 2017-04-06 IMAGING — MR MR HEAD W/O CM
9 of 10 series · 36 of 48 positions shown · non-contrast
Comparison: Prior study from 06/30/2010.

CLINICAL DATA: Initial evaluation to move bilateral upper
extremities, acute onset.

EXAM:
MRI HEAD WITHOUT CONTRAST
TECHNIQUE: Multiplanar, multiecho pulse sequences of the brain and surrounding
structures were obtained without intravenous contrast.

[Series 3: T1 · sagittal · 5.0mm · 0.47mm/px · 2 of 25 slices shown]
[im 1/25]
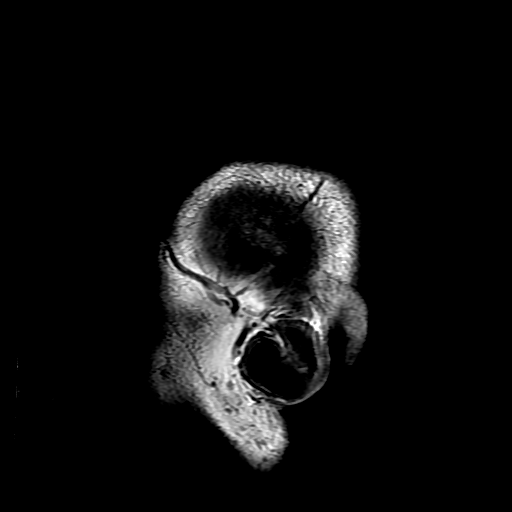
[im 25/25]
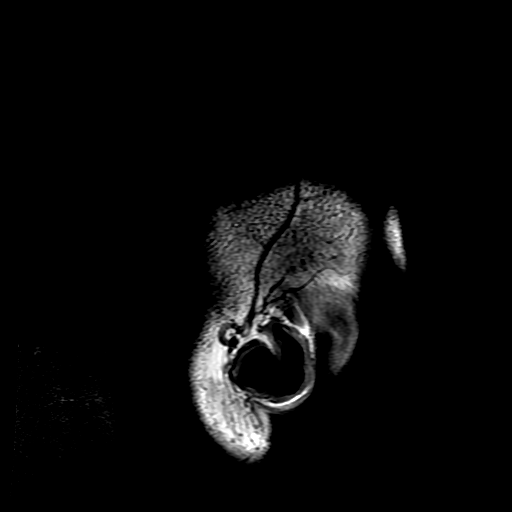

[Series 4: DWI · axial · 3.0mm · 1.09mm/px · z∈[-25,+114]mm · 9 of 98 slices shown (1 of 4)]
[im 1/98]
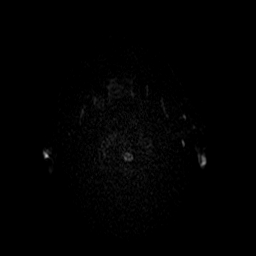
[im 13/98]
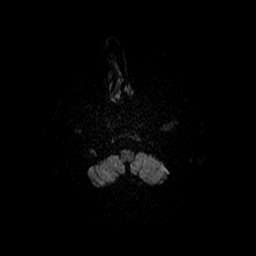
[im 25/98]
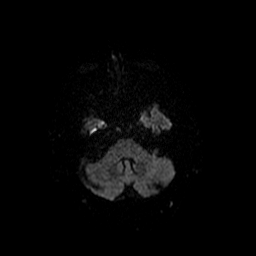
[im 37/98]
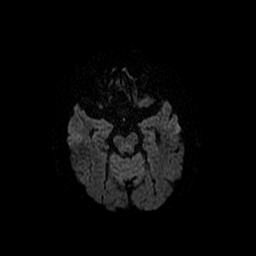
[im 49/98]
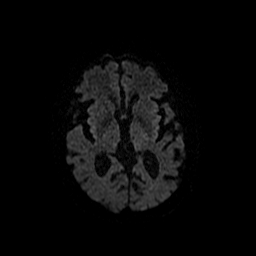
[im 61/98]
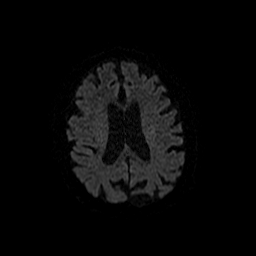
[im 73/98]
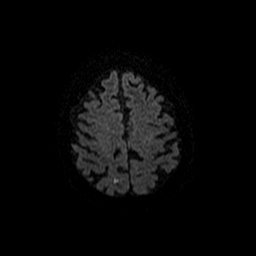
[im 85/98]
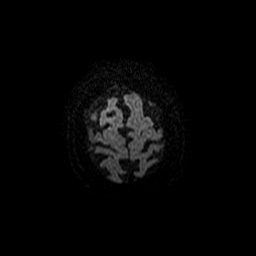
[im 98/98]
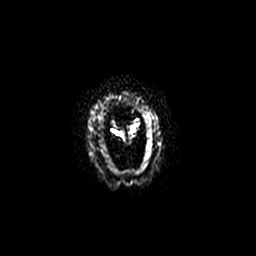

[Series 5: T2 · axial · 5.0mm · 0.43mm/px · z∈[-33,+107]mm · 2 of 25 slices shown (1 of 2)]
[im 1/25]
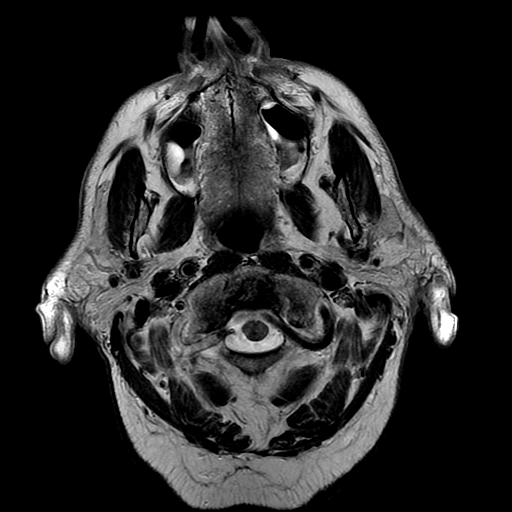
[im 25/25]
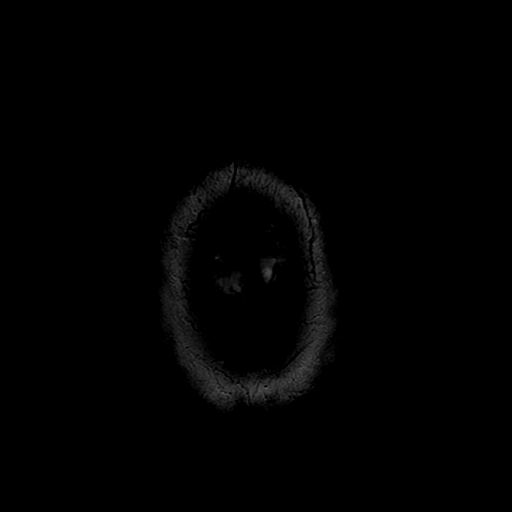

[Series 6: DWI · coronal · 5.0mm · 1.09mm/px · 7 of 70 slices shown (2 of 4)]
[im 1/70]
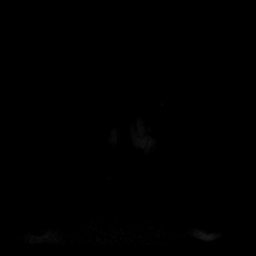
[im 12/70]
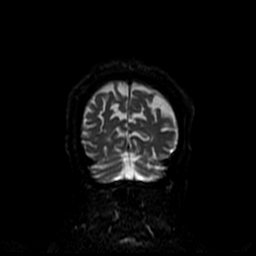
[im 24/70]
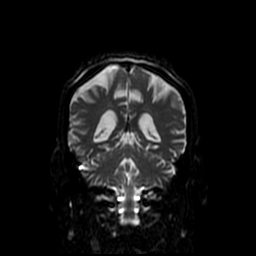
[im 35/70]
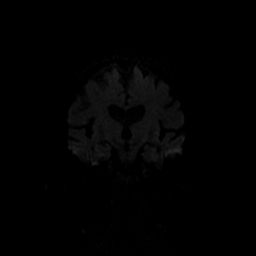
[im 47/70]
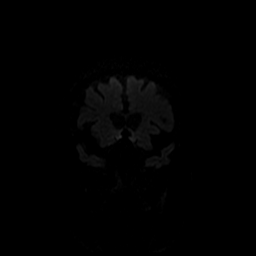
[im 58/70]
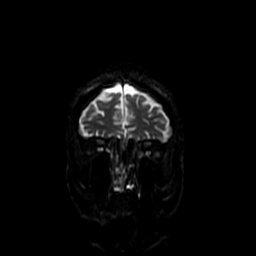
[im 70/70]
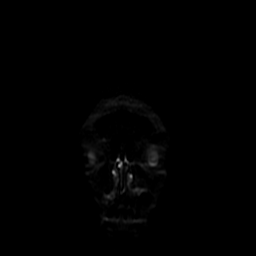

[Series 7: FLAIR · axial · 5.0mm · 0.43mm/px · z∈[-33,+107]mm · 3 of 25 slices shown]
[im 1/25]
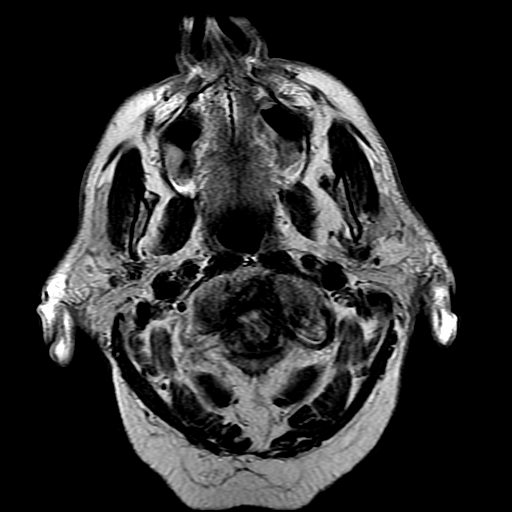
[im 13/25]
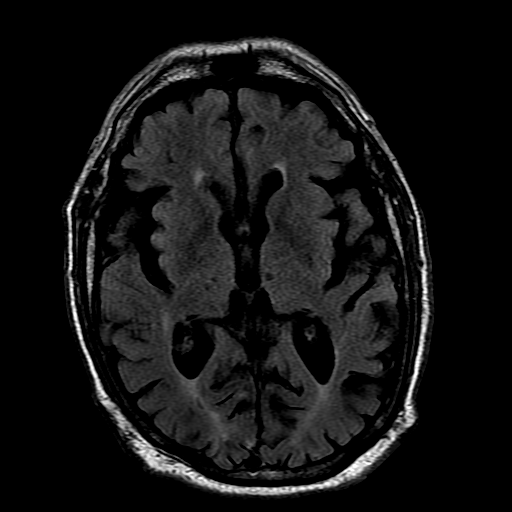
[im 25/25]
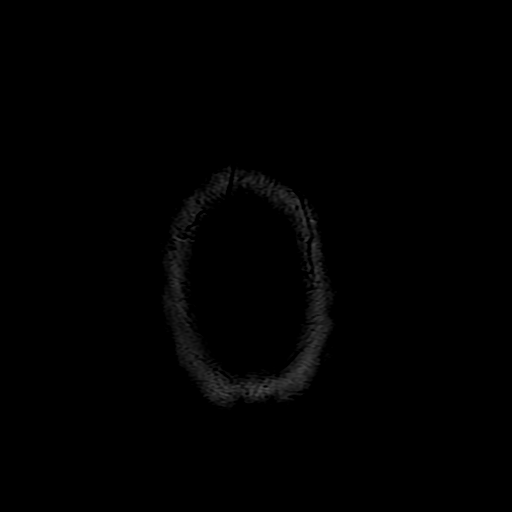

[Series 8: ax mpgr · axial · 5.0mm · 0.45mm/px · 1 of 25 slices shown]
[im 1/25]
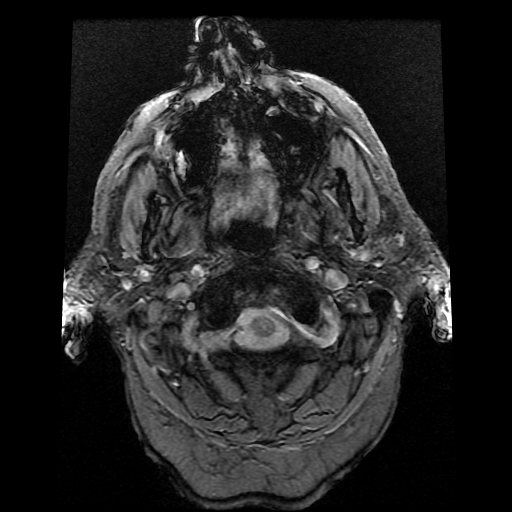

[Series 10: T2 · coronal · 5.0mm · 0.39mm/px · 3 of 27 slices shown (2 of 2)]
[im 1/27]
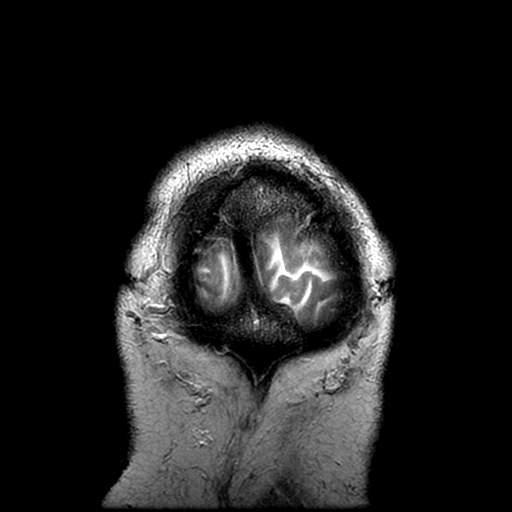
[im 14/27]
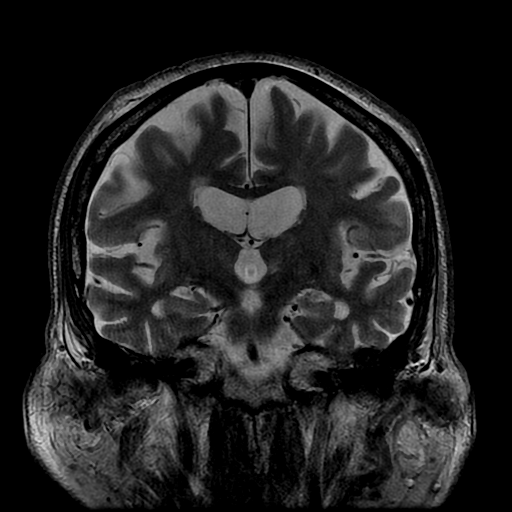
[im 27/27]
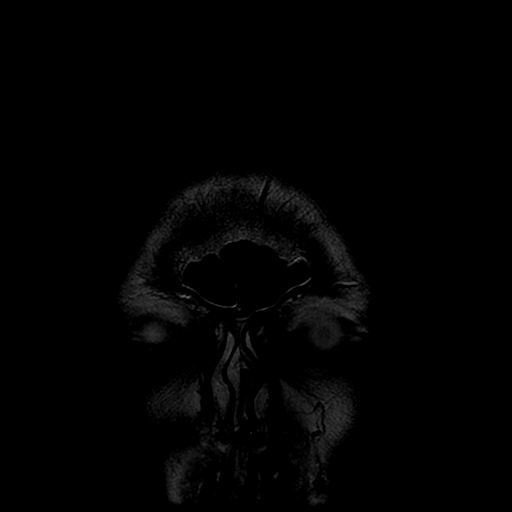

[Series 400: DWI · axial · 3.0mm · 1.09mm/px · z∈[-25,+114]mm · 5 of 49 slices shown (3 of 4)]
[im 1/49]
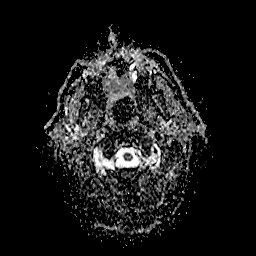
[im 13/49]
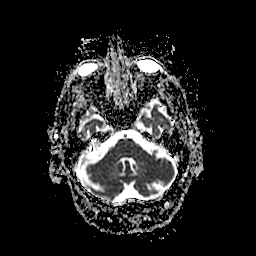
[im 25/49]
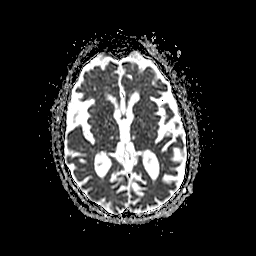
[im 37/49]
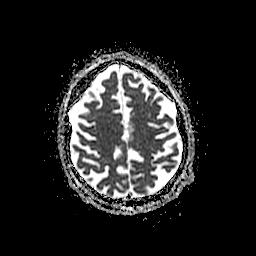
[im 49/49]
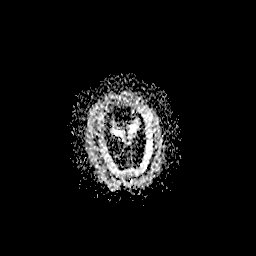

[Series 600: DWI · coronal · 5.0mm · 1.09mm/px · 4 of 35 slices shown (4 of 4)]
[im 1/35]
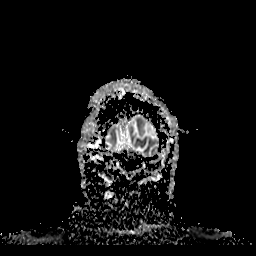
[im 12/35]
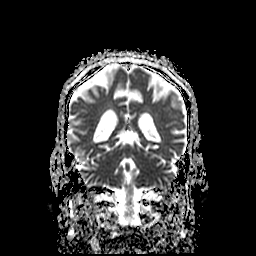
[im 23/35]
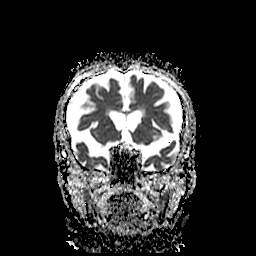
[im 35/35]
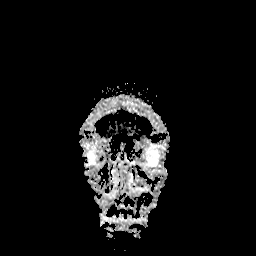

[36 of 48 positions shown; findings below may reference images not displayed]

FINDINGS: Punctate 5 mm ischemic infarct within the right caudate head.
Additional tiny 8 mm cortical infarct within the right parietal
lobe. No associated hemorrhage or mass effect. No other infarct.
Normal intravascular flow voids are preserved.

Diffuse prominence of the CSF containing spaces is compatible with
generalized age-related cerebral atrophy. Patchy T2/FLAIR
hyperintensity within the periventricular and deep white matter most
consistent with chronic small vessel ischemic disease. Remote
lacunar infarcts present within the bilateral thalami. Small remote
lacunar infarct within the left caudate head. Multiple remote
bilateral cerebellar infarcts present as well. Small remote lacunar
infarct within the anterior right corona radiata/centrum semi ovale
with associated chronic micro hemorrhage present as well.

No mass lesion, midline shift, or mass effect. Ventricular
prominence related to global parenchymal volume loss present without
hydrocephalus. No extra-axial fluid collection.

Craniocervical junction within normal limits.

Pituitary gland normal.  No acute abnormality about the orbits.

Scattered mucosal thickening with throughout the paranasal sinuses.
No mastoid effusion. Inner ear structures grossly normal.

Bone marrow signal intensity within normal limits. No scalp soft
tissue abnormality.
IMPRESSION: 1. Punctate 5 mm acute ischemic nonhemorrhagic infarct within the
right caudate head.
2. Small 8 mm cortical ischemic nonhemorrhagic infarct within the
right parietal lobe.
3. Atrophy with mild chronic small vessel ischemic disease and
multiple remote lacunar infarcts as above.

## 2017-04-06 IMAGING — MR MR CERVICAL SPINE W/O CM
4 of 5 series · 17 of 48 positions shown · non-contrast
Comparison: None.

CLINICAL DATA: Initial evaluation for acute onset inability to move
bilateral upper extremities.

EXAM:
MRI CERVICAL SPINE WITHOUT CONTRAST
TECHNIQUE: Multiplanar, multisequence MR imaging of the cervical spine was
performed. No intravenous contrast was administered.

[Series 2: T2 · sagittal · 3.0mm · 0.43mm/px · 8 of 17 slices shown (1 of 2)]
[im 1/17]
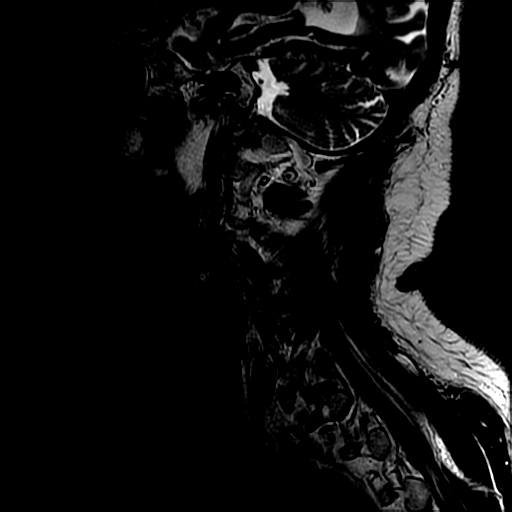
[im 3/17]
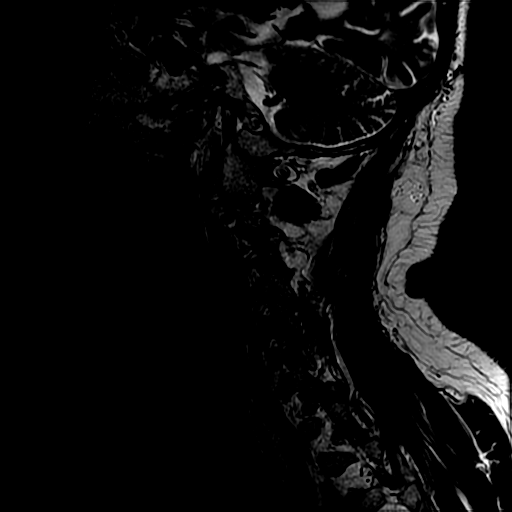
[im 5/17]
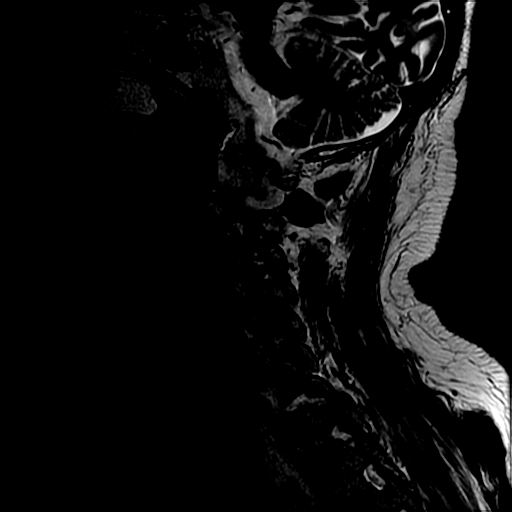
[im 7/17]
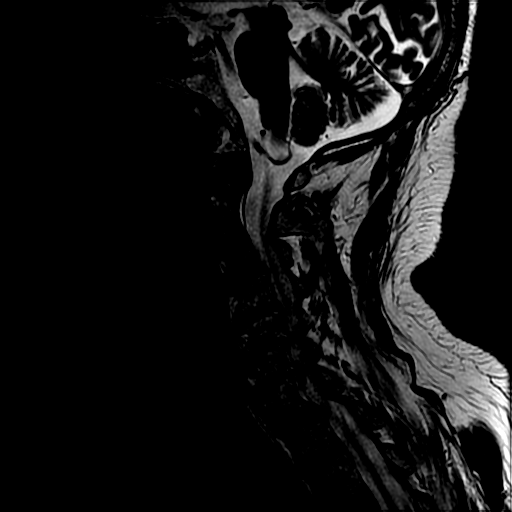
[im 10/17]
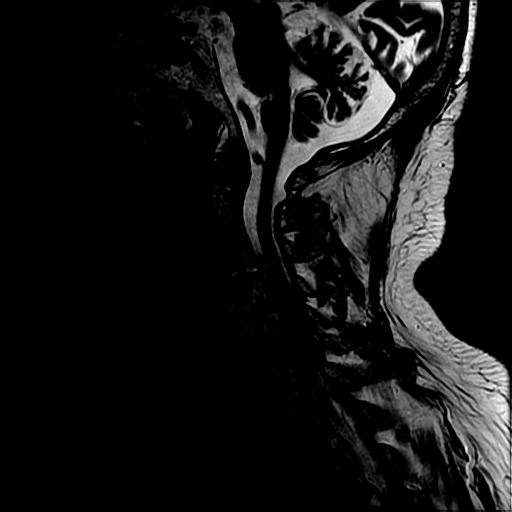
[im 12/17]
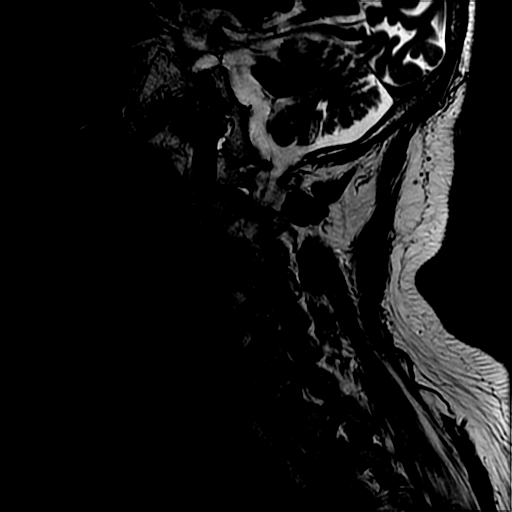
[im 14/17]
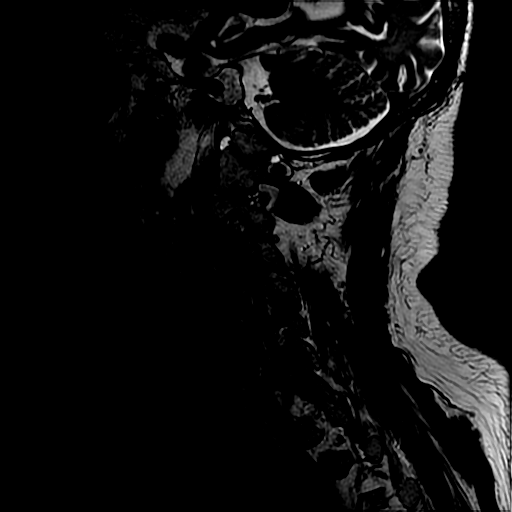
[im 17/17]
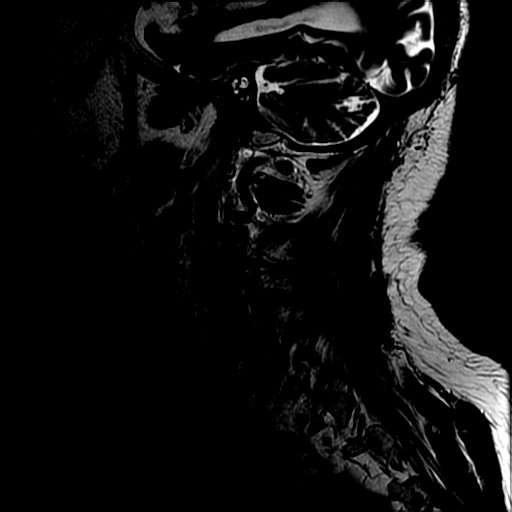

[Series 3: T1 · sagittal · 3.0mm · 0.43mm/px · 3 of 17 slices shown]
[im 3/17]
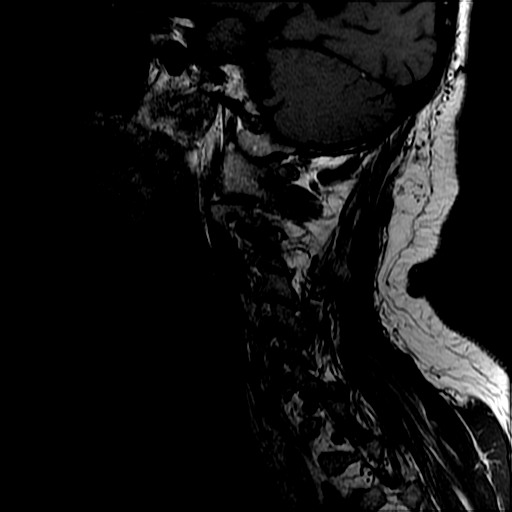
[im 9/17]
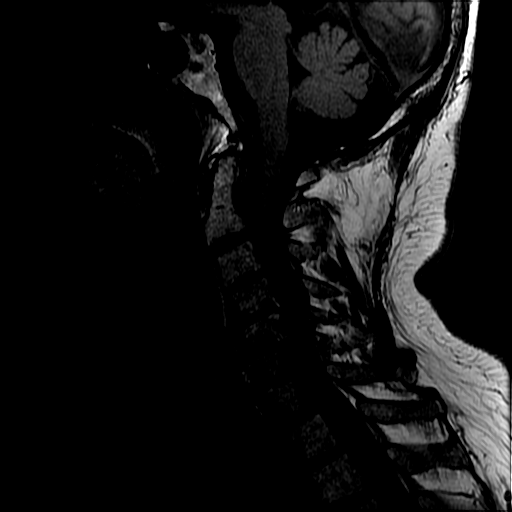
[im 14/17]
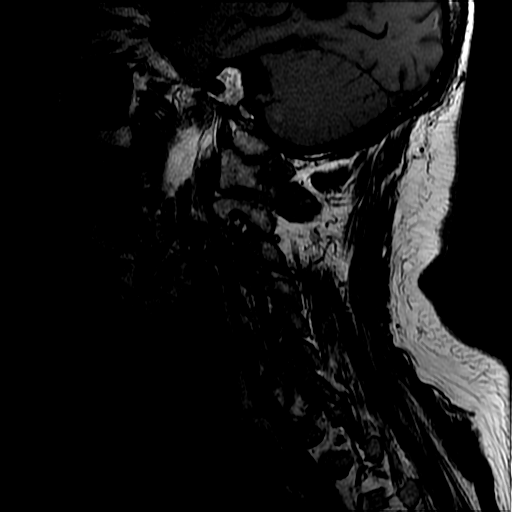

[Series 4: sag ir · sagittal · 3.0mm · 0.43mm/px · 3 of 17 slices shown]
[im 3/17]
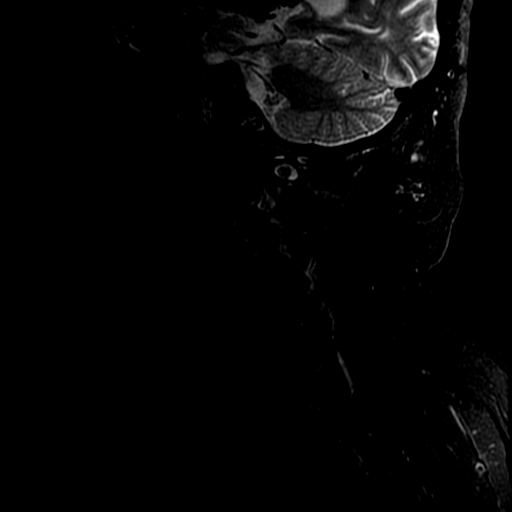
[im 9/17]
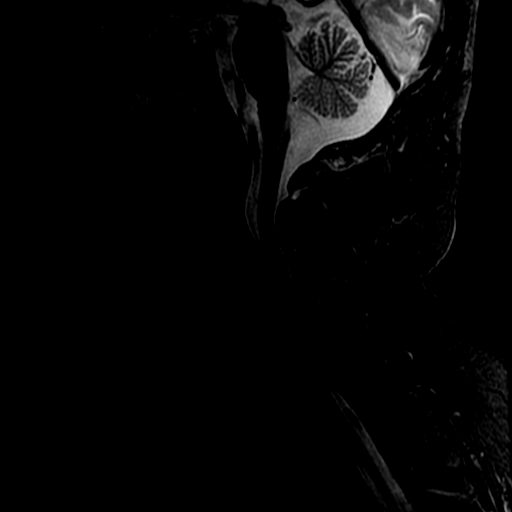
[im 14/17]
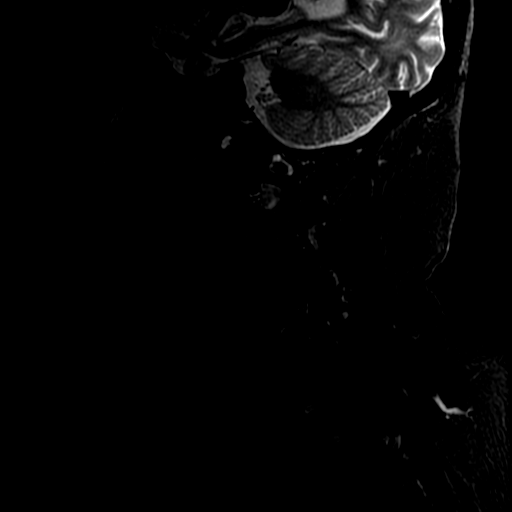

[Series 6: T2 · axial · 3.0mm · 0.39mm/px · z∈[-200,-138]mm · 3 of 29 slices shown (2 of 2)]
[im 5/29]
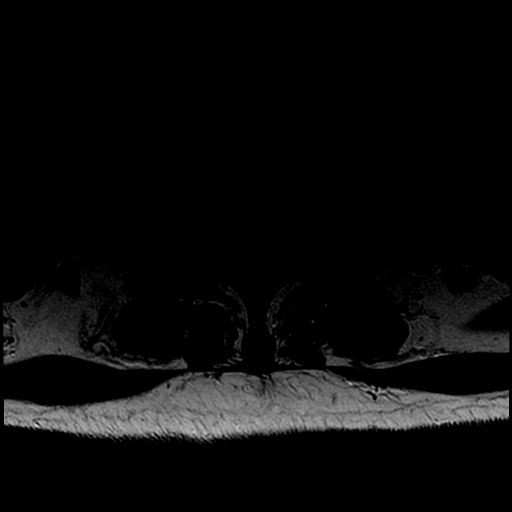
[im 15/29]
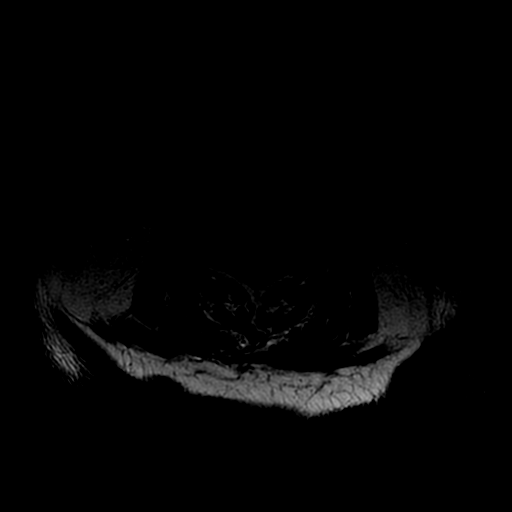
[im 24/29]
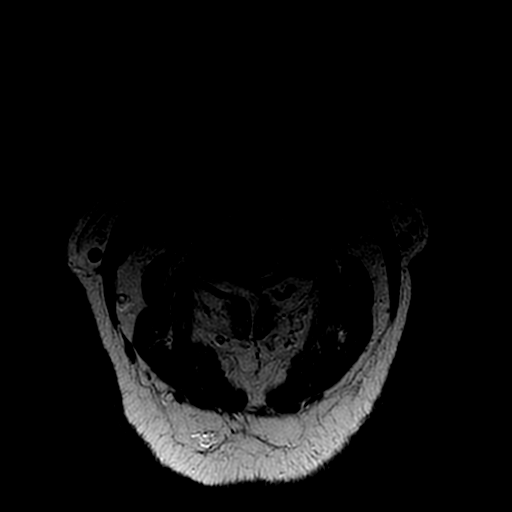

[17 of 48 positions shown; findings below may reference images not displayed]

FINDINGS: Visualized portions of the brain and posterior fossa demonstrate no
acute abnormality. Craniocervical junction widely patent.

Straightening with slight reversal of the normal cervical lordosis
with apex at C4. There is 3 mm anterolisthesis of C7 on T1. Trace
retrolisthesis of C5 on C6. This is likely chronic in nature.

No fracture. Signal intensity within the vertebral body bone marrow
is normal. No marrow edema. No focal osseous lesion.

Signal intensity within the cervical spinal cord is normal.

No paraspinous soft tissue abnormality.  No prevertebral edema.

C2-C3: Mild bilateral uncovertebral hypertrophy, slightly worse on
the right. Superimposed right-sided facet arthrosis. There is
resultant moderate right foraminal stenosis. No significant left
foraminal narrowing. Central canal is widely patent.

C3-C4: Diffuse degenerative disc osteophyte with bilateral
uncovertebral hypertrophy. Left greater than right facet arthrosis.
There is resultant moderate to severe left with moderate right
foraminal stenosis. Broad-based posterior disc bulge indents and
largely effaces the ventral thecal sac and results in moderate canal
narrowing.

C4-C5: Diffuse degenerative disc osteophyte with intervertebral disc
space narrowing and bilateral uncovertebral hypertrophy. Mild
bilateral facet arthrosis. There is resultant fairly severe
bilateral foraminal stenosis. Broad-based posterior central/left
paracentral disc osteophyte flattens and largely effaces the ventral
thecal sac and results in moderate canal stenosis. Minimal
flattening of the cervical spinal cord without cord signal changes.

C5-C6: There is a predominantly central/ right paracentral disc
osteophyte complex that indents the right ventral thecal sac and
flattens the right aspect of the cervical spinal cord. No cord
signal changes. Superimposed bilateral uncovertebral hypertrophy and
facet arthrosis. There is resultant severe right with moderate left
foraminal narrowing. Moderate canal stenosis.

C6-C7: Broad-based degenerative disc osteophyte with mild
uncovertebral hypertrophy and prominent intervertebral disc space
narrowing. There is resultant moderate bilateral foraminal stenosis,
slightly worse on the left. Broad-based posterior disc osteophyte
mildly effaces the ventral thecal sac and results in mild to
moderate canal stenosis.

C7-T1: 3 mm anterolisthesis of C7 on T1. Secondary mild diffuse disc
bulge without focal disc herniation. Ligamentum flavum hypertrophy
and bilateral facet arthrosis. There is resultant mild left
foraminal stenosis. No significant right foraminal canal stenosis.

Visualized upper thoracic spine demonstrates no acute abnormality.
IMPRESSION: 1. No acute abnormality within the cervical spine.
2. Straightening with slight reversal of the normal cervical
lordosis with moderate to advanced multilevel degenerative
spondylolysis as above, most severe at the C4-5 through C6-7 levels.
There is resultant moderate canal stenosis at C3-4 through C6-7.
Please see above report for a full description of these findings.

## 2017-04-22 ENCOUNTER — Emergency Department: Payer: Medicare Other

## 2017-04-22 ENCOUNTER — Emergency Department
Admission: EM | Admit: 2017-04-22 | Discharge: 2017-04-22 | Discharge status: Against Medical Advice | Payer: Medicare Other | Attending: Emergency Medicine | Admitting: Emergency Medicine

## 2017-04-22 DIAGNOSIS — I10 Essential (primary) hypertension: Secondary | ICD-10-CM | POA: Diagnosis not present

## 2017-04-22 DIAGNOSIS — I6523 Occlusion and stenosis of bilateral carotid arteries: Secondary | ICD-10-CM | POA: Diagnosis not present

## 2017-04-22 DIAGNOSIS — I2510 Atherosclerotic heart disease of native coronary artery without angina pectoris: Secondary | ICD-10-CM | POA: Diagnosis not present

## 2017-04-22 DIAGNOSIS — Z79899 Other long term (current) drug therapy: Secondary | ICD-10-CM | POA: Diagnosis not present

## 2017-04-22 DIAGNOSIS — H5123 Internuclear ophthalmoplegia, bilateral: Secondary | ICD-10-CM | POA: Diagnosis not present

## 2017-04-22 DIAGNOSIS — R42 Dizziness and giddiness: Secondary | ICD-10-CM | POA: Diagnosis not present

## 2017-04-22 DIAGNOSIS — H5121 Internuclear ophthalmoplegia, right eye: Secondary | ICD-10-CM | POA: Diagnosis not present

## 2017-04-22 DIAGNOSIS — R29818 Other symptoms and signs involving the nervous system: Secondary | ICD-10-CM | POA: Diagnosis not present

## 2017-04-22 DIAGNOSIS — H532 Diplopia: Secondary | ICD-10-CM | POA: Diagnosis not present

## 2017-04-22 DIAGNOSIS — I639 Cerebral infarction, unspecified: Secondary | ICD-10-CM | POA: Diagnosis not present

## 2017-04-22 DIAGNOSIS — H538 Other visual disturbances: Secondary | ICD-10-CM | POA: Insufficient documentation

## 2017-04-22 DIAGNOSIS — R51 Headache: Secondary | ICD-10-CM | POA: Diagnosis not present

## 2017-04-22 LAB — COMPREHENSIVE METABOLIC PANEL
ALBUMIN: 3.7 g/dL (ref 3.5–5.0)
ALT: 18 U/L (ref 17–63)
ANION GAP: 9 (ref 5–15)
AST: 23 U/L (ref 15–41)
Alkaline Phosphatase: 60 U/L (ref 38–126)
BILIRUBIN TOTAL: 0.7 mg/dL (ref 0.3–1.2)
BUN: 32 mg/dL — ABNORMAL HIGH (ref 6–20)
CHLORIDE: 106 mmol/L (ref 101–111)
CO2: 22 mmol/L (ref 22–32)
Calcium: 8.8 mg/dL — ABNORMAL LOW (ref 8.9–10.3)
Creatinine, Ser: 2.09 mg/dL — ABNORMAL HIGH (ref 0.61–1.24)
GFR calc Af Amer: 32 mL/min — ABNORMAL LOW (ref >60)
GFR calc non Af Amer: 27 mL/min — ABNORMAL LOW (ref >60)
GLUCOSE: 117 mg/dL — AB (ref 65–99)
POTASSIUM: 3.8 mmol/L (ref 3.5–5.1)
SODIUM: 137 mmol/L (ref 135–145)
TOTAL PROTEIN: 6.8 g/dL (ref 6.5–8.1)

## 2017-04-22 LAB — CBC
HCT: 29.8 % — ABNORMAL LOW (ref 40.0–52.0)
Hemoglobin: 9.6 g/dL — ABNORMAL LOW (ref 13.0–18.0)
MCH: 19.2 pg — ABNORMAL LOW (ref 26.0–34.0)
MCHC: 32.2 g/dL (ref 32.0–36.0)
MCV: 59.8 fL — ABNORMAL LOW (ref 80.0–100.0)
PLATELETS: 281 10*3/uL (ref 150–440)
RBC: 4.98 MIL/uL (ref 4.40–5.90)
RDW: 17.3 % — AB (ref 11.5–14.5)
WBC: 8.6 10*3/uL (ref 3.8–10.6)

## 2017-04-22 LAB — DIFFERENTIAL
BASOS ABS: 0.1 10*3/uL (ref 0–0.1)
BASOS PCT: 1 %
EOS ABS: 0.3 10*3/uL (ref 0–0.7)
EOS PCT: 4 %
Lymphocytes Relative: 9 %
Lymphs Abs: 0.8 10*3/uL — ABNORMAL LOW (ref 1.0–3.6)
Monocytes Absolute: 0.6 10*3/uL (ref 0.2–1.0)
Monocytes Relative: 7 %
NEUTROS PCT: 79 %
Neutro Abs: 6.9 10*3/uL — ABNORMAL HIGH (ref 1.4–6.5)

## 2017-04-22 LAB — PROTIME-INR
INR: 1
PROTHROMBIN TIME: 13.2 s (ref 11.4–15.2)

## 2017-04-22 LAB — TROPONIN I: Troponin I: <0.03 ng/mL (ref <0.03)

## 2017-04-22 LAB — APTT: APTT: 33 s (ref 24–36)

## 2017-04-22 MED ORDER — ASPIRIN 81 MG PO CHEW
324.0000 mg | CHEWABLE_TABLET | Freq: Once | ORAL | Status: AC
  Administered 2017-04-22: 324 mg via ORAL
  Filled 2017-04-22: qty 4

## 2017-04-22 NOTE — ED Notes (Signed)
ED Provider at bedside. 

## 2017-04-22 NOTE — ED Triage Notes (Signed)
Patient presents to the ED after going to the eye doctor for double vision that began around 12:30.  Patient states he was attempting to drive home and he had to squint on the way home and then he called his grandson.  Patient states, "I think I'm having a nervous break down.  Patient states his wife recently died and patient is tearful in triage.  Patient also reports his son has recently taken him to court."

## 2017-04-22 NOTE — ED Notes (Signed)
CODE STROKE CALLED TO [phone] 

## 2017-04-22 NOTE — Progress Notes (Signed)
[place] responded to a page for code stroke for a Pt in [place]. [place] met Pt. Pt's grandson was at the bedside. Pt alert and talking. Pt stated he was still grieving a loss of his wife who passed away 2 months ago, and had a conflict with his sons that Pt attributed to his current health problems. Dcs were assessing Pt in order to provide the best care. [place] visited Pt again at 6:00pm, but Pt had gone to take MRI. [place] talked to Pt's family and promised to follow up with Pt later.    04/22/17 1900  Clinical Encounter Type  Visited With Patient;Patient and family together  Visit Type Initial;Follow-up;Spiritual support;Code;Trauma  Referral From Nurse  Consult/Referral To Chaplain  Spiritual Encounters  Spiritual Needs Emotional;Other (Comment)

## 2017-04-22 NOTE — ED Notes (Signed)
Patient gave permission for his medical information to be discussed with the following Gaspar Bidding and [name], [place].   [name] [phone] [place] [facility]  [phone]

## 2017-04-22 NOTE — ED Notes (Signed)
CODE STROKE CALLED TO [facility] AT [facility]

## 2017-04-22 NOTE — ED Notes (Signed)
Patient was advised that he is leaving against medical advise. The risks of leaving AMA were again explained to the patient. The patient verbalized understanding. The patient affirmed his decision to leave AMA and follow-up next week for further testing/procedures. RN reviewed signs of stroke, signs that would necessitate returning to the emergency room, and safety information. Pt verbalized understanding.

## 2017-04-22 NOTE — ED Notes (Signed)
CODE STROKE CALLED TO [facility] AT [facility]

## 2017-04-22 NOTE — ED Provider Notes (Addendum)
[facility] Emergency Department Provider Note  ____________________________________________   First MD Initiated Contact with Patient 04/22/17 1506     (approximate)  I have reviewed the triage vital signs and the nursing notes.   HISTORY  Chief Complaint Visual Field Change   HPI Jake Garcia is a 83 y.o. male with a history of coronary artery disease, hyperlipidemia and hypertension is present emergency department today with diplopia. He was seen as an outpatient by the ophthalmologist who brought him to the emergency department today for concerns for stroke. The patient said the symptoms started at 12:30 while driving. He says that he is also under considerable stress because of legal disputes recentlybetween his children after the death of his wife. He thinks that this event may have been stress related.   Past Medical History:  Diagnosis Date  . Coronary artery disease   . Hyperlipidemia   . Hypertension     Patient Active Problem List   Diagnosis Date Noted  . 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 Prostate cancer ([place]) 06/28/2016  . Elevated PSA 05/23/2016  . Nodular prostate 05/23/2016  . Gross hematuria 04/29/2016  . Prostate nodule 04/29/2016  . 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 Nocturia 04/29/2016  . Cerebral thrombosis with cerebral infarction 10/12/2015  . Central cord syndrome ([place]) 10/11/2015  . Nasal obstruction 11/22/2013  . Deflected nasal septum 11/22/2013  . Collapse of nasal valve 11/22/2013  . Bulbous nose 11/22/2013  . Carotid stenosis 06/25/2013  . Preop cardiovascular exam 06/25/2013  . Encounter for preprocedural cardiovascular examination 06/25/2013  . Carotid artery narrowing 06/25/2013  . CAD (coronary artery disease) 08/05/2011  . Hyperlipidemia 08/05/2011  . HTN (hypertension) 08/05/2011  . Chest discomfort 08/05/2011  .  Arteriosclerosis of coronary artery 08/05/2011  . BP (high blood pressure) 08/05/2011    Past Surgical History:  Procedure Laterality Date  . CARDIAC CATHETERIZATION  11/20/2008  . CATARACT EXTRACTION W/PHACO Right 05/10/2016   Procedure: CATARACT EXTRACTION PHACO AND INTRAOCULAR LENS PLACEMENT (IOC);  Surgeon: Birder Robson, MD;  Location: [facility] ORS;  Service: Ophthalmology;  Laterality: Right;  [place] 00:56 AP% 23.7 CDE 13.29 fluid pack lot # [id number]  . CATARACT EXTRACTION W/PHACO Left 01/10/2017   Procedure: CATARACT EXTRACTION PHACO AND INTRAOCULAR LENS PLACEMENT (IOC);  Surgeon: Birder Robson, MD;  Location: [facility] ORS;  Service: Ophthalmology;  Laterality: Left;  [place] 1:01 AP% 17.5 CDE 10.72 Fluid pack lot # [id number]  . COLON SURGERY     RESECTION  . COLONOSCOPY    . CORONARY ARTERY BYPASS GRAFT  11/24/2008  . KIDNEY SURGERY     PARTIAL RESECTION    Prior to Admission medications   Medication Sig Start Date End Date Taking? Authorizing Provider  amLODipine (NORVASC) 10 MG tablet TAKE ONE TABLET BY MOUTH ONCE DAILY Patient taking differently: Take 10mg s daily in the evening 12/20/16  Yes Gollan, Kathlene November, MD  DUREZOL 0.05 % EMUL Place 1 drop into both eyes daily.  01/03/17  Yes [provider]  rosuvastatin (CRESTOR) 10 MG tablet TAKE ONE TABLET BY MOUTH ONCE DAILY Patient taking differently: Take 10mg s daily in the evening 12/20/16  Yes Gollan, Kathlene November, MD  tamsulosin (FLOMAX) 0.4 MG CAPS capsule Take 1 capsule (0.4 mg total) by mouth 2 (two) times daily. 06/15/16  Yes McKenzie, Candee Furbish, MD  aspirin 325 MG tablet Take 1 tablet (325 mg total) by mouth daily. Patient not taking: Reported on  02/14/2017 10/13/15   Geradine Girt, DO    Allergies Patient has no known allergies.  Family History  Problem Relation Age of Onset  . Family history unknown: Yes    Social History Social History  Substance Use Topics  . Smoking status: Never Smoker  . Smokeless tobacco:  Never Used  . Alcohol use No    Review of Systems  Constitutional: No fever Eyes:  As above ENT: No sore throat. Cardiovascular: Denies chest pain. Respiratory: Denies shortness of breath. Gastrointestinal: No abdominal pain.  No nausea, no vomiting.  No diarrhea.  No constipation. Genitourinary: Negative for dysuria. Musculoskeletal: Negative for back pain. Skin: Negative for rash. Neurological: Negative for headaches, focal weakness or numbness.   ____________________________________________   PHYSICAL EXAM:  VITAL SIGNS: ED Triage Vitals  Enc Vitals Group     BP 04/22/17 1432 (!) 121/94     Pulse Rate 04/22/17 1432 78     Resp 04/22/17 1432 18     Temp 04/22/17 1432 99.3 F (37.4 C)     Temp Source 04/22/17 1432 Oral     SpO2 04/22/17 1432 99 %     Weight 04/22/17 1433 155 lb (70.3 kg)     Height 04/22/17 1433 5' 7.5" (1.715 m)     Head Circumference --      Peak Flow --      Pain Score --      Pain Loc --      Pain Edu? --      Excl. in [name]? --     Constitutional: Alert and oriented. Well appearing and in no acute distress. Eyes: Conjunctivae are normal. Right medial rectus palsy. I does not cross the midline when looking left. Eyes were dilated prior to arrival and are not reactive to light at this time. Head: Atraumatic. Nose: No congestion/rhinnorhea. Mouth/Throat: Mucous membranes are moist.   Neck: No stridor.   Cardiovascular: Normal rate, regular rhythm. Grossly normal heart sounds.   Respiratory: Normal respiratory effort.  No retractions. Lungs CTAB. Gastrointestinal: Soft and nontender. No distention. No abdominal bruits. No CVA tenderness. Musculoskeletal: No lower extremity tenderness nor edema.  No joint effusions. Neurologic:  Normal speech and language. See eye exam. No gait instability. Skin:  Skin is warm, dry and intact. No rash noted. Psychiatric: Mood and affect are normal. Speech and behavior are  normal.  ____________________________________________   LABS (all labs ordered are listed, but only abnormal results are displayed)  Labs Reviewed  CBC - Abnormal; Notable for the following:       Result Value   Hemoglobin 9.6 (*)    HCT 29.8 (*)    MCV 59.8 (*)    MCH 19.2 (*)    RDW 17.3 (*)    All other components within normal limits  DIFFERENTIAL - Abnormal; Notable for the following:    Neutro Abs 6.9 (*)    Lymphs Abs 0.8 (*)    All other components within normal limits  COMPREHENSIVE METABOLIC PANEL - Abnormal; Notable for the following:    Glucose, Bld 117 (*)    BUN 32 (*)    Creatinine, Ser 2.09 (*)    Calcium 8.8 (*)    GFR calc non Af Amer 27 (*)    GFR calc Af Amer 32 (*)    All other components within normal limits  PROTIME-INR  APTT  TROPONIN I  CBG MONITORING, ED   ____________________________________________  EKG  ED ECG REPORT I, Larae Grooms  M, the attending physician, personally viewed and interpreted this ECG.   Date: 04/22/2017  EKG Time: 1459  Rate: 70  Rhythm: EKG machine read as atrial fibrillation. However, there are [place] be P waves in leads 2 as well as V3  Axis: normal  Intervals:irbbb and lafb  ST&T Change: No ST segment elevation or depression. No abnormal T-wave inversion.  ____________________________________________  [id number]  CT Head Code Stroke W/O CM (Final result)  Result time 04/22/17 14:53:27  Final result by Gilford Silvius, MD (04/22/17 14:53:27)           Narrative:   CLINICAL DATA: Code stroke. Dizziness and headache with double vision starting at 12:30 today.  EXAM: CT HEAD WITHOUT CONTRAST  TECHNIQUE: Contiguous axial images were obtained from the base of the skull through the vertex without intravenous contrast.  COMPARISON: Brain MRI 10/11/2015  FINDINGS: Brain: No evidence of acute infarction, hemorrhage, hydrocephalus, extra-axial collection or mass lesion/mass effect.  Remote  lacunar infarcts in the bilateral thalamus and left caudate head.  Multiple remote small vessel infarcts in the bilateral cerebellum.  Mild for age generalized cerebral volume loss.  Vascular: Atherosclerotic calcification. No hyperdense vessel.  Skull: No acute or aggressive finding  Sinuses/Orbits: Bilateral cataract resection.  Other: These results were called by telephone at the time of interpretation on 04/22/2017 at 2:51 pm to Dr. Harvest Dark , who verbally acknowledged these results.  ASPECTS [facility] Stroke Program Early CT Score) -Not scored with these deficits.  IMPRESSION: 1. No acute finding. 2. Multiple remote small vessel infarcts. 3. Generalized cerebral volume loss.   Electronically Signed By: Monte Fantasia M.D. On: 04/22/2017 14:53             MR Brain Wo Contrast (Final result)  Result time 04/22/17 19:08:35  Final result by Gilford Silvius, MD (04/22/17 19:08:35)           Narrative:   CLINICAL DATA: Double vision. Left medial rectus palsy beginning today.  EXAM: MRI HEAD WITHOUT CONTRAST  MRA HEAD WITHOUT CONTRAST  MRA NECK WITHOUT CONTRAST  TECHNIQUE: Multiplanar, multiecho pulse sequences of the brain and surrounding structures were obtained without intravenous contrast. Angiographic images of the Circle of Willis were obtained using MRA technique without intravenous contrast. Angiographic images of the neck were obtained using MRA technique without intravenous contrast. Carotid stenosis measurements (when applicable) are obtained utilizing NASCET criteria, using the distal internal carotid diameter as the denominator.  COMPARISON: 10/11/2015 brain MRI  FINDINGS: MRI HEAD FINDINGS  Brain: No acute infarction, hemorrhage, hydrocephalus, extra-axial collection or mass lesion.  Moderate generalized atrophy is likely stable from 2016. Chronic small-vessel disease with remote lacunar infarcts in the  bilateral thalamus and left caudate. Multiple remote small vessel infarcts in the bilateral cerebellum. Microvascular ischemic gliosis in the cerebral white matter is mild. Remote microhemorrhage in the right cerebral white matter, nonspecific in isolation.  Vascular: Arterial findings below. Normal dural venous sinus flow voids.  Skull and upper cervical spine: Normal marrow signal. Upper cervical disc and facet degeneration.  Sinuses/Orbits: Bilateral cataract resection. No acute finding.  MRA HEAD FINDINGS  Left dominant vertebral artery, acquired. Hypoplastic right A1 segment and fetal type right PCA. No major branch occlusion or proximal flow limiting stenosis. Mild narrowing of the mid basilar. Overall mild atheromatous irregularity of bilateral MCA branches. Advanced left P2 segment narrowing that is chronic  MRA NECK FINDINGS  The arch could not be covered in the field of view. Low bifurcation of the right  common carotid artery with smooth narrowing of proximal ICA measuring up to 60% stenosis. There is atheromatous irregularity of the left ICA bulb with 25% narrowing. Where seen, the dominant left vertebral artery is smooth and patent. Right vertebral artery is not seen in the neck, chronic when compared to 2016 cervical spine MRI.  IMPRESSION: Brain MRI:  1. No acute finding. 2. Moderate atrophy and remote small vessel infarcts in the deep gray nuclei and bilateral cerebellum.  Intracranial MRA:  1. No acute finding or change from 2016. 2. High-grade atheromatous narrowing of the left P2 segment.  Neck MRA:  1. Carotid bifurcation atherosclerosis with proximal ICA narrowing measuring 60% on the right and 25% on the left. 2. 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   Electronically Signed By: Monte Fantasia M.D. On: 04/22/2017 19:08            MR Angiogram Head Wo Contrast (Final result)  Result time 04/22/17 19:08:35   Final result by Gilford Silvius, MD (04/22/17 19:08:35)           Narrative:   CLINICAL DATA: Double vision. Left medial rectus palsy beginning today.  EXAM: MRI HEAD WITHOUT CONTRAST  MRA HEAD WITHOUT CONTRAST  MRA NECK WITHOUT CONTRAST  TECHNIQUE: Multiplanar, multiecho pulse sequences of the brain and surrounding structures were obtained without intravenous contrast. Angiographic images of the Circle of Willis were obtained using MRA technique without intravenous contrast. Angiographic images of the neck were obtained using MRA technique without intravenous contrast. Carotid stenosis measurements (when applicable) are obtained utilizing NASCET criteria, using the distal internal carotid diameter as the denominator.  COMPARISON: 10/11/2015 brain MRI  FINDINGS: MRI HEAD FINDINGS  Brain: No acute infarction, hemorrhage, hydrocephalus, extra-axial collection or mass lesion.  Moderate generalized atrophy is likely stable from 2016. Chronic small-vessel disease with remote lacunar infarcts in the bilateral thalamus and left caudate. Multiple remote small vessel infarcts in the bilateral cerebellum. Microvascular ischemic gliosis in the cerebral white matter is mild. Remote microhemorrhage in the right cerebral white matter, nonspecific in isolation.  Vascular: Arterial findings below. Normal dural venous sinus flow voids.  Skull and upper cervical spine: Normal marrow signal. Upper cervical disc and facet degeneration.  Sinuses/Orbits: Bilateral cataract resection. No acute finding.  MRA HEAD FINDINGS  Left dominant vertebral artery, acquired. Hypoplastic right A1 segment and fetal type right PCA. No major branch occlusion or proximal flow limiting stenosis. Mild narrowing of the mid basilar. Overall mild atheromatous irregularity of bilateral MCA branches. Advanced left P2 segment narrowing that is chronic  MRA NECK FINDINGS  The arch could not be  covered in the field of view. Low bifurcation of the right common carotid artery with smooth narrowing of proximal ICA measuring up to 60% stenosis. There is atheromatous irregularity of the left ICA bulb with 25% narrowing. Where seen, the dominant left vertebral artery is smooth and patent. Right vertebral artery is not seen in the neck, chronic when compared to 2016 cervical spine MRI.  IMPRESSION: Brain MRI:  1. No acute finding. 2. Moderate atrophy and remote small vessel infarcts in the deep gray nuclei and bilateral cerebellum.  Intracranial MRA:  1. No acute finding or change from 2016. 2. High-grade atheromatous narrowing of the left P2 segment.  Neck MRA:  1. Carotid bifurcation atherosclerosis with proximal ICA narrowing measuring 60% on the right and 25% on the left. 2. 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   Electronically Signed By:  Monte Fantasia M.D. On: 04/22/2017 19:08            MR MRA NECK WO CONTRAST (Final result)  Result time 04/22/17 19:08:35  Final result by Gilford Silvius, MD (04/22/17 19:08:35)           Narrative:   CLINICAL DATA: Double vision. Left medial rectus palsy beginning today.  EXAM: MRI HEAD WITHOUT CONTRAST  MRA HEAD WITHOUT CONTRAST  MRA NECK WITHOUT CONTRAST  TECHNIQUE: Multiplanar, multiecho pulse sequences of the brain and surrounding structures were obtained without intravenous contrast. Angiographic images of the Circle of Willis were obtained using MRA technique without intravenous contrast. Angiographic images of the neck were obtained using MRA technique without intravenous contrast. Carotid stenosis measurements (when applicable) are obtained utilizing NASCET criteria, using the distal internal carotid diameter as the denominator.  COMPARISON: 10/11/2015 brain MRI  FINDINGS: MRI HEAD FINDINGS  Brain: No acute infarction, hemorrhage, hydrocephalus,  extra-axial collection or mass lesion.  Moderate generalized atrophy is likely stable from 2016. Chronic small-vessel disease with remote lacunar infarcts in the bilateral thalamus and left caudate. Multiple remote small vessel infarcts in the bilateral cerebellum. Microvascular ischemic gliosis in the cerebral white matter is mild. Remote microhemorrhage in the right cerebral white matter, nonspecific in isolation.  Vascular: Arterial findings below. Normal dural venous sinus flow voids.  Skull and upper cervical spine: Normal marrow signal. Upper cervical disc and facet degeneration.  Sinuses/Orbits: Bilateral cataract resection. No acute finding.  MRA HEAD FINDINGS  Left dominant vertebral artery, acquired. Hypoplastic right A1 segment and fetal type right PCA. No major branch occlusion or proximal flow limiting stenosis. Mild narrowing of the mid basilar. Overall mild atheromatous irregularity of bilateral MCA branches. Advanced left P2 segment narrowing that is chronic  MRA NECK FINDINGS  The arch could not be covered in the field of view. Low bifurcation of the right common carotid artery with smooth narrowing of proximal ICA measuring up to 60% stenosis. There is atheromatous irregularity of the left ICA bulb with 25% narrowing. Where seen, the dominant left vertebral artery is smooth and patent. Right vertebral artery is not seen in the neck, chronic when compared to 2016 cervical spine MRI.  IMPRESSION: Brain MRI:  1. No acute finding. 2. Moderate atrophy and remote small vessel infarcts in the deep gray nuclei and bilateral cerebellum.  Intracranial MRA:  1. No acute finding or change from 2016. 2. High-grade atheromatous narrowing of the left P2 segment.  Neck MRA:  1. Carotid bifurcation atherosclerosis with proximal ICA narrowing measuring 60% on the right and 25% on the left. 2. Chronic occlusion of the right vertebral artery in the  neck.   Electronically Signed By: Monte Fantasia M.D. On: 04/22/2017 19:08          ____________________________________________   PROCEDURES  Procedure(s) performed:   Procedures  Critical Care performed:   ____________________________________________   INITIAL IMPRESSION / ASSESSMENT AND PLAN / ED COURSE  Pertinent labs & imaging results that were available during my care of the patient were reviewed by me and considered in my medical decision making (see chart for details).  ----------------------------------------- 5:51 PM on 04/22/2017 -----------------------------------------  Stroke alert initially called on this patient. Evaluated by the specialist on-call neurologist who does think the patient had an acute ischemic event and offer the patient TPA but the patient does not want this medication at this time.  Patient also with acute renal failure. Pending MRI at this time to see if the patient has  any basilar artery stenosis that may require acute intervention.    ----------------------------------------- 9:33 PM on 04/22/2017 ----------------------------------------- Patient neurologically intact at this time. Full range of motion to the extraocular muscles. No double vision. I discussed the case with Dr. Patrecia Pour of interventional radiology at [facility] health recommends the patient have an angiogram. When I told the patient that he would need to be transferred for the angiogram the patient refused. He says that he would rather schedule this as an outpatient. I discussed with him that this is not the recommended plan of care and that he risks further stroke, permanent disability or death. The patient is clinically sober at this time and has capacity to make medical decisions. He understands these risks. I rediscussed the case with Dr. Patrecia Pour and he says have the patient call first thing Monday morning to schedule this for seizure. The patient understands this plan  and is willing to comply. He says he will take a daily aspirin and he has these at home. He can return at any point for any worsening or concerning symptoms.   ____________________________________________   FINAL CLINICAL IMPRESSION(S) / ED DIAGNOSES  Final diagnoses:  Blurred vision  Intranuclear ophthalmoplegia.    NEW MEDICATIONS STARTED DURING THIS VISIT:  New Prescriptions   No medications on file     Note:  This document was prepared using Dragon voice recognition software and may include unintentional dictation errors.    Orbie Pyo, MD 04/22/17 2136  Renal failure appears to be at his baseline range.   Orbie Pyo, MD 04/22/17 2136

## 2017-04-22 NOTE — ED Notes (Signed)
MD Schaevitz at bedside. 

## 2017-04-22 NOTE — ED Notes (Signed)
Patient transported to MRI 

## 2017-04-22 NOTE — ED Notes (Addendum)
Pt was in [employer] today when he began to feel "quirky". Pt drove home with difficulty and called his grandson who called the Pt's eye doctor. The eye doctor evaluated the pt and sent the pt directly to the ED. Pt is continuing to have double vision. Pt denies any pain. Pt recently lost his wife and is having a difficult time. He also states he is also experiencing anxiety and stress related to family issues.

## 2017-04-24 ENCOUNTER — Other Ambulatory Visit: Payer: Self-pay | Admitting: Radiology

## 2017-04-24 ENCOUNTER — Other Ambulatory Visit (HOSPITAL_COMMUNITY): Payer: Self-pay | Admitting: Interventional Radiology

## 2017-04-24 DIAGNOSIS — H538 Other visual disturbances: Secondary | ICD-10-CM

## 2017-04-24 DIAGNOSIS — I771 Stricture of artery: Secondary | ICD-10-CM

## 2017-04-25 ENCOUNTER — Encounter (HOSPITAL_COMMUNITY): Payer: Self-pay

## 2017-04-25 ENCOUNTER — Ambulatory Visit (HOSPITAL_COMMUNITY)
Admission: RE | Admit: 2017-04-25 | Discharge: 2017-04-25 | Disposition: A | Discharge status: Home / Self Care | Payer: Medicare Other | Source: Ambulatory Visit | Attending: Interventional Radiology | Admitting: Interventional Radiology

## 2017-04-25 DIAGNOSIS — E785 Hyperlipidemia, unspecified: Secondary | ICD-10-CM | POA: Diagnosis not present

## 2017-04-25 DIAGNOSIS — R944 Abnormal results of kidney function studies: Secondary | ICD-10-CM | POA: Insufficient documentation

## 2017-04-25 DIAGNOSIS — H538 Other visual disturbances: Secondary | ICD-10-CM

## 2017-04-25 DIAGNOSIS — I6501 Occlusion and stenosis of right vertebral artery: Secondary | ICD-10-CM | POA: Diagnosis present

## 2017-04-25 DIAGNOSIS — I10 Essential (primary) hypertension: Secondary | ICD-10-CM | POA: Insufficient documentation

## 2017-04-25 DIAGNOSIS — Z951 Presence of aortocoronary bypass graft: Secondary | ICD-10-CM | POA: Diagnosis not present

## 2017-04-25 DIAGNOSIS — Z905 Acquired absence of kidney: Secondary | ICD-10-CM | POA: Insufficient documentation

## 2017-04-25 DIAGNOSIS — I2510 Atherosclerotic heart disease of native coronary artery without angina pectoris: Secondary | ICD-10-CM | POA: Diagnosis not present

## 2017-04-25 DIAGNOSIS — Z5309 Procedure and treatment not carried out because of other contraindication: Secondary | ICD-10-CM | POA: Insufficient documentation

## 2017-04-25 DIAGNOSIS — I6523 Occlusion and stenosis of bilateral carotid arteries: Secondary | ICD-10-CM | POA: Diagnosis not present

## 2017-04-25 DIAGNOSIS — I771 Stricture of artery: Secondary | ICD-10-CM

## 2017-04-25 LAB — BASIC METABOLIC PANEL
Anion gap: 9 (ref 5–15)
BUN: 33 mg/dL — AB (ref 6–20)
CALCIUM: 8.9 mg/dL (ref 8.9–10.3)
CHLORIDE: 109 mmol/L (ref 101–111)
CO2: 22 mmol/L (ref 22–32)
CREATININE: 2.08 mg/dL — AB (ref 0.61–1.24)
GFR calc non Af Amer: 27 mL/min — ABNORMAL LOW (ref >60)
GFR, EST AFRICAN AMERICAN: 32 mL/min — AB (ref >60)
Glucose, Bld: 116 mg/dL — ABNORMAL HIGH (ref 65–99)
Potassium: 3.8 mmol/L (ref 3.5–5.1)
SODIUM: 140 mmol/L (ref 135–145)

## 2017-04-25 LAB — CBC
HCT: 30.9 % — ABNORMAL LOW (ref 39.0–52.0)
Hemoglobin: 9.9 g/dL — ABNORMAL LOW (ref 13.0–17.0)
MCH: 19.3 pg — ABNORMAL LOW (ref 26.0–34.0)
MCHC: 32 g/dL (ref 30.0–36.0)
MCV: 60.2 fL — AB (ref 78.0–100.0)
PLATELETS: 267 10*3/uL (ref 150–400)
RBC: 5.13 MIL/uL (ref 4.22–5.81)
RDW: 17.2 % — ABNORMAL HIGH (ref 11.5–15.5)
WBC: 6.8 10*3/uL (ref 4.0–10.5)

## 2017-04-25 LAB — PROTIME-INR
INR: 1.07
PROTHROMBIN TIME: 13.9 s (ref 11.4–15.2)

## 2017-04-25 LAB — APTT: APTT: 33 s (ref 24–36)

## 2017-04-25 MED ORDER — SODIUM CHLORIDE 0.9 % IV BOLUS (SEPSIS)
250.0000 mL | Freq: Once | INTRAVENOUS | Status: AC
  Administered 2017-04-25: 250 mL via INTRAVENOUS

## 2017-04-25 MED ORDER — SODIUM CHLORIDE 0.9 % IV SOLN
Freq: Once | INTRAVENOUS | Status: AC
  Administered 2017-04-25: 09:00:00 via INTRAVENOUS

## 2017-04-25 NOTE — Progress Notes (Signed)
Patient ID: Jake Garcia, male   DOB: November 09, 1929, 83 y.o.   MRN: [id number] INR. Reviewed  Patients labs with him and his daughter. Risks of stroke,potential acute renal failure with need for dialysis  given the patients abnormal creatinine  (History of  partial  nephrectomy in 2016? for  tumor) were discussed in detail.  Both patient and daughter have expressed their concern regarding the above. Patient has had no further symptoms. Plan. 1.Continue on x1 baby aspirin  per day.  2.US carotids in 4 months.  3.Shouldl the patient develop new neurologic or return of initial symptoms to call 911. Otherwise follow up after [place]. of the carotids. S.Trayonna Bachmeier MD

## 2017-04-25 NOTE — Sedation Documentation (Signed)
MD at bedside to discuss procedure risks and benefits. After Dr. Kathi Ludwig explained the risks of the kidneys vs benefits, patient and daughter decided to hold off on the procedure for today. Dr. Kathi Ludwig was aware of the creatinine being 2.08 today and bolus was given; however patient decided to cancel procedure. Patient discharged to home with daughter and all belongings.

## 2017-04-25 NOTE — H&P (Signed)
Chief Complaint: Patient was seen in consultation today for cerebral arteriogram at the request of Dr Johnny Bridge  Referring Physician(s): Dr Johnny Bridge Dr Jim Desanctis  Supervising Physician: Luanne Bras  Patient Status: [facility] - Out-pt  History of Present Illness: Jake Garcia is a 83 y.o. male   Developed diplopia x 4 hrs just few days ago. Even drove his car with one eye closed to see accurately. Was seen in ED 5/5 with these symptoms Lasted approx 4-5 hrs Diplopia resolved after MRA study Pt went home AMA to return for further evaluation today. 5/5 MRA: IMPRESSION: Brain MRI: 1. No acute finding. 2. Moderate atrophy and remote small vessel infarcts in the deep gray nuclei and bilateral cerebellum.  Intracranial MRA: 1. No acute finding or change from 2016. 2. High-grade atheromatous narrowing of the left P2 segment.  Neck MRA: 1. Carotid bifurcation atherosclerosis with proximal ICA narrowing measuring 60% on the right and 25% on the left. 2. 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  Scheduled now for cerebral arteriogram Has had NO further blurred vision nonsmoker  Past Medical History:  Diagnosis Date  . Coronary artery disease   . Hyperlipidemia   . Hypertension     Past Surgical History:  Procedure Laterality Date  . CARDIAC CATHETERIZATION  11/20/2008  . CATARACT EXTRACTION W/PHACO Right 05/10/2016   Procedure: CATARACT EXTRACTION PHACO AND INTRAOCULAR LENS PLACEMENT (IOC);  Surgeon: Birder Robson, MD;  Location: [facility] ORS;  Service: Ophthalmology;  Laterality: Right;  [place] 00:56 AP% 23.7 CDE 13.29 fluid pack lot # [id number]  . CATARACT EXTRACTION W/PHACO Left 01/10/2017   Procedure: CATARACT EXTRACTION PHACO AND INTRAOCULAR LENS PLACEMENT (IOC);  Surgeon: Birder Robson, MD;  Location: [facility] ORS;  Service: Ophthalmology;  Laterality: Left;  [place] 1:01 AP% 17.5 CDE 10.72 Fluid pack lot # [id number]  . COLON SURGERY     RESECTION   . COLONOSCOPY    . CORONARY ARTERY BYPASS GRAFT  11/24/2008  . KIDNEY SURGERY     PARTIAL RESECTION    Allergies: Patient has no known allergies.  Medications: Prior to Admission medications   Medication Sig Start Date End Date Taking? Authorizing Provider  amLODipine (NORVASC) 10 MG tablet TAKE ONE TABLET BY MOUTH ONCE DAILY Patient taking differently: Take 10mg s daily in the evening 12/20/16  Yes Gollan, Kathlene November, MD  DUREZOL 0.05 % EMUL Place 1 drop into both eyes daily.  01/03/17  Yes [provider]  rosuvastatin (CRESTOR) 10 MG tablet TAKE ONE TABLET BY MOUTH ONCE DAILY Patient taking differently: Take 10mg s daily in the evening 12/20/16  Yes Gollan, Kathlene November, MD  tamsulosin (FLOMAX) 0.4 MG CAPS capsule Take 1 capsule (0.4 mg total) by mouth 2 (two) times daily. 06/15/16  Yes McKenzie, Candee Furbish, MD     Family History  Problem Relation Age of Onset  . Family history unknown: Yes    Social History   Social History  . Marital status: Widowed    Spouse name: N/A  . Number of children: N/A  . Years of education: N/A   Social History Main Topics  . Smoking status: Never Smoker  . Smokeless tobacco: Never Used  . Alcohol use No  . Drug use: No  . Sexual activity: Not Asked   Other Topics Concern  . None   Social History Narrative  . None    Review of Systems: A 12 point ROS discussed and pertinent positives are indicated in the HPI  above.  All other systems are negative.  Review of Systems  Constitutional: Negative for activity change and fatigue.  Eyes: Positive for visual disturbance. Negative for photophobia, pain and redness.  Cardiovascular: Negative for chest pain.  Gastrointestinal: Negative for abdominal pain.  Musculoskeletal: Negative for gait problem.  Neurological: Negative for dizziness, tremors, seizures, syncope, facial asymmetry, speech difficulty, weakness, light-headedness, numbness and headaches.  Psychiatric/Behavioral: Negative for  behavioral problems and confusion.    Vital Signs: BP (!) 178/74 (BP Location: Right Arm)   Pulse 93   Temp 97.9 F (36.6 C) (Oral)   Ht 5\' 7"  (1.702 m)   Wt 155 lb (70.3 kg)   SpO2 100%   BMI 24.28 kg/m   Physical Exam  Constitutional: He is oriented to person, place, and time.  Cardiovascular: Normal rate, regular rhythm and normal heart sounds.   Pulmonary/Chest: Effort normal and breath sounds normal.  Abdominal: Soft. Bowel sounds are normal.  Musculoskeletal: Normal range of motion.  Neurological: He is alert and oriented to person, place, and time.  Skin: Skin is warm and dry.  Psychiatric: He has a normal mood and affect. His behavior is normal. Judgment and thought content normal.  Nursing note and vitals reviewed.   Mallampati Score:  MD Evaluation Airway: WNL Heart: WNL Abdomen: WNL Chest/ Lungs: WNL ASA  Classification: 2 Mallampati/Airway Score: Two  Imaging: Mr Angiogram Head Wo Contrast  Result Date: 04/22/2017 CLINICAL DATA:  Double vision. Left medial rectus palsy beginning today. EXAM: MRI HEAD WITHOUT CONTRAST MRA HEAD WITHOUT CONTRAST MRA NECK WITHOUT CONTRAST TECHNIQUE: Multiplanar, multiecho pulse sequences of the brain and surrounding structures were obtained without intravenous contrast. Angiographic images of the Circle of Willis were obtained using MRA technique without intravenous contrast. Angiographic images of the neck were obtained using MRA technique without intravenous contrast. Carotid stenosis measurements (when applicable) are obtained utilizing NASCET criteria, using the distal internal carotid diameter as the denominator. COMPARISON:  10/11/2015 brain MRI FINDINGS: MRI HEAD FINDINGS Brain: No acute infarction, hemorrhage, hydrocephalus, extra-axial collection or mass lesion. Moderate generalized atrophy is likely stable from 2016. Chronic small-vessel disease with remote lacunar infarcts in the bilateral thalamus and left caudate. Multiple  remote small vessel infarcts in the bilateral cerebellum. Microvascular ischemic gliosis in the cerebral white matter is mild. Remote microhemorrhage in the right cerebral white matter, nonspecific in isolation. Vascular: Arterial findings below. Normal dural venous sinus flow voids. Skull and upper cervical spine: Normal marrow signal. Upper cervical disc and facet degeneration. Sinuses/Orbits: Bilateral cataract resection.  No acute finding. MRA HEAD FINDINGS Left dominant vertebral artery, acquired. Hypoplastic right A1 segment and fetal type right PCA. No major branch occlusion or proximal flow limiting stenosis. Mild narrowing of the mid basilar. Overall mild atheromatous irregularity of bilateral MCA branches. Advanced left P2 segment narrowing that is chronic MRA NECK FINDINGS The arch could not be covered in the field of view. Low bifurcation of the right common carotid artery with smooth narrowing of proximal ICA measuring up to 60% stenosis. There is atheromatous irregularity of the left ICA bulb with 25% narrowing. Where seen, the dominant left vertebral artery is smooth and patent. Right vertebral artery is not seen in the neck, chronic when compared to 2016 cervical spine MRI. IMPRESSION: Brain MRI: 1. No acute finding. 2. Moderate atrophy and remote small vessel infarcts in the deep gray nuclei and bilateral cerebellum. Intracranial MRA: 1. No acute finding or change from 2016. 2. High-grade atheromatous narrowing of the left P2 segment.  Neck MRA: 1. Carotid bifurcation atherosclerosis with proximal ICA narrowing measuring 60% on the right and 25% on the left. 2. 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 Electronically Signed   By: Monte Fantasia M.D.   On: 04/22/2017 19:08   Mr Jodene Nam Neck Wo Contrast  Result Date: 04/22/2017 CLINICAL DATA:  Double vision. Left medial rectus palsy beginning today. EXAM: MRI HEAD WITHOUT CONTRAST MRA HEAD WITHOUT CONTRAST MRA NECK WITHOUT CONTRAST  TECHNIQUE: Multiplanar, multiecho pulse sequences of the brain and surrounding structures were obtained without intravenous contrast. Angiographic images of the Circle of Willis were obtained using MRA technique without intravenous contrast. Angiographic images of the neck were obtained using MRA technique without intravenous contrast. Carotid stenosis measurements (when applicable) are obtained utilizing NASCET criteria, using the distal internal carotid diameter as the denominator. COMPARISON:  10/11/2015 brain MRI FINDINGS: MRI HEAD FINDINGS Brain: No acute infarction, hemorrhage, hydrocephalus, extra-axial collection or mass lesion. Moderate generalized atrophy is likely stable from 2016. Chronic small-vessel disease with remote lacunar infarcts in the bilateral thalamus and left caudate. Multiple remote small vessel infarcts in the bilateral cerebellum. Microvascular ischemic gliosis in the cerebral white matter is mild. Remote microhemorrhage in the right cerebral white matter, nonspecific in isolation. Vascular: Arterial findings below. Normal dural venous sinus flow voids. Skull and upper cervical spine: Normal marrow signal. Upper cervical disc and facet degeneration. Sinuses/Orbits: Bilateral cataract resection.  No acute finding. MRA HEAD FINDINGS Left dominant vertebral artery, acquired. Hypoplastic right A1 segment and fetal type right PCA. No major branch occlusion or proximal flow limiting stenosis. Mild narrowing of the mid basilar. Overall mild atheromatous irregularity of bilateral MCA branches. Advanced left P2 segment narrowing that is chronic MRA NECK FINDINGS The arch could not be covered in the field of view. Low bifurcation of the right common carotid artery with smooth narrowing of proximal ICA measuring up to 60% stenosis. There is atheromatous irregularity of the left ICA bulb with 25% narrowing. Where seen, the dominant left vertebral artery is smooth and patent. Right vertebral artery is  not seen in the neck, chronic when compared to 2016 cervical spine MRI. IMPRESSION: Brain MRI: 1. No acute finding. 2. Moderate atrophy and remote small vessel infarcts in the deep gray nuclei and bilateral cerebellum. Intracranial MRA: 1. No acute finding or change from 2016. 2. High-grade atheromatous narrowing of the left P2 segment. Neck MRA: 1. Carotid bifurcation atherosclerosis with proximal ICA narrowing measuring 60% on the right and 25% on the left. 2. 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 Electronically Signed   By: Monte Fantasia M.D.   On: 04/22/2017 19:08   Mr Brain Wo Contrast  Result Date: 04/22/2017 CLINICAL DATA:  Double vision. Left medial rectus palsy beginning today. EXAM: MRI HEAD WITHOUT CONTRAST MRA HEAD WITHOUT CONTRAST MRA NECK WITHOUT CONTRAST TECHNIQUE: Multiplanar, multiecho pulse sequences of the brain and surrounding structures were obtained without intravenous contrast. Angiographic images of the Circle of Willis were obtained using MRA technique without intravenous contrast. Angiographic images of the neck were obtained using MRA technique without intravenous contrast. Carotid stenosis measurements (when applicable) are obtained utilizing NASCET criteria, using the distal internal carotid diameter as the denominator. COMPARISON:  10/11/2015 brain MRI FINDINGS: MRI HEAD FINDINGS Brain: No acute infarction, hemorrhage, hydrocephalus, extra-axial collection or mass lesion. Moderate generalized atrophy is likely stable from 2016. Chronic small-vessel disease with remote lacunar infarcts in the bilateral thalamus and left caudate. Multiple remote small vessel  infarcts in the bilateral cerebellum. Microvascular ischemic gliosis in the cerebral white matter is mild. Remote microhemorrhage in the right cerebral white matter, nonspecific in isolation. Vascular: Arterial findings below. Normal dural venous sinus flow voids. Skull and upper cervical spine: Normal  marrow signal. Upper cervical disc and facet degeneration. Sinuses/Orbits: Bilateral cataract resection.  No acute finding. MRA HEAD FINDINGS Left dominant vertebral artery, acquired. Hypoplastic right A1 segment and fetal type right PCA. No major branch occlusion or proximal flow limiting stenosis. Mild narrowing of the mid basilar. Overall mild atheromatous irregularity of bilateral MCA branches. Advanced left P2 segment narrowing that is chronic MRA NECK FINDINGS The arch could not be covered in the field of view. Low bifurcation of the right common carotid artery with smooth narrowing of proximal ICA measuring up to 60% stenosis. There is atheromatous irregularity of the left ICA bulb with 25% narrowing. Where seen, the dominant left vertebral artery is smooth and patent. Right vertebral artery is not seen in the neck, chronic when compared to 2016 cervical spine MRI. IMPRESSION: Brain MRI: 1. No acute finding. 2. Moderate atrophy and remote small vessel infarcts in the deep gray nuclei and bilateral cerebellum. Intracranial MRA: 1. No acute finding or change from 2016. 2. High-grade atheromatous narrowing of the left P2 segment. Neck MRA: 1. Carotid bifurcation atherosclerosis with proximal ICA narrowing measuring 60% on the right and 25% on the left. 2. 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 Electronically Signed   By: Monte Fantasia M.D.   On: 04/22/2017 19:08   Ct Head Code Stroke W/o Cm  Result Date: 04/22/2017 CLINICAL DATA:  Code stroke. Dizziness and headache with double vision starting at 12:30 today. EXAM: CT HEAD WITHOUT CONTRAST TECHNIQUE: Contiguous axial images were obtained from the base of the skull through the vertex without intravenous contrast. COMPARISON:  Brain MRI 10/11/2015 FINDINGS: Brain: No evidence of acute infarction, hemorrhage, hydrocephalus, extra-axial collection or mass lesion/mass effect. Remote lacunar infarcts in the bilateral thalamus and left  caudate head. Multiple remote small vessel infarcts in the bilateral cerebellum. Mild for age generalized cerebral volume loss. Vascular: Atherosclerotic calcification.  No hyperdense vessel. Skull: No acute or aggressive finding Sinuses/Orbits: Bilateral cataract resection. Other: These results were called by telephone at the time of interpretation on 04/22/2017 at 2:51 pm to Dr. Harvest Dark , who verbally acknowledged these results. ASPECTS [facility] Stroke Program Early CT Score) -Not scored with these deficits. IMPRESSION: 1. No acute finding. 2. Multiple remote small vessel infarcts. 3. Generalized cerebral volume loss. Electronically Signed   By: Monte Fantasia M.D.   On: 04/22/2017 14:53    Labs:  CBC:  Recent Labs  02/15/17 1243 04/22/17 1451 04/25/17 0844  WBC 8.9 8.6 6.8  HGB 9.9* 9.6* 9.9*  HCT 31.5* 29.8* 30.9*  PLT 283 281 267    COAGS:  Recent Labs  04/22/17 1451 04/25/17 0844  INR 1.00 1.07  APTT 33 33    BMP:  Recent Labs  04/29/16 1559 02/15/17 1243 04/22/17 1451 04/25/17 0844  NA  --  136 137 140  K  --  3.9 3.8 3.8  CL  --  108 106 109  CO2  --  19* 22 22  GLUCOSE  --  133* 117* 116*  BUN 27 38* 32* 33*  CALCIUM  --  8.7* 8.8* 8.9  CREATININE 2.06* 1.84* 2.09* 2.08*  GFRNONAA 29* 32* 27* 27*  GFRAA 33* 37* 32* 32*    LIVER FUNCTION  TESTS:  Recent Labs  02/15/17 1243 04/22/17 1451  BILITOT 0.6 0.7  AST 21 23  ALT 18 18  ALKPHOS 57 60  PROT 7.2 6.8  ALBUMIN 4.2 3.7    TUMOR MARKERS: No results for input(s): AFPTM, CEA, CA199, CHROMGRNA in the last 8760 hours.  Assessment and Plan:  Diplopia- resolved in few hrs Abnormal MRA: R ICA stenosis and L VA occlusion For cerebral arteriogram now Risks and Benefits discussed with the patient including, but not limited to bleeding, infection, vascular injury, contrast induced renal failure, stroke or even death. All of the patient's questions were answered, patient is agreeable to  proceed. Consent signed and in chart.  Thank you for this interesting consult.  I greatly enjoyed meeting ELDEAN NANNA and look forward to participating in their care.  A copy of this report was sent to the requesting provider on this date.  Electronically Signed: Monia Sabal A 04/25/2017, 9:45 AM   I spent a total of  30 Minutes   in face to face in clinical consultation, greater than 50% of which was counseling/coordinating care for cerebral arteriogram

## 2017-04-25 NOTE — Progress Notes (Signed)
Patient ID: Jake Garcia, male   DOB: 09/22/1929, 83 y.o.   MRN: [id number]   Pt was scheduled for cerebral arteriogram today in IR  Creatinine is 2.08 today Pt does not have hx renal issues Does not have a Renal MD  After discussion with Dr Estanislado Pandy regarding Cr level and risk   Of worsening renal function Pt decided against procedure today.  Rec: establishing with Renal MD for evaluation asap.  Pt agrees.

## 2017-05-16 ENCOUNTER — Other Ambulatory Visit: Payer: Medicare Other

## 2017-05-16 DIAGNOSIS — C61 Malignant neoplasm of prostate: Secondary | ICD-10-CM

## 2017-05-17 LAB — PSA: Prostate Specific Ag, Serum: 1.2 ng/mL (ref 0.0–4.0)

## 2017-05-19 ENCOUNTER — Telehealth: Payer: Self-pay

## 2017-05-19 NOTE — Telephone Encounter (Signed)
Jake Hughs, MD  Leia Alf, CMA        PSA is unchanged from last check.     Spoke to patient gave results. Patient verbalized understanding. Patient request appoint letter and PSA lab mailed. Verified address. Mailed info.

## 2017-05-22 NOTE — Telephone Encounter (Signed)
This encounter was created in error - please disregard.

## 2017-05-22 NOTE — Telephone Encounter (Deleted)
Jake Hughs, MD  Leia Alf, CMA        Please inform the Patient that his PSA is stable and not rising.    Called patient. No answer. Will try later.

## 2017-06-02 ENCOUNTER — Encounter: Payer: Self-pay | Admitting: Urology

## 2017-06-07 ENCOUNTER — Telehealth: Payer: Self-pay | Admitting: Cardiovascular Disease

## 2017-06-07 NOTE — Telephone Encounter (Signed)
It is okay if he takes Viagra from a cardiac perspective Would call his urologist for the pill, they may even have samples Urology follows him for prostate cancer and may recommend one thing over another

## 2017-06-07 NOTE — Telephone Encounter (Signed)
Spoke w/ pt.  Advised him of Dr. Gollan's recommendation.  He is appreciative and will call back w/ any further questions or concerns.  

## 2017-06-07 NOTE — Telephone Encounter (Signed)
Patient interested in taking viagra . Patient states he has recently met a girl and he is 61.   He is due for fu with gollan in November and wants to know if we can call in the rx or if he needs to be seen first for an early fu . Please call

## 2017-06-08 ENCOUNTER — Telehealth: Payer: Self-pay | Admitting: Urology

## 2017-06-08 NOTE — Telephone Encounter (Signed)
Spoke to patient. Advised he would need to come in for OV w/ MD to discuss. Pt agreed. Unable to reach front office staff to schedule. Advised pt will have scheduler to call back.

## 2017-06-08 NOTE — Telephone Encounter (Signed)
Pt would like a prescription for Viagra.  Please give him a call [phone]

## 2017-06-14 ENCOUNTER — Ambulatory Visit (INDEPENDENT_AMBULATORY_CARE_PROVIDER_SITE_OTHER): Payer: Medicare Other | Admitting: Urology

## 2017-06-14 ENCOUNTER — Encounter: Payer: Self-pay | Admitting: Urology

## 2017-06-14 VITALS — BP 180/65 | HR 71 | Ht 67.0 in | Wt 153.1 lb

## 2017-06-14 DIAGNOSIS — I639 Cerebral infarction, unspecified: Secondary | ICD-10-CM

## 2017-06-14 DIAGNOSIS — C61 Malignant neoplasm of prostate: Secondary | ICD-10-CM

## 2017-06-14 DIAGNOSIS — N529 Male erectile dysfunction, unspecified: Secondary | ICD-10-CM | POA: Diagnosis not present

## 2017-06-14 DIAGNOSIS — N400 Enlarged prostate without lower urinary tract symptoms: Secondary | ICD-10-CM | POA: Diagnosis not present

## 2017-06-14 MED ORDER — SILDENAFIL CITRATE 20 MG PO TABS: ORAL_TABLET | ORAL | 3 refills | Status: DC

## 2017-06-14 NOTE — Progress Notes (Signed)
06/14/2017 9:16 AM   Jake Garcia 04-19-1929 [id number]  Referring provider: No referring provider defined for this encounter.  Chief Complaint  Patient presents with  . Prostate Cancer    HPI: 83 year old male presents today for follow-up after undergoing a bone scan.  Gleason 4 +4=8 prostate cancer, PSA 146 at diagnosis  June 2017: CT scan demonstrates pelvic lymphadenopathy consistent with metastatic disease  Bone scan demonstrates increased uptake in the right pubic rami consistent with bone metastasis. There is an area of increased uptake in the left foot as well.  The patient is taking tamsulosin 0.8 milligrams for his obstructive voiding symptoms, he has had significant improvement in his symptoms on this medication.  ADT: July 18th 2017 -  Firmagon 215m IM  Aug 09, 2016 - Lupron 439mIM Feb 14, 2017 - Lupron 456mM  PSA: Aug 22 - 12.8 10/2016 - 1.8 01/2017 - 1.4   04/2017 - 1.2  Interval history: The patient presents today to discuss his erectile dysfunction. He was recently cleared by his cardiologist to start PDE-5 inhibitors. He recently met a new woman as wife died within the last year. He was able to obtain some risk, however it is not hard enough to penetrate. He is interested in Viagra. He has no other complaints at this time. He denies new back or bone pain.  PMH: Past Medical History:  Diagnosis Date  . Coronary artery disease   . Hyperlipidemia   . Hypertension     Surgical History: Past Surgical History:  Procedure Laterality Date  . CARDIAC CATHETERIZATION  11/20/2008  . CATARACT EXTRACTION W/PHACO Right 05/10/2016   Procedure: CATARACT EXTRACTION PHACO AND INTRAOCULAR LENS PLACEMENT (IOC);  Surgeon: WilBirder RobsonD;  Location: [facility] ORS;  Service: Ophthalmology;  Laterality: Right;  [place]:56 AP% 23.7 CDE 13.29 fluid pack lot # [id number] . CATARACT EXTRACTION W/PHACO Left 01/10/2017   Procedure: CATARACT EXTRACTION PHACO AND  INTRAOCULAR LENS PLACEMENT (IOC);  Surgeon: WilBirder RobsonD;  Location: [facility] ORS;  Service: Ophthalmology;  Laterality: Left;  [place] AP% 17.5 CDE 10.72 Fluid pack lot # [id number] . COLON SURGERY     RESECTION  . COLONOSCOPY    . CORONARY ARTERY BYPASS GRAFT  11/24/2008  . KIDNEY SURGERY     PARTIAL RESECTION    Home Medications:  Allergies as of 06/14/2017   No Known Allergies     Medication List       Accurate as of 06/14/17  9:16 AM. Always use your most recent med list.          amLODipine 10 MG tablet Commonly known as:  NORVASC TAKE ONE TABLET BY MOUTH ONCE DAILY   DUREZOL 0.05 % Emul Generic drug:  Difluprednate Place 1 drop into both eyes daily.   rosuvastatin 10 MG tablet Commonly known as:  CRESTOR TAKE ONE TABLET BY MOUTH ONCE DAILY   tamsulosin 0.4 MG Caps capsule Commonly known as:  FLOMAX Take 1 capsule (0.4 mg total) by mouth 2 (two) times daily.       Allergies: No Known Allergies  Family History: Family History  Problem Relation Age of Onset  . Family history unknown: Yes    Social History:  reports that he has never smoked. He has never used smokeless tobacco. He reports that he does not drink alcohol or use drugs.  ROS: UROLOGY Frequent Urination?: Yes Hard to postpone urination?: No Burning/pain with urination?: No Get up at night to urinate?: Yes  Leakage of urine?: No Urine stream starts and stops?: No Trouble starting stream?: No Do you have to strain to urinate?: No Blood in urine?: No Urinary tract infection?: No Sexually transmitted disease?: No Injury to kidneys or bladder?: No Painful intercourse?: No Weak stream?: No Erection problems?: No Penile pain?: No  Gastrointestinal Nausea?: No Vomiting?: No Indigestion/heartburn?: No Diarrhea?: No Constipation?: No  Constitutional Fever: No Night sweats?: No Weight loss?: No Fatigue?: No  Skin Skin rash/lesions?: No Itching?: No  Eyes Blurred vision?:  No Double vision?: No  Ears/Nose/Throat Sore throat?: No Sinus problems?: No  Hematologic/Lymphatic Swollen glands?: No Easy bruising?: No  Cardiovascular Leg swelling?: No Chest pain?: No  Respiratory Cough?: No Shortness of breath?: No  Endocrine Excessive thirst?: No  Musculoskeletal Back pain?: No Joint pain?: No  Neurological Headaches?: No Dizziness?: No  Psychologic Depression?: No Anxiety?: No  Physical Exam: BP (!) 180/65 (BP Location: Left Arm, Patient Position: Sitting, Cuff Size: Normal)   Pulse 71   Ht '5\' 7"'  (1.702 m)   Wt 153 lb 1.6 oz (69.4 kg)   BMI 23.98 kg/m   Constitutional:  Alert and oriented, No acute distress. HEENT: [place] AT, moist mucus membranes.  Trachea midline, no masses. Cardiovascular: No clubbing, cyanosis, or edema. Respiratory: Normal respiratory effort, no increased work of breathing. GI: Abdomen is soft, nontender, nondistended, no abdominal masses GU: No CVA tenderness.  Skin: No rashes, bruises or suspicious lesions. Lymph: No cervical or inguinal adenopathy. Neurologic: Grossly intact, no focal deficits, moving all 4 extremities. Psychiatric: Normal mood and affect.  Laboratory Data: Lab Results  Component Value Date   WBC 6.8 04/25/2017   HGB 9.9 (L) 04/25/2017   HCT 30.9 (L) 04/25/2017   MCV 60.2 (L) 04/25/2017   PLT 267 04/25/2017    Lab Results  Component Value Date   CREATININE 2.08 (H) 04/25/2017    No results found for: PSA  Lab Results  Component Value Date   TESTOSTERONE 22 (L) 08/09/2016    Lab Results  Component Value Date   HGBA1C 5.8 (H) 10/11/2015    Urinalysis    Component Value Date/Time   COLORURINE YELLOW 10/11/2015 0515   APPEARANCEUR Clear 05/23/2016 0935   LABSPEC 1.012 10/11/2015 0515   PHURINE 7.5 10/11/2015 0515   GLUCOSEU Negative 05/23/2016 0935   HGBUR NEGATIVE 10/11/2015 0515   BILIRUBINUR Negative 05/23/2016 0935   KETONESUR NEGATIVE 10/11/2015 0515   PROTEINUR 2+  (A) 05/23/2016 0935   PROTEINUR 100 (A) 10/11/2015 0515   UROBILINOGEN 0.2 10/11/2015 0515   NITRITE Negative 05/23/2016 0935   NITRITE NEGATIVE 10/11/2015 0515   LEUKOCYTESUR Negative 05/23/2016 0935      Assessment & Plan:    1. Erectile dysfunction The patient was given a prescription for generic sildenafil 1-5 tablets daily by mouth as needed. He was one this may not work for him due to his androgen deprivation therapy so obtaining erection may be difficult despite oral medications. He will follow-up to discuss how well this medication works for him at the end of August at the time of his prostate cancer follow-up.  2. BPH Continue Flomax or 0.8 mg  3. Prostate cancer Will be due for repeat 6 month Lupron shot after 08/13/2017. We'll check a PSA and testosterone prior. Currently his PSA has responded well to Lupron therapy. The patient was reminded today that he will need to be on androgen deprivation therapy for the rest of his life, which he was not previously aware of or  did not remember.  Return for after August 13, 2017 with PSA/T prior for lupron shot.  Nickie Retort, MD  [facility] [address], [place], [place] [address] [phone]

## 2017-06-28 ENCOUNTER — Emergency Department
Admission: EM | Admit: 2017-06-28 | Discharge: 2017-06-28 | Disposition: A | Discharge status: Home / Self Care | Payer: Medicare Other | Attending: Student in an Organized Health Care Education/Training Program | Admitting: Student in an Organized Health Care Education/Training Program

## 2017-06-28 ENCOUNTER — Encounter: Payer: Self-pay | Admitting: Emergency Medicine

## 2017-06-28 DIAGNOSIS — Y929 Unspecified place or not applicable: Secondary | ICD-10-CM | POA: Diagnosis not present

## 2017-06-28 DIAGNOSIS — I10 Essential (primary) hypertension: Secondary | ICD-10-CM | POA: Insufficient documentation

## 2017-06-28 DIAGNOSIS — Y939 Activity, unspecified: Secondary | ICD-10-CM | POA: Diagnosis not present

## 2017-06-28 DIAGNOSIS — S80812A Abrasion, left lower leg, initial encounter: Secondary | ICD-10-CM | POA: Diagnosis not present

## 2017-06-28 DIAGNOSIS — X58XXXA Exposure to other specified factors, initial encounter: Secondary | ICD-10-CM | POA: Diagnosis not present

## 2017-06-28 DIAGNOSIS — Z79899 Other long term (current) drug therapy: Secondary | ICD-10-CM | POA: Insufficient documentation

## 2017-06-28 DIAGNOSIS — Y999 Unspecified external cause status: Secondary | ICD-10-CM | POA: Diagnosis not present

## 2017-06-28 MED ORDER — MUPIROCIN 2 % EX OINT: TOPICAL_OINTMENT | CUTANEOUS | 0 refills | Status: DC

## 2017-06-28 NOTE — ED Triage Notes (Signed)
Pt reports "skin condition" which appears as an abrasion. Pt reports one small spot on back of right leg was bleeding this morning, since has scabbed over.

## 2017-06-28 NOTE — Discharge Instructions (Signed)
Please follow up with Dr. Lupita Dawn office to establish care.  Return for any additional complaints or concerns.

## 2017-06-28 NOTE — ED Notes (Signed)
Pt arrives to ER via POV alone. States he has had "zipper like cuts" to his bilateral shins for 3 weeks. Pt denies scratching or injury. Pt reports that he thinks he has a blood disease from a blood transfusion X 10 years ago which is causing the abrasions.

## 2017-06-28 NOTE — ED Provider Notes (Signed)
[facility] Emergency Department Provider Note    First MD Initiated Contact with Patient 06/28/17 1035     (approximate)  I have reviewed the triage vital signs and the nursing notes.   HISTORY  Chief Complaint Abrasion    HPI Jake Garcia is a 83 y.o. male presents with chief complaint of linear abrasions to his right leg with associated bleeding that started this morning. Denies any pain or fevers. States that he's been having intermittent episodes of these lesions cropping up for the past 6 years. He denies scratching. Denies any injury. Denies any drainage. Has not tried anything over-the-counter as a remedy. Does not currently have a PCP as his recent physician passed away.   Past Medical History:  Diagnosis Date  . Coronary artery disease   . Hyperlipidemia   . Hypertension    Family History  Problem Relation Age of Onset  . Family history unknown: Yes   Past Surgical History:  Procedure Laterality Date  . CARDIAC CATHETERIZATION  11/20/2008  . CATARACT EXTRACTION W/PHACO Right 05/10/2016   Procedure: CATARACT EXTRACTION PHACO AND INTRAOCULAR LENS PLACEMENT (IOC);  Surgeon: Birder Robson, MD;  Location: [facility] ORS;  Service: Ophthalmology;  Laterality: Right;  [place] 00:56 AP% 23.7 CDE 13.29 fluid pack lot # [id number]  . CATARACT EXTRACTION W/PHACO Left 01/10/2017   Procedure: CATARACT EXTRACTION PHACO AND INTRAOCULAR LENS PLACEMENT (IOC);  Surgeon: Birder Robson, MD;  Location: [facility] ORS;  Service: Ophthalmology;  Laterality: Left;  [place] 1:01 AP% 17.5 CDE 10.72 Fluid pack lot # [id number]  . COLON SURGERY     RESECTION  . COLONOSCOPY    . CORONARY ARTERY BYPASS GRAFT  11/24/2008  . KIDNEY SURGERY     PARTIAL RESECTION   Patient Active Problem List   Diagnosis Date Noted  . 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 Prostate cancer ([place]) 06/28/2016  . Elevated PSA 05/23/2016  . Nodular prostate  05/23/2016  . Gross hematuria 04/29/2016  . Prostate nodule 04/29/2016  . 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 Nocturia 04/29/2016  . Cerebral thrombosis with cerebral infarction 10/12/2015  . Central cord syndrome ([place]) 10/11/2015  . Nasal obstruction 11/22/2013  . Deflected nasal septum 11/22/2013  . Collapse of nasal valve 11/22/2013  . Bulbous nose 11/22/2013  . Carotid stenosis 06/25/2013  . Preop cardiovascular exam 06/25/2013  . Encounter for preprocedural cardiovascular examination 06/25/2013  . Carotid artery narrowing 06/25/2013  . CAD (coronary artery disease) 08/05/2011  . Hyperlipidemia 08/05/2011  . HTN (hypertension) 08/05/2011  . Chest discomfort 08/05/2011  . Arteriosclerosis of coronary artery 08/05/2011  . BP (high blood pressure) 08/05/2011      Prior to Admission medications   Medication Sig Start Date End Date Taking? Authorizing Provider  amLODipine (NORVASC) 10 MG tablet TAKE ONE TABLET BY MOUTH ONCE DAILY Patient taking differently: Take 10mg s daily in the evening 12/20/16   Gollan, Kathlene November, MD  DUREZOL 0.05 % EMUL Place 1 drop into both eyes daily.  01/03/17   [provider]  mupirocin ointment (BACTROBAN) 2 % Apply to affected area 3 times daily 06/28/17 06/28/18  Merlyn Lot, MD  rosuvastatin (CRESTOR) 10 MG tablet TAKE ONE TABLET BY MOUTH ONCE DAILY Patient not taking: Reported on 06/14/2017 12/20/16   Minna Merritts, MD  sildenafil (REVATIO) 20 MG tablet Take 1 to 5 tabs PO daily prn 06/14/17   Nickie Retort, MD  tamsulosin (FLOMAX) 0.4 MG  CAPS capsule Take 1 capsule (0.4 mg total) by mouth 2 (two) times daily. Patient not taking: Reported on 06/14/2017 06/15/16   Cleon Gustin, MD    Allergies Patient has no known allergies.    Social History Social History  Substance Use Topics  . Smoking status: Never Smoker  . Smokeless tobacco: Never Used  . Alcohol use No    Review of  Systems Patient denies headaches, rhinorrhea, blurry vision, numbness, shortness of breath, chest pain, edema, cough, abdominal pain, nausea, vomiting, diarrhea, dysuria, fevers, rashes or hallucinations unless otherwise stated above in HPI. ____________________________________________   PHYSICAL EXAM:  VITAL SIGNS: Vitals:   06/28/17 0918  BP: (!) 166/77  Pulse: 74  Resp: 15  Temp: 98.2 F (36.8 C)    Constitutional: Alert, pleasant, well appearing and in no acute distress. Eyes: Conjunctivae are normal.  Head: Atraumatic. Nose: No congestion/rhinnorhea. Mouth/Throat: Mucous membranes are moist.   Neck: No stridor. Painless ROM.  Cardiovascular: Normal rate, regular rhythm. Grossly normal heart sounds.  Good peripheral circulation. Respiratory: Normal respiratory effort.  No retractions. Lungs CTAB. Gastrointestinal: Soft and nontender. No distention. No abdominal bruits. No CVA tenderness. Musculoskeletal: No lower extremity tenderness nor edema.  No joint effusions. Neurologic:  Normal speech and language. No gross focal neurologic deficits are appreciated. No facial droop Skin:  Skin is warm, dry and intact. Scattered linear abrasion appearing wounds to the right lower extremity. No tunneling or erythema. No surrounding cellulitis. No active hemorrhage.[name] Psychiatric: Mood and affect are normal. Speech and behavior are normal.  ____________________________________________   LABS (all labs ordered are listed, but only abnormal results are displayed)  No results found for this or any previous visit (from the past 24 hour(s)). ____________________________________________  ____________________________________________   PROCEDURES  Procedure(s) performed:  Procedures    Critical Care performed: no ____________________________________________   INITIAL IMPRESSION / ASSESSMENT AND PLAN / ED COURSE  Pertinent labs & imaging results that were available during my care  of the patient were reviewed by me and considered in my medical decision making (see chart for details).  DDX: abrasion, laceration, scabies, dermatitis  Jake Garcia is a 83 y.o. who presents to the ED with above complaint. Patient hemodynamic stable and otherwise well-appearing. No evidence of active infection right now but does have scattered abrasions as described above therefore we will start patient on topical antibiotics help with healing process. Patient will also be provided referral for PCP and dermatology.  Have discussed with the patient and available family all diagnostics and treatments performed thus far and all questions were answered to the best of my ability. The patient demonstrates understanding and agreement with plan.       ____________________________________________   FINAL CLINICAL IMPRESSION(S) / ED DIAGNOSES  Final diagnoses:  Abrasion of left lower extremity, initial encounter      NEW MEDICATIONS STARTED DURING THIS VISIT:  New Prescriptions   MUPIROCIN OINTMENT (BACTROBAN) 2 %    Apply to affected area 3 times daily     Note:  This document was prepared using Dragon voice recognition software and may include unintentional dictation errors.    Merlyn Lot, MD 06/28/17 1058

## 2017-06-28 NOTE — ED Notes (Signed)
Pt will be moved to ER room 13 for further eval. Charge and primary RN aware.

## 2017-08-08 ENCOUNTER — Telehealth: Payer: Self-pay | Admitting: Cardiovascular Disease

## 2017-08-08 ENCOUNTER — Other Ambulatory Visit: Payer: Medicare Other

## 2017-08-08 DIAGNOSIS — I2510 Atherosclerotic heart disease of native coronary artery without angina pectoris: Secondary | ICD-10-CM

## 2017-08-08 DIAGNOSIS — C61 Malignant neoplasm of prostate: Secondary | ICD-10-CM | POA: Diagnosis not present

## 2017-08-08 NOTE — Telephone Encounter (Signed)
Keri from [place] calling to let us know [facility] cancelled an order accidentally for hepatic function and lipid panel .  This may need to be re ordered.

## 2017-08-08 NOTE — Telephone Encounter (Signed)
That's ok.  They were old orders. I appreciate the call, though.

## 2017-08-09 DIAGNOSIS — H43813 Vitreous degeneration, bilateral: Secondary | ICD-10-CM | POA: Diagnosis not present

## 2017-08-09 LAB — PSA: PROSTATE SPECIFIC AG, SERUM: 1.3 ng/mL (ref 0.0–4.0)

## 2017-08-09 LAB — TESTOSTERONE

## 2017-08-15 ENCOUNTER — Ambulatory Visit (INDEPENDENT_AMBULATORY_CARE_PROVIDER_SITE_OTHER): Payer: Medicaid Other | Admitting: Urology

## 2017-08-15 ENCOUNTER — Encounter: Payer: Self-pay | Admitting: Urology

## 2017-08-15 VITALS — BP 178/62 | HR 83 | Ht 67.5 in | Wt 155.4 lb

## 2017-08-15 DIAGNOSIS — C61 Malignant neoplasm of prostate: Secondary | ICD-10-CM | POA: Diagnosis not present

## 2017-08-15 DIAGNOSIS — I2510 Atherosclerotic heart disease of native coronary artery without angina pectoris: Secondary | ICD-10-CM

## 2017-08-15 NOTE — Progress Notes (Signed)
83 year old male presents today for follow-up after undergoing a bone scan.  Gleason 4 +4=8 prostate cancer, PSA 146 at diagnosis  June 2017: CT scan demonstrates pelvic lymphadenopathy consistent with metastatic disease  Bone scan demonstrates increased uptake in the right pubic rami consistent with bone metastasis. There is an area of increased uptake in the left foot as well.  The patient is taking tamsulosin 0.4 milligrams for his obstructive voiding symptoms, he has had significant improvement in his symptoms on this medication.  ADT: July 18th 2017 -  Firmagon 265m IM  Aug 09, 2016 - Lupron 482mIM Feb 14, 2017 - Lupron 4584mM   PSA: 8/18 - 1.3 Testosterone - <3 5/18 - 1.2 01/2017 - 1.4 Aug 22, 17- 12.8 10/2016 - 1.8  Interval: Patient presents today for ongoing management of his metastatic prostate cancer.  Has been doing reasonably well. Distraught by the idea that he can't get an erection - recently met a woman and not able to have intercourse with her.  Sildenafil hasn't worked.  He has strong desire/libido.  He denies any fatigue.  He is otherwise up and around, easily takes care of himself.  [facility], but he wasn't willing to pay the price.    Would rather die of prostate cancer then not be able to have sex.   Current Outpatient Prescriptions on File Prior to Visit  Medication Sig Dispense Refill  . amLODipine (NORVASC) 10 MG tablet TAKE ONE TABLET BY MOUTH ONCE DAILY (Patient taking differently: Take 59m26maily in the evening) 90 tablet 3  . DUREZOL 0.05 % EMUL Place 1 drop into both eyes daily.     . mupirocin ointment (BACTROBAN) 2 % Apply to affected area 3 times daily 22 g 0  . sildenafil (REVATIO) 20 MG tablet Take 1 to 5 tabs PO daily prn 30 tablet 3  . rosuvastatin (CRESTOR) 10 MG tablet TAKE ONE TABLET BY MOUTH ONCE DAILY (Patient not taking: Reported on 06/14/2017) 90 tablet 3  . tamsulosin  (FLOMAX) 0.4 MG CAPS capsule Take 1 capsule (0.4 mg total) by mouth 2 (two) times daily. (Patient not taking: Reported on 06/14/2017) 180 capsule 3   No current facility-administered medications on file prior to visit.    Past Medical History:  Diagnosis Date  . Coronary artery disease   . Hyperlipidemia   . Hypertension    Past Surgical History:  Procedure Laterality Date  . CARDIAC CATHETERIZATION  11/20/2008  . CATARACT EXTRACTION W/PHACO Right 05/10/2016   Procedure: CATARACT EXTRACTION PHACO AND INTRAOCULAR LENS PLACEMENT (IOC);  Surgeon: WillBirder Robson;  Location: [facility] ORS;  Service: Ophthalmology;  Laterality: Right;  [place] 0[place]56 AP% 23.7 CDE 13.29 fluid pack lot # [id number]. CATARACT EXTRACTION W/PHACO Left 01/10/2017   Procedure: CATARACT EXTRACTION PHACO AND INTRAOCULAR LENS PLACEMENT (IOC);  Surgeon: WillBirder Robson;  Location: [facility] ORS;  Service: Ophthalmology;  Laterality: Left;  US 1[place]1 AP% 17.5 CDE 10.72 Fluid pack lot # [id number]. COLON SURGERY     RESECTION  . COLONOSCOPY    . CORONARY ARTERY BYPASS GRAFT  11/24/2008  . KIDNEY SURGERY     PARTIAL RESECTION   PE: Vitals:   08/15/17 1001  BP: (!) 178/62  Pulse: 83  NAD Poor dentention  Imaging:  None new today.  Impression:  High-grade metastatic prostate cancer, metastatic to both bone and pelvic/retroperitoneal lymph nodes, responding well to ADT.  Would  like post-pone injection today and take break from ADT.   ED- would like to try injections.  Plan:  Will hold off with injections and trend his PSA, recheck it in 3 months. Will start injection therapy - return for dose finding.

## 2017-08-17 ENCOUNTER — Ambulatory Visit (INDEPENDENT_AMBULATORY_CARE_PROVIDER_SITE_OTHER): Payer: Medicare Other | Admitting: Nurse Practitioner

## 2017-08-17 ENCOUNTER — Encounter: Payer: Self-pay | Admitting: Nurse Practitioner

## 2017-08-17 VITALS — BP 175/74 | HR 70 | Temp 98.3°F | Ht 67.5 in | Wt 154.4 lb

## 2017-08-17 DIAGNOSIS — F4320 Adjustment disorder, unspecified: Secondary | ICD-10-CM

## 2017-08-17 DIAGNOSIS — Z7689 Persons encountering health services in other specified circumstances: Secondary | ICD-10-CM | POA: Diagnosis not present

## 2017-08-17 DIAGNOSIS — R4189 Other symptoms and signs involving cognitive functions and awareness: Secondary | ICD-10-CM | POA: Diagnosis not present

## 2017-08-17 DIAGNOSIS — I10 Essential (primary) hypertension: Secondary | ICD-10-CM

## 2017-08-17 DIAGNOSIS — F4325 Adjustment disorder with mixed disturbance of emotions and conduct: Secondary | ICD-10-CM | POA: Diagnosis not present

## 2017-08-17 DIAGNOSIS — F4321 Adjustment disorder with depressed mood: Secondary | ICD-10-CM

## 2017-08-17 MED ORDER — LISINOPRIL 5 MG PO TABS: 5.0000 mg | ORAL_TABLET | Freq: Every day | ORAL | 5 refills | Status: DC

## 2017-08-17 NOTE — Patient Instructions (Addendum)
[name], Thank you for coming in to clinic today.  1. For your blood pressure: - CONTINUE amlodipine 10 mg once daily   2. For your itching: - Use lotion regularly and avoid scratching. - Follow up w/ clinic if it continues.   Please schedule a follow-up appointment with Cassell Smiles, AGNP. Return in about 3 months (around 11/17/2017) for Blood Pressure, Memory.  If you have any other questions or concerns, please feel free to call the clinic or send a message through [place]. You may also schedule an earlier appointment if necessary.  You will receive a survey after today's visit either digitally by e-mail or paper by [employer]. Your experiences and feedback matter to [place].  Please respond so we know how we are doing as we provide care for you.   Cassell Smiles, DNP, AGNP-BC Adult Gerontology Nurse Practitioner [place]

## 2017-08-17 NOTE — Progress Notes (Signed)
Subjective:    Patient ID: Jake Garcia, male    DOB: April 17, 1929, 83 y.o.   MRN: [id number]  Jake Garcia is a 83 y.o. male presenting on 08/17/2017 for [facility] Provider Pt last seen by PCP many years ago.  Obtain records from specialists in [facility].   Specialists: UROLOGY: Prostate Cancer active - receiving regular treatment w/ [facility].  Initial Dx for urinary frequency (urinating 3-5x per night) and elevated PSA - 146 at DX.  Imaging June 2017 c/w bone metastasis. Tx w/ androgen deprivation therapy and PSA less than 2.0.  CARDIOLOGY: [facility] - November 2018 appointment - Pt has concern for elevated SBP today.   Grief: Pt's spouse died in 02-23-2017.  He had been married > 60 years.  Had known her since he was 83 years old. SI 2 weeks ago and now resolved per pt.  He has had another relationship w/ a male girlfriend he was hopeful would last longer.  Notes had stopped Prostate cancer treatment so he could resolve erectile dysfunction.  Now unsure this relationship will continue, but still desires companionship/intimate relationship.  Geriatric Depression Score (GDS) score = 3 /15, which suggests no depression. The patient is basically satisfied with life. The patient has kept activities and interests. The patient feels that life is empty. The patient does not get bored often. The patient is in good spirits most of the time. The patient is not concerned that something bad is going to happen. The patient feels happy most of the time. The patient does not feel helpless often. The patient prefers to go out and do new things rather than staying at home. The patient is concerned more about the memory problems than most. The patient thinks that it is wonderful to be alive now. The patient does not feel worthless at this time. The patient does not feel full of energy. The patient is hopeful. The patient feels  well off.   Hypertension - He is checking BP at home or outside of clinic.    - Current medications: amlodipine 10 mg once daily, tolerating well without side effects - Pt denies headache, lightheadedness, dizziness, changes in vision, chest tightness/pressure, palpitations, leg swelling, sudden loss of speech or loss of consciousness. - He  reports no regular exercise routine. - His diet is moderate in salt, moderate in fat, and moderate in carbohydrates.  Rarely eats away from home.  Follows Kosher diet.     Past Medical History:  Diagnosis Date  . Coronary artery disease   . Hyperlipidemia   . Hypertension    Past Surgical History:  Procedure Laterality Date  . CARDIAC CATHETERIZATION  11/20/2008  . CATARACT EXTRACTION W/PHACO Right 05/10/2016   Procedure: CATARACT EXTRACTION PHACO AND INTRAOCULAR LENS PLACEMENT (IOC);  Surgeon: Birder Robson, MD;  Location: [facility] ORS;  Service: Ophthalmology;  Laterality: Right;  [place] 00:56 AP% 23.7 CDE 13.29 fluid pack lot # [id number]  . CATARACT EXTRACTION W/PHACO Left 01/10/2017   Procedure: CATARACT EXTRACTION PHACO AND INTRAOCULAR LENS PLACEMENT (IOC);  Surgeon: Birder Robson, MD;  Location: [facility] ORS;  Service: Ophthalmology;  Laterality: Left;  [place] 1:01 AP% 17.5 CDE 10.72 Fluid pack lot # [id number]  . COLON SURGERY     RESECTION  . COLONOSCOPY    . CORONARY ARTERY BYPASS GRAFT  11/24/2008  . KIDNEY SURGERY     PARTIAL RESECTION   Social History   Social History  . Marital status:  Widowed    Spouse name: N/A  . Number of children: N/A  . Years of education: N/A   Occupational History  . Not on file.   Social History Main Topics  . Smoking status: Never Smoker  . Smokeless tobacco: Never Used  . Alcohol use No  . Drug use: No  . Sexual activity: Not on file   Other Topics Concern  . Not on file   Social History Narrative  . No narrative on file   Family History  Problem Relation Age of Onset  . Family history  unknown: Yes   Current Outpatient Prescriptions on File Prior to Visit  Medication Sig  . amLODipine (NORVASC) 10 MG tablet TAKE ONE TABLET BY MOUTH ONCE DAILY (Patient taking differently: Take 10mg s daily in the evening)  . DUREZOL 0.05 % EMUL Place 1 drop into both eyes daily.   . mupirocin ointment (BACTROBAN) 2 % Apply to affected area 3 times daily (Patient not taking: Reported on 08/17/2017)  . rosuvastatin (CRESTOR) 10 MG tablet TAKE ONE TABLET BY MOUTH ONCE DAILY (Patient not taking: Reported on 08/17/2017)  . sildenafil (REVATIO) 20 MG tablet Take 1 to 5 tabs PO daily prn (Patient not taking: Reported on 08/17/2017)  . tamsulosin (FLOMAX) 0.4 MG CAPS capsule Take 1 capsule (0.4 mg total) by mouth 2 (two) times daily. (Patient not taking: Reported on 08/17/2017)   No current facility-administered medications on file prior to visit.     Review of Systems  Constitutional: Negative.   HENT: Negative.   Eyes: Negative.   Endocrine: Negative.   Skin:       Pruritus throughout skin (arms, legs, torso)  Allergic/Immunologic: Negative.   Neurological: Negative.   Hematological: Negative.   Psychiatric/Behavioral: Negative.    Per HPI unless specifically indicated above      Objective:    BP (!) 175/74 (BP Location: Left Arm, Patient Position: Sitting, Cuff Size: Normal)   Pulse 70   Temp 98.3 F (36.8 C) (Oral)   Ht 5' 7.5" (1.715 m)   Wt 154 lb 6.4 oz (70 kg)   SpO2 100%   BMI 23.83 kg/m   Wt Readings from Last 3 Encounters:  08/17/17 154 lb 6.4 oz (70 kg)  08/15/17 155 lb 6.4 oz (70.5 kg)  06/28/17 156 lb (70.8 kg)    Physical Exam  General - healthy, well-appearing, NAD HEENT - Normocephalic, atraumatic Neck - supple, non-tender, no LAD, no thyromegaly, no carotid bruit Heart - RRR, no murmurs heard Lungs - Clear throughout all lobes, no wheezing, crackles, or rhonchi. Normal work of breathing. Extremeties - non-tender, no edema, cap refill < 2 seconds, peripheral  pulses intact +2 bilaterally Skin - warm, dry, excoriation r/t scratching pruritic skin - no rash noted Neuro - awake, alert, oriented x3, normal gait Psych - Normal mood and affect, normal behavior.  Repeats things through visit and didn't remember he had told me that story previously less than 2 minutes prior.    Results for orders placed or performed in visit on 08/08/17  Testosterone  Result Value Ref Range   Testosterone <3 (L) 264 - 916 ng/dL  PSA  Result Value Ref Range   Prostate Specific Ag, Serum 1.3 0.0 - 4.0 ng/mL      Assessment & Plan:   Problem List Items Addressed This Visit      Cardiovascular and Mediastinum   HTN (hypertension) - Primary    Uncontrolled at visit today and at prior visits w/  urology for last 6-12 months.  Currently only taking amlodipine.  No prior history of reaction to lisinopril per pt.  Plan: 1. Continue amlodipine 10 mg once daily. 2. START lisinopril 5 mg once daily (GFR 27 w/ - Cr. 2.08 04/25/17).   3. CMP in 2 weeks 4. Follow up 4 weeks.       Relevant Medications   lisinopril (PRINIVIL,ZESTRIL) 5 MG tablet   Other Relevant Orders   Comprehensive metabolic panel     Other   Adjustment disorder with mixed disturbance of emotions and conduct    Improving grief response w/o volatility at this time despite difficulty in past.  Pt now w/ improved outlook on life.  Negative for ongoing depression. Geriatric depression score 3/15.  Continues to have grief response, but is normal grief.  Plan: 1. Continue to monitor for depression.  Some of behavioral disturbance may have connection to dementia (see cognitive deficit A/P). 2. Follow up 4 weeks.      Cognitive deficits    Evidence of short term memory loss and minimal long term memory loss.  Pt has many long term memories, but is unable to recall some words and cannot remember what he told me 2-5 minutes prior.  Frequently repeats information. Pt currently lives alone and is independent w/  iADLs and ADLs. - Pt mentions money is hidden - former notes from prior encounters suggest family arguments r/t finances, pt admits to giving girlfriend "thousands of dollars."  Concern for possible financial indiscretions.   - Pt also may have symptoms of decreased inhibitions w/ prior focus on "moral lifestyle" of Judaism in past w/ sexual encounters only w/in marriage to late wife.  Recent change to desire sexual intercourse outside of marriage inconsistent w/ prior views.  Not necessarily a problem, but could indicate signs of dementia/Alzheimer's.  Plan: 1. Needs full memory/cognition eval.  Will complete MMSE and clock-draw test at next visit in 4 weeks for serial MMSE trending.  Discuss possible treatment options. 2. Consider more detailed neuropsychology eval if inconclusive MMSE eval. 3. Follow up 4 weeks.      Grief    See A/P for adjustment disorder.       Other Visit Diagnoses    Encounter to establish care       Pt w/o prior PCP.  Well connected w/ urology for prostate CA treatment.  Records in [facility]. Pt will need medicare wellness and close f/u for serial MMSE.      Meds ordered this encounter  Medications  . Zinc 30 MG CAPS    Sig: Take by mouth.    Follow up plan: Return in about 3 months (around 11/17/2017) for Blood Pressure, Memory.  Cassell Smiles, DNP, AGPCNP-BC Adult Gerontology Primary Care Nurse Practitioner [facility] 08/17/2017, 9:55 AM

## 2017-08-18 ENCOUNTER — Encounter: Payer: Self-pay | Admitting: Nurse Practitioner

## 2017-08-18 DIAGNOSIS — G3184 Mild cognitive impairment, so stated: Secondary | ICD-10-CM | POA: Insufficient documentation

## 2017-08-18 DIAGNOSIS — F4321 Adjustment disorder with depressed mood: Secondary | ICD-10-CM | POA: Insufficient documentation

## 2017-08-18 DIAGNOSIS — R4189 Other symptoms and signs involving cognitive functions and awareness: Secondary | ICD-10-CM

## 2017-08-18 NOTE — Assessment & Plan Note (Addendum)
Improving grief response w/o volatility at this time despite difficulty in past.  Pt now w/ improved outlook on life.  Negative for ongoing depression. Geriatric depression score 3/15.  Continues to have grief response, but is normal grief.  Plan: 1. Continue to monitor for depression.  Some of behavioral disturbance may have connection to dementia (see cognitive deficit A/P). 2. Follow up 4 weeks.

## 2017-08-18 NOTE — Assessment & Plan Note (Signed)
See A/P for adjustment disorder.

## 2017-08-18 NOTE — Assessment & Plan Note (Signed)
Uncontrolled at visit today and at prior visits w/ urology for last 6-12 months.  Currently only taking amlodipine.  No prior history of reaction to lisinopril per pt.  Plan: 1. Continue amlodipine 10 mg once daily. 2. START lisinopril 5 mg once daily (GFR 27 w/ - Cr. 2.08 04/25/17).   3. CMP in 2 weeks 4. Follow up 4 weeks.

## 2017-08-18 NOTE — Assessment & Plan Note (Signed)
Evidence of short term memory loss and minimal long term memory loss.  Pt has many long term memories, but is unable to recall some words and cannot remember what he told me 2-5 minutes prior.  Frequently repeats information. Pt currently lives alone and is independent w/ iADLs and ADLs. - Pt mentions money is hidden - former notes from prior encounters suggest family arguments r/t finances, pt admits to giving girlfriend "thousands of dollars."  Concern for possible financial indiscretions.   - Pt also may have symptoms of decreased inhibitions w/ prior focus on "moral lifestyle" of Judaism in past w/ sexual encounters only w/in marriage to late wife.  Recent change to desire sexual intercourse outside of marriage inconsistent w/ prior views.  Not necessarily a problem, but could indicate signs of dementia/Alzheimer's.  Plan: 1. Needs full memory/cognition eval.  Will complete MMSE and clock-draw test at next visit in 4 weeks for serial MMSE trending.  Discuss possible treatment options. 2. Consider more detailed neuropsychology eval if inconclusive MMSE eval. 3. Follow up 4 weeks.

## 2017-08-18 NOTE — Progress Notes (Signed)
I have reviewed this encounter including the documentation in this note and/or discussed this patient with the provider, Cassell Smiles, AGPCNP-BC. I am certifying that I agree with the content of this note as supervising physician.  Nobie Putnam, [facility] 08/18/2017, 12:51 PM

## 2017-08-28 ENCOUNTER — Telehealth (HOSPITAL_COMMUNITY): Payer: Self-pay

## 2017-08-28 ENCOUNTER — Other Ambulatory Visit (HOSPITAL_COMMUNITY): Payer: Self-pay | Admitting: Interventional Radiology

## 2017-08-28 NOTE — Progress Notes (Signed)
08/29/2017 10:48 AM   Jake Garcia [birth year] [id number]  Referring provider: No referring provider defined for this encounter.  No chief complaint on file.   HPI: 83 yo WM with high-grade metastatic prostate cancer, metastatic to both bone and pelvic/retroperitoneal lymph nodes holding ADT at this time and ED who presents today for Trimix titration.  Patient did not have the medication with him.    PMH: Past Medical History:  Diagnosis Date  . Coronary artery disease   . Hyperlipidemia   . Hypertension     Surgical History: Past Surgical History:  Procedure Laterality Date  . CARDIAC CATHETERIZATION  11/20/2008  . CATARACT EXTRACTION W/PHACO Right 05/10/2016   Procedure: CATARACT EXTRACTION PHACO AND INTRAOCULAR LENS PLACEMENT (IOC);  Surgeon: Birder Robson, MD;  Location: [facility] ORS;  Service: Ophthalmology;  Laterality: Right;  [place] 00:56 AP% 23.7 CDE 13.29 fluid pack lot # [id number]  . CATARACT EXTRACTION W/PHACO Left 01/10/2017   Procedure: CATARACT EXTRACTION PHACO AND INTRAOCULAR LENS PLACEMENT (IOC);  Surgeon: Birder Robson, MD;  Location: [facility] ORS;  Service: Ophthalmology;  Laterality: Left;  [place] 1:01 AP% 17.5 CDE 10.72 Fluid pack lot # [id number] H  . COLON SURGERY     RESECTION  . COLONOSCOPY    . CORONARY ARTERY BYPASS GRAFT  11/24/2008  . KIDNEY SURGERY     PARTIAL RESECTION    Home Medications:  Allergies as of 08/29/2017   No Known Allergies     Medication List       Accurate as of 08/28/17 10:48 AM. Always use your most recent med list.          amLODipine 10 MG tablet Commonly known as:  NORVASC TAKE ONE TABLET BY MOUTH ONCE DAILY   DUREZOL 0.05 % Emul Generic drug:  Difluprednate Place 1 drop into both eyes daily.   lisinopril 5 MG tablet Commonly known as:  PRINIVIL,ZESTRIL Take 1 tablet (5 mg total) by mouth daily.   mupirocin ointment 2 % Commonly known as:  BACTROBAN Apply to affected area 3 times daily   rosuvastatin 10 MG  tablet Commonly known as:  CRESTOR TAKE ONE TABLET BY MOUTH ONCE DAILY   sildenafil 20 MG tablet Commonly known as:  REVATIO Take 1 to 5 tabs PO daily prn   tamsulosin 0.4 MG Caps capsule Commonly known as:  FLOMAX Take 1 capsule (0.4 mg total) by mouth 2 (two) times daily.   Zinc 30 MG Caps Take by mouth.       Allergies: No Known Allergies  Family History: Family History  Problem Relation Age of Onset  . Family history unknown: Yes    Social History:  reports that he has never smoked. He has never used smokeless tobacco. He reports that he does not drink alcohol or use drugs.   Laboratory Data: Lab Results  Component Value Date   WBC 6.8 04/25/2017   HGB 9.9 (L) 04/25/2017   HCT 30.9 (L) 04/25/2017   MCV 60.2 (L) 04/25/2017   PLT 267 04/25/2017    Lab Results  Component Value Date   CREATININE 2.08 (H) 04/25/2017     Lab Results  Component Value Date   TESTOSTERONE <3 (L) 08/08/2017     Lab Results  Component Value Date   AST 23 04/22/2017   Lab Results  Component Value Date   ALT 18 04/22/2017   I have reviewed the labs.   Assessment & Plan:    1. Erectile dysfunction  - patient to reschedule  when he has the medication    No Follow-up on file.  These notes generated with voice recognition software. I apologize for typographical errors.  Zara Council, [facility] [address] [place], [place] [address] [phone]

## 2017-08-28 NOTE — Telephone Encounter (Signed)
Called to schedule 4 month f/u ultrasound, no answer, no vm. AW

## 2017-08-29 ENCOUNTER — Other Ambulatory Visit (HOSPITAL_COMMUNITY): Payer: Self-pay | Admitting: Interventional Radiology

## 2017-08-29 ENCOUNTER — Ambulatory Visit (INDEPENDENT_AMBULATORY_CARE_PROVIDER_SITE_OTHER): Payer: Medicare Other | Admitting: Urology

## 2017-08-29 ENCOUNTER — Telehealth (HOSPITAL_COMMUNITY): Payer: Self-pay

## 2017-08-29 DIAGNOSIS — N529 Male erectile dysfunction, unspecified: Secondary | ICD-10-CM

## 2017-08-29 DIAGNOSIS — C61 Malignant neoplasm of prostate: Secondary | ICD-10-CM

## 2017-08-29 NOTE — Telephone Encounter (Signed)
Daughter will talk to pt about schedule f/u US carotid and call back if they decide to schedule. AW

## 2017-08-29 NOTE — Progress Notes (Signed)
Patient rescheduled no medication with him.

## 2017-08-29 NOTE — Telephone Encounter (Signed)
Called to schedule US carotid, left message for daughter to return call. AW

## 2017-09-05 NOTE — Progress Notes (Signed)
09/06/2017 10:10 AM   Jake Garcia February 28, 1929 [id number]  Referring provider: [name], NP [place], [place] [address]  Chief Complaint  Patient presents with  . Erectile Dysfunction    Trimix Teaching    HPI: 83 yo WM with high-grade metastatic prostate cancer, metastatic to both bone and pelvic/retroperitoneal lymph nodes holding ADT at this time and ED who presents today for Trimix titration.  Erectile dysfunction His SHIM score is 5, which is severe ED.  Has been having difficulty with erections for ten years.   His major complaint is no erections.   His libido is preserved.  His risk factors for ED are age, prostate cancer, spinal injury, HTN, CAD, stress and anxiety.  He denies any painful erections or curvatures with his erections.   He is still having/no longer having spontaneous erections.  He has tried sildenafil in the past.       [place] Name 09/06/17 0946         SHIM: Over the last 6 months:   How do you rate your confidence that you could get and keep an erection? Very Low     When you had erections with sexual stimulation, how often were your erections hard enough for penetration (entering your partner)? Almost Never or Never     During sexual intercourse, how often were you able to maintain your erection after you had penetrated (entered) your partner? Almost Never or Never     During sexual intercourse, how difficult was it to maintain your erection to completion of intercourse? Extremely Difficult     When you attempted sexual intercourse, how often was it satisfactory for you? Almost Never or Never       SHIM Total Score   SHIM 5        Score: 1-7 Severe ED 8-11 Moderate ED 12-16 Mild-Moderate ED 17-21 Mild ED 22-25 No ED   PMH: Past Medical History:  Diagnosis Date  . Coronary artery disease   . Hyperlipidemia   . Hypertension     Surgical History: Past Surgical History:  Procedure Laterality Date  . CARDIAC  CATHETERIZATION  11/20/2008  . CATARACT EXTRACTION W/PHACO Right 05/10/2016   Procedure: CATARACT EXTRACTION PHACO AND INTRAOCULAR LENS PLACEMENT (IOC);  Surgeon: Birder Robson, MD;  Location: [facility] ORS;  Service: Ophthalmology;  Laterality: Right;  [place] 00:56 AP% 23.7 CDE 13.29 fluid pack lot # [id number]  . CATARACT EXTRACTION W/PHACO Left 01/10/2017   Procedure: CATARACT EXTRACTION PHACO AND INTRAOCULAR LENS PLACEMENT (IOC);  Surgeon: Birder Robson, MD;  Location: [facility] ORS;  Service: Ophthalmology;  Laterality: Left;  [place] 1:01 AP% 17.5 CDE 10.72 Fluid pack lot # [id number] H  . COLON SURGERY     RESECTION  . COLONOSCOPY    . CORONARY ARTERY BYPASS GRAFT  11/24/2008  . KIDNEY SURGERY     PARTIAL RESECTION    Home Medications:  Allergies as of 09/06/2017   No Known Allergies     Medication List       Accurate as of 09/06/17 10:10 AM. Always use your most recent med list.          amLODipine 10 MG tablet Commonly known as:  NORVASC TAKE ONE TABLET BY MOUTH ONCE DAILY   DUREZOL 0.05 % Emul Generic drug:  Difluprednate Place 1 drop into both eyes daily.   lisinopril 5 MG tablet Commonly known as:  PRINIVIL,ZESTRIL Take 1 tablet (5 mg total) by mouth daily.  mupirocin ointment 2 % Commonly known as:  BACTROBAN Apply to affected area 3 times daily   rosuvastatin 10 MG tablet Commonly known as:  CRESTOR TAKE ONE TABLET BY MOUTH ONCE DAILY   sildenafil 20 MG tablet Commonly known as:  REVATIO Take 1 to 5 tabs PO daily prn   tamsulosin 0.4 MG Caps capsule Commonly known as:  FLOMAX Take 1 capsule (0.4 mg total) by mouth 2 (two) times daily.   Zinc 30 MG Caps Take by mouth.       Allergies: No Known Allergies  Family History: Family History  Problem Relation Age of Onset  . Prostate cancer Neg Hx   . Kidney cancer Neg Hx   . Bladder Cancer Neg Hx     Social History:  reports that he has never smoked. He has never used smokeless tobacco. He reports that he  does not drink alcohol or use drugs.   Laboratory Data: Lab Results  Component Value Date   WBC 6.8 04/25/2017   HGB 9.9 (L) 04/25/2017   HCT 30.9 (L) 04/25/2017   MCV 60.2 (L) 04/25/2017   PLT 267 04/25/2017    Lab Results  Component Value Date   CREATININE 2.08 (H) 04/25/2017     Lab Results  Component Value Date   TESTOSTERONE <3 (L) 08/08/2017     Lab Results  Component Value Date   AST 23 04/22/2017   Lab Results  Component Value Date   ALT 18 04/22/2017   I have reviewed the labs.  UROLOGY Frequent Urination?: No Hard to postpone urination?: No Burning/pain with urination?: No Get up at night to urinate?: No Leakage of urine?: No Urine stream starts and stops?: No Trouble starting stream?: No Do you have to strain to urinate?: No Blood in urine?: No Urinary tract infection?: No Sexually transmitted disease?: No Injury to kidneys or bladder?: No Painful intercourse?: No Weak stream?: No Erection problems?: No Penile pain?: No Gastrointestinal Nausea?: No Vomiting?: No Indigestion/heartburn?: No Diarrhea?: No Constipation?: No Constitutional Fever: No Night sweats?: No Weight loss?: No Fatigue?: No Skin Skin rash/lesions?: No Itching?: No Eyes Blurred vision?: No Double vision?: No Ears/Nose/Throat Sore throat?: No Sinus problems?: No Hematologic/Lymphatic Swollen glands?: No Easy bruising?: No Cardiovascular Leg swelling?: No Chest pain?: No Respiratory Cough?: No Shortness of breath?: No Endocrine Excessive thirst?: No Musculoskeletal Back pain?: No Joint pain?: No Neurological Headaches?: No Dizziness?: No Psychologic Depression?: No Anxiety?: No  Physical exam Blood pressure (!) 197/74, pulse 71, height 5\' 7"  (1.702 m), weight 154 lb (69.9 kg). Constitutional: Well nourished. Alert and oriented, No acute distress. HEENT: [place] AT, moist mucus membranes. Trachea midline, no masses. Cardiovascular: No clubbing,  cyanosis, or edema. Respiratory: Normal respiratory effort, no increased work of breathing. Skin: No rashes, bruises or suspicious lesions. Lymph: No cervical or inguinal adenopathy. Neurologic: Grossly intact, no focal deficits, moving all 4 extremities. Psychiatric: Normal mood and affect.   Assessment & Plan:    1. Erectile dysfunction  - Triimix could not be injected today due to HTN   2. Prostate cancer  - keep appointments in 10/2017  3. HTN  - Patient is STRONGLY encouraged to follow-up with his PCP to get his blood pressure under better control prior to  rescheduling appointment for Trimix injection - advised the patient that blood pressure needs to be under 140/90 in order to safely inject Trimix medication   Return for keep appointment in 10/2017.  These notes generated with voice recognition software. I  apologize for typographical errors.  Zara Council, [facility] [address] [place], [place] [address] [phone]

## 2017-09-06 ENCOUNTER — Encounter: Payer: Self-pay | Admitting: Urology

## 2017-09-06 ENCOUNTER — Telehealth: Payer: Self-pay | Admitting: Urology

## 2017-09-06 ENCOUNTER — Ambulatory Visit (INDEPENDENT_AMBULATORY_CARE_PROVIDER_SITE_OTHER): Payer: Medicare Other | Admitting: Urology

## 2017-09-06 VITALS — BP 197/74 | HR 71 | Ht 67.0 in | Wt 154.0 lb

## 2017-09-06 DIAGNOSIS — N529 Male erectile dysfunction, unspecified: Secondary | ICD-10-CM

## 2017-09-06 DIAGNOSIS — I10 Essential (primary) hypertension: Secondary | ICD-10-CM | POA: Diagnosis not present

## 2017-09-06 DIAGNOSIS — C61 Malignant neoplasm of prostate: Secondary | ICD-10-CM | POA: Diagnosis not present

## 2017-09-06 NOTE — Telephone Encounter (Signed)
Patient called the office this afternoon stating that we are all woman and don't know what were talking about.  He couldn't understand why we could not inject Trimix this morning and I again explained to him that due to his high blood pressure we could not risk giving him priapism as the treatment for priapism causes a rise in blood pressure.    He then stated he was going to inject himself. I explained to him the dangers of priapism and without knowing his correct dosage of the Trimix, he would be a greater risk for this condition.  If he did suffer a priapism, I explained to him he could permanently damage his penis and therefore cause permanent erectile dysfunction and/or severe penile damage and even necrosis.    He stated that he did not care, that his lady friend was coming over to the house and he needed to be ready.  I again reiterated to him the dangers of priapism and also infection by injecting himself without instruction on proper technique and not knowing the correct dosage.    It was at that time he stated he was going to go to another hospital and hung up on me.

## 2017-10-21 NOTE — Progress Notes (Signed)
Cardiology Office Note  Date:  10/24/2017   ID:  Samyak, Sackmann 03-02-1929, MRN [id number]  PCP:  [name], NP   Chief Complaint  Patient presents with  . other    12 month follow up. Meds reviewed by the pt. verbally. "doing well."     HPI:  Mr. Gutman is an 83 -year-old  male with a history of  coronary artery disease, peripheral vascular disease,    bypass surgery in December 2009 at [facility],  high-grade complex stenosis of the LAD with a LIMA to the LAD which was off-pump, hyperlipidemia,  hypertension,   episode of chest pain August 2012 with cardiac catheter and stent placement.   medication noncompliance  Right carotid with less than 39% stenosis, October 2016 Suggested of high-grade right vertebral artery stenosis He presents today for follow-up of his coronary artery disease   Loss of his wife recently Died from complications of treatment for non-Hodgkin's lymphoma "Looking for a woman"  Has a rash legs back, itching with excoriation Stopped several of his medications thinking the rash was secondary to 1 of them  History of prostate cancer  Low testosterone  History of right arm, restricted movement, atrophy of the thenar muscles right hand  EKG on today's visit shows normal sinus rhythm with rate 66 bpm, nonspecific ST abnormality  Other past medical history reviewed Previous hospital admission for possible stroke, found to have 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 carotid disease unchanged from prior studies,  echocardiogram with normal ejection fraction  seen by neurosurgery and recommended to participate in physical therapy Was discharged on a statin but did not fill this prescription   At the time of his cardiac catheterization, He had a 99% mid RCA lesion with stent placed also noted to have 25% left main, LIMA to the LAD was patent with minimal LAD disease, large ramus with 30% proximal disease, small to  moderate OM 2 with 40% mid vessel disease, no LV gram performed. Stent was a Xience 3.5 x 23 mm DES stent.   echocardiogram in December 2009 showed mild LVH, diastolic dysfunction, mild TR, mildly elevated right ventricular systolic pressures.   PMH:   has a past medical history of Coronary artery disease, Hyperlipidemia, and Hypertension.  PSH:    Past Surgical History:  Procedure Laterality Date  . CARDIAC CATHETERIZATION  11/20/2008  . COLON SURGERY     RESECTION  . COLONOSCOPY    . CORONARY ARTERY BYPASS GRAFT  11/24/2008  . KIDNEY SURGERY     PARTIAL RESECTION    Current Outpatient Medications  Medication Sig Dispense Refill  . amLODipine (NORVASC) 10 MG tablet TAKE ONE TABLET BY MOUTH ONCE DAILY (Patient taking differently: Take 10mg s daily in the evening) 90 tablet 3  . lisinopril (PRINIVIL,ZESTRIL) 5 MG tablet Take 1 tablet (5 mg total) by mouth daily. 30 tablet 5  . Zinc 30 MG CAPS Take by mouth.     No current facility-administered medications for this visit.      Allergies:   Patient has no known allergies.   Social History:  The patient  reports that  has never smoked. he has never used smokeless tobacco. He reports that he does not drink alcohol or use drugs.   Family History:   family history is not on file.    Review of Systems: Review of Systems  Constitutional: Negative.   Respiratory: Negative.   Cardiovascular: Negative.   Gastrointestinal: Negative.  Musculoskeletal: Negative.        Arm weakness  Neurological: Negative.   Psychiatric/Behavioral: Negative.   All other systems reviewed and are negative.    PHYSICAL EXAM: VS:  BP 130/62 (BP Location: Left Arm, Patient Position: Sitting, Cuff Size: Normal)   Pulse 65   Ht 5' 6.5" (1.689 m)   Wt 154 lb 8 oz (70.1 kg)   BMI 24.56 kg/m  , BMI Body mass index is 24.56 kg/m.  No significant change in exam GEN: Well nourished, well developed, in no acute distress  HEENT: normal  Neck: no JVD,  carotid bruits, or masses Cardiac: RRR; no murmurs, rubs, or gallops,no edema  Respiratory:  clear to auscultation bilaterally, normal work of breathing GI: soft, nontender, nondistended, + BS MS: no deformity or atrophy , decreased range of motion of his right arm, unable to extend above his shoulder height, right hand weakness Skin: warm and dry, no rash Neuro:  Strength and sensation are intact Psych: euthymic mood, full affect    Recent Labs: 04/22/2017: ALT 18 04/25/2017: BUN 33; Creatinine, Ser 2.08; Hemoglobin 9.9; Platelets 267; Potassium 3.8; Sodium 140    Lipid Panel Lab Results  Component Value Date   CHOL 163 10/11/2015   HDL 33 (L) 10/11/2015   LDLCALC 118 (H) 10/11/2015   TRIG 59 10/11/2015      Wt Readings from Last 3 Encounters:  10/24/17 154 lb 8 oz (70.1 kg)  09/06/17 154 lb (69.9 kg)  08/17/17 154 lb 6.4 oz (70 kg)       ASSESSMENT AND PLAN:  Arteriosclerosis of coronary artery -  Denies any anginal symptoms, no further testing at this time Recommended he take low-dose aspirin with statin  Coronary artery disease involving native coronary artery of native heart with angina pectoris ([place]) - Plan: EKG 12-Lead Discussed previous history Active with no symptoms of chest pain No further testing  Cerebral thrombosis with cerebral infarction - Plan: EKG 12-Lead Recommend he take low-dose aspirin  Essential hypertension - Plan: EKG 12-Lead Blood pressure is well controlled on today's visit. No changes made to the medications.   Rash Etiology unclear, reports he is seeing dermatology in the past and was given a cream  Pure hypercholesterolemia Was previously tolerating Crestor 10 mg daily Stopped on his own Recommended he restart Crestor, unrelated to rash as symptoms have persisted by holding the medication  Stenosis of right carotid artery  evaluated October 2016 Right carotid with less than 39% stenosis, Suggested of high-grade right vertebral  artery stenosis   Total encounter time more than 25 minutes  Greater than 50% was spent in counseling and coordination of care with the patient   Disposition:   F/U  12 months   No orders of the defined types were placed in this encounter.    Signed, Esmond Plants, M.D., Ph.D. 10/24/2017  [place], [place]

## 2017-10-24 ENCOUNTER — Encounter: Payer: Self-pay | Admitting: Cardiovascular Disease

## 2017-10-24 ENCOUNTER — Ambulatory Visit (INDEPENDENT_AMBULATORY_CARE_PROVIDER_SITE_OTHER): Payer: Medicare Other | Admitting: Cardiovascular Disease

## 2017-10-24 VITALS — BP 130/62 | HR 65 | Ht 66.5 in | Wt 154.5 lb

## 2017-10-24 DIAGNOSIS — I25118 Atherosclerotic heart disease of native coronary artery with other forms of angina pectoris: Secondary | ICD-10-CM

## 2017-10-24 DIAGNOSIS — Z23 Encounter for immunization: Secondary | ICD-10-CM

## 2017-10-24 DIAGNOSIS — F4325 Adjustment disorder with mixed disturbance of emotions and conduct: Secondary | ICD-10-CM | POA: Diagnosis not present

## 2017-10-24 DIAGNOSIS — I6330 Cerebral infarction due to thrombosis of unspecified cerebral artery: Secondary | ICD-10-CM | POA: Diagnosis not present

## 2017-10-24 DIAGNOSIS — I6521 Occlusion and stenosis of right carotid artery: Secondary | ICD-10-CM

## 2017-10-24 DIAGNOSIS — I10 Essential (primary) hypertension: Secondary | ICD-10-CM

## 2017-10-24 MED ORDER — ASPIRIN EC 81 MG PO TBEC: 81.0000 mg | DELAYED_RELEASE_TABLET | Freq: Every day | ORAL | 3 refills | Status: DC

## 2017-10-24 MED ORDER — AMLODIPINE BESYLATE 10 MG PO TABS: 10.0000 mg | ORAL_TABLET | Freq: Every day | ORAL | 3 refills | Status: DC

## 2017-10-24 MED ORDER — ROSUVASTATIN CALCIUM 10 MG PO TABS: 10.0000 mg | ORAL_TABLET | Freq: Every day | ORAL | 3 refills | Status: DC

## 2017-10-24 NOTE — Patient Instructions (Signed)

## 2017-11-01 ENCOUNTER — Telehealth: Payer: Self-pay | Admitting: Nurse Practitioner

## 2017-11-01 NOTE — Telephone Encounter (Signed)
Called pt to sched for AWV with Nurse Health Advisor.  C/b #  [phone] on Skype [email or web address] if you have questions ° °

## 2017-11-14 ENCOUNTER — Other Ambulatory Visit: Payer: Self-pay

## 2017-11-16 ENCOUNTER — Ambulatory Visit: Payer: Self-pay

## 2017-11-17 ENCOUNTER — Encounter: Payer: Self-pay | Admitting: Nurse Practitioner

## 2017-11-17 ENCOUNTER — Other Ambulatory Visit: Payer: Self-pay

## 2017-11-17 ENCOUNTER — Ambulatory Visit (INDEPENDENT_AMBULATORY_CARE_PROVIDER_SITE_OTHER): Payer: Medicare Other | Admitting: Nurse Practitioner

## 2017-11-17 VITALS — BP 138/53 | HR 66 | Temp 97.9°F | Ht 66.5 in | Wt 152.6 lb

## 2017-11-17 DIAGNOSIS — I10 Essential (primary) hypertension: Secondary | ICD-10-CM

## 2017-11-17 DIAGNOSIS — S14129D Central cord syndrome at unspecified level of cervical spinal cord, subsequent encounter: Secondary | ICD-10-CM

## 2017-11-17 LAB — BASIC METABOLIC PANEL
BUN/Creatinine Ratio: 16 (calc) (ref 6–22)
BUN: 45 mg/dL — ABNORMAL HIGH (ref 7–25)
CO2: 20 mmol/L (ref 20–32)
Calcium: 9.1 mg/dL (ref 8.6–10.3)
Chloride: 113 mmol/L — ABNORMAL HIGH (ref 98–110)
Creat: 2.9 mg/dL — ABNORMAL HIGH (ref 0.70–1.11)
Glucose, Bld: 103 mg/dL (ref 65–139)
Potassium: 5.1 mmol/L (ref 3.5–5.3)
Sodium: 142 mmol/L (ref 135–146)

## 2017-11-17 MED ORDER — LISINOPRIL 5 MG PO TABS: 5.0000 mg | ORAL_TABLET | Freq: Every day | ORAL | 3 refills | Status: DC

## 2017-11-17 NOTE — Patient Instructions (Addendum)
Jake Garcia, Thank you for coming in to clinic today.  1. For your blood pressure: - I reordered your lisinopril through next year.  It is doing what we want it to to do. - Continue your followup w/ Dr. Rockey Situ  2. For your arm, I am sending you to [facility] for additional evaluation.  They will call you to make your appointment.  If you do not hear from them in the next 2 weeks, call [facility] and we will followup on that referral.  3. For your itching: - This is from folliculitis.  Avoid swimming in that pool until they correct their bacterial imbalance. - For your itching, you may use hydrocortisone cream (Cortisone-10) and you can take benadryl 25 mg once at bedtime.  Please schedule a follow-up appointment with Cassell Smiles, AGNP. Return in about 6 months (around 05/17/2018) for Blood pressure, Cholesterol AND as needed.  Please schedule medicare wellness visit 1 month..  If you have any other questions or concerns, please feel free to call the clinic or send a message through [place]. You may also schedule an earlier appointment if necessary.  You will receive a survey after today's visit either digitally by e-mail or paper by [employer]. Your experiences and feedback matter to [place].  Please respond so we know how we are doing as we provide care for you.   Cassell Smiles, DNP, AGNP-BC Adult Gerontology Nurse Practitioner [place]

## 2017-11-17 NOTE — Assessment & Plan Note (Signed)
Currently controlled today on amlodipine and lisinopril.  Recent visit with Dr. Rockey Situ cardiology revealed no medication changes.  Continues to be stable.  No recheck of kidney function after starting lisinopril.  Plan: 1. Continue amlodipine 10 mg once daily. 2. Continue lisinopril 5 mg once daily (GFR 27 w/ - Cr. 2.08 04/25/17).   3. CMP today 4. Follow up 3 months.

## 2017-11-17 NOTE — Assessment & Plan Note (Signed)
Initial onset occurred in 2016.  Patient has been lost to follow-up since discharge.  It is unclear whether he ever had a neurosurgery appointment.  He did complete physical therapy.  Patient exhibits significant weakness of bilateral shoulders greater on right than left.  Right shoulder with virtually no active range of motion.  Passive range of motion intact and without pain so low suspicion for rotator cuff injury.  Presence of scaphoid snuffbox indicates long period of time with nerve damage and non-activation of muscles.  Plan: 1.  Patient desires to have additional evaluation at this point in time because he wants return of function of his right shoulder.  Referral placed for neurosurgery [place] [facility]. 2.  Encourage patient to continue regular physical activity as tolerated. 3.  Follow-up as needed

## 2017-11-17 NOTE — Progress Notes (Signed)
Subjective:    Patient ID: Jake Garcia, male    DOB: Aug 20, 1929, 83 y.o.   MRN: [id number]  Jake Garcia is a 83 y.o. male presenting on 11/17/2017 for Hypertension (pt recently seen by his cardiologist. ); Spasms (lack of range of motioin w/ the right arm x 55mths); and Rash ("all over."  Pt feel the rash came from swimming.)   HPI Hypertension - He is not checking BP at home or outside of clinic.    - Current medications: lisinopril 5 mg once daily and amlodipine 10 mg once daily, tolerating well without side effects.  He had initial kidney function checked but has not had repeat kidney function labs after addition of lisinopril. - He is not currently symptomatic. - Pt denies headache, lightheadedness, dizziness, changes in vision, chest tightness/pressure, palpitations, leg swelling, sudden loss of speech or loss of consciousness. - He  reports an exercise routine that includes activities at [facility], 5 days per week. - His diet is moderate in salt, moderate in fat, and moderate in carbohydrates.  Limited ROM right arm Arm has not been moving in full ROM for "about a year now. My wife was alive then."  About a year ago he woke up  at 2am and couldn't move his arm. Never has had injury, never has had soreness.  Has gone through rehab.  He also saw a neurosurgeon in [place] 1 visit not continue follow through.  He was hospitalized in October 2016 and was diagnosed w/ central cord syndrome.  Prior to rehab pt states he had limitation of left arm as well, but that has improved and he now has full function.  He has also noticed a depression that has formed his on his right hand.  Rash Patient developed a rash all over his body after swimming at the [facility] [place] about 3 weeks ago.  He had been several times.  Initially he only had light rash, but it worsened w/ each visit to the pool.  He stopped going to the pool 3 weeks ago and has noticed the rash is continuing to improve.  It is continues to be very  itchy but is improving this week.  Patient has been scratching and causing open skin wounds.  He states he plans to avoid swimming at the [facility] in the future.  Social History   Tobacco Use  . Smoking status: Never Smoker  . Smokeless tobacco: Never Used  Substance Use Topics  . Alcohol use: No  . Drug use: No    Review of Systems Per HPI unless specifically indicated above     Objective:    BP (!) 138/53 (BP Location: Right Arm, Patient Position: Sitting, Cuff Size: Normal)   Pulse 66   Temp 97.9 F (36.6 C) (Oral)   Ht 5' 6.5" (1.689 m)   Wt 152 lb 9.6 oz (69.2 kg)   BMI 24.26 kg/m   Wt Readings from Last 3 Encounters:  11/17/17 152 lb 9.6 oz (69.2 kg)  10/24/17 154 lb 8 oz (70.1 kg)  09/06/17 154 lb (69.9 kg)    Physical Exam General - healthy, well-appearing, NAD HEENT - Normocephalic, atraumatic Neck - supple, non-tender, no LAD, no thyromegaly, no carotid bruit Heart - RRR, no murmurs heard Lungs - Clear throughout all lobes, no wheezing, crackles, or rhonchi. Normal work of breathing. Musculoskeltal - Bilateral Shoulders Inspection: Normal appearance musculature more prominent on L than R Palpation: Non-tender to palpation over anterior, lateral, or posterior shoulder ROM: Severely  limited AROM abduction, forward flexion, posterior flexion R worse than left.  PROM w/ greater range, but still significantly limited. Special Testing: Rotator cuff testing negative for pain with supraspinatus full can and empty can test, O'brien's negative for labral pain, Hawkin's AC impingement negative for pain Scaphoid snuff box present on right hand. Strength: Decreased strength 3/5 right and 4/5 left for flex/ext, ext rot / int rot, grip, rotator cuff str testing.  Pincer grasp reduced bilaterally, but significantly reduced on right. Neurovascular: Distally intact pulses, sensation to light touch Extremeties - non-tender, no edema, cap refill < 2 seconds, peripheral pulses intact +2  bilaterally Skin - warm, dry Neuro - awake, alert, oriented x3, normal gait Psych - Normal mood and affect, normal behavior   Results for orders placed or performed in visit on 08/08/17  Testosterone  Result Value Ref Range   Testosterone <3 (L) 264 - 916 ng/dL  PSA  Result Value Ref Range   Prostate Specific Ag, Serum 1.3 0.0 - 4.0 ng/mL      Assessment & Plan:   Problem List Items Addressed This Visit      Cardiovascular and Mediastinum   HTN (hypertension)    Currently controlled today on amlodipine and lisinopril.  Recent visit with Dr. Rockey Situ cardiology revealed no medication changes.  Continues to be stable.  No recheck of kidney function after starting lisinopril.  Plan: 1. Continue amlodipine 10 mg once daily. 2. Continue lisinopril 5 mg once daily (GFR 27 w/ - Cr. 2.08 04/25/17).   3. CMP today 4. Follow up 3 months.       Relevant Medications   lisinopril (PRINIVIL,ZESTRIL) 5 MG tablet   Other Relevant Orders   Basic Metabolic Panel (BMET)     Nervous and Auditory   Central cord syndrome ([place]) - Primary    Initial onset occurred in 2016.  Patient has been lost to follow-up since discharge.  It is unclear whether he ever had a neurosurgery appointment.  He did complete physical therapy.  Patient exhibits significant weakness of bilateral shoulders greater on right than left.  Right shoulder with virtually no active range of motion.  Passive range of motion intact and without pain so low suspicion for rotator cuff injury.  Presence of scaphoid snuffbox indicates long period of time with nerve damage and non-activation of muscles.  Plan: 1.  Patient desires to have additional evaluation at this point in time because he wants return of function of his right shoulder.  Referral placed for neurosurgery [place] [facility]. 2.  Encourage patient to continue regular physical activity as tolerated. 3.  Follow-up as needed      Relevant Orders   Ambulatory referral to  Neurosurgery      Meds ordered this encounter  Medications  . lisinopril (PRINIVIL,ZESTRIL) 5 MG tablet    Sig: Take 1 tablet (5 mg total) by mouth daily.    Dispense:  90 tablet    Refill:  3    Order Specific Question:   Supervising Provider    Answer:   Olin Hauser [[id number]]      Follow up plan: Return in about 6 months (around 05/17/2018) for Blood pressure, Cholesterol AND as needed.  Please schedule medicare wellness visit 1 month.Cassell Smiles, DNP, AGPCNP-BC Adult Gerontology Primary Care Nurse Practitioner [facility] 11/17/2017, 11:03 PM

## 2017-11-21 ENCOUNTER — Other Ambulatory Visit: Payer: Self-pay | Admitting: Nurse Practitioner

## 2017-11-21 DIAGNOSIS — I10 Essential (primary) hypertension: Secondary | ICD-10-CM

## 2017-11-21 MED ORDER — HYDROCHLOROTHIAZIDE 12.5 MG PO TABS: 12.5000 mg | ORAL_TABLET | Freq: Every day | ORAL | 1 refills | Status: DC

## 2017-11-29 ENCOUNTER — Other Ambulatory Visit: Payer: Self-pay | Admitting: Neurosurgery

## 2017-11-29 DIAGNOSIS — R29898 Other symptoms and signs involving the musculoskeletal system: Secondary | ICD-10-CM

## 2017-12-04 ENCOUNTER — Encounter: Payer: Self-pay | Admitting: Nurse Practitioner

## 2017-12-05 ENCOUNTER — Ambulatory Visit: Payer: Self-pay

## 2017-12-06 ENCOUNTER — Ambulatory Visit
Admission: RE | Admit: 2017-12-06 | Discharge: 2017-12-06 | Disposition: A | Discharge status: Home / Self Care | Payer: Medicare Other | Source: Ambulatory Visit | Attending: Neurosurgery | Admitting: Neurosurgery

## 2017-12-06 DIAGNOSIS — R29898 Other symptoms and signs involving the musculoskeletal system: Secondary | ICD-10-CM | POA: Insufficient documentation

## 2017-12-06 DIAGNOSIS — M5030 Other cervical disc degeneration, unspecified cervical region: Secondary | ICD-10-CM | POA: Insufficient documentation

## 2017-12-06 DIAGNOSIS — M4802 Spinal stenosis, cervical region: Secondary | ICD-10-CM | POA: Diagnosis not present

## 2017-12-26 ENCOUNTER — Ambulatory Visit: Payer: Self-pay

## 2018-01-09 ENCOUNTER — Ambulatory Visit (INDEPENDENT_AMBULATORY_CARE_PROVIDER_SITE_OTHER): Payer: Medicare Other

## 2018-01-09 VITALS — BP 184/64 | HR 70 | Temp 97.8°F | Ht 67.0 in | Wt 157.2 lb

## 2018-01-09 DIAGNOSIS — Z0000 Encounter for general adult medical examination without abnormal findings: Secondary | ICD-10-CM

## 2018-01-09 NOTE — Progress Notes (Signed)
Subjective:   Jake Garcia is a 84 y.o. male who presents for Medicare Annual/Subsequent preventive examination.  Review of Systems:   Cardiac Risk Factors include: hypertension;male gender;advanced age (>71men, >52 women);dyslipidemia     Objective:    Vitals: BP (!) 184/64 (BP Location: Left Arm, Patient Position: Sitting)   Pulse 70   Temp 97.8 F (36.6 C) (Oral)   Ht 5\' 7"  (1.702 m)   Wt 157 lb 3.2 oz (71.3 kg)   BMI 24.62 kg/m   Body mass index is 24.62 kg/m.  Advanced Directives 01/09/2018 06/28/2017 04/22/2017 02/15/2017 05/10/2016 05/03/2016 04/29/2016  Does Patient Have a Medical Advance Directive? No No No No No No No  Would patient like information on creating a medical advance directive? Yes (MAU/Ambulatory/Procedural Areas - Information given) No - Patient declined - - No - patient declined information No - patient declined information -    Tobacco Social History   Tobacco Use  Smoking Status Never Smoker  Smokeless Tobacco Never Used     Counseling given: Not Answered   Clinical Intake:  Pre-visit preparation completed: Yes  Pain : No/denies pain     Nutritional Status: BMI of 19-24  Normal Nutritional Risks: None  How often do you need to have someone help you when you read instructions, pamphlets, or other written materials from your doctor or pharmacy?: 1 - Never What is the last grade level you completed in school?: college  Interpreter Needed?: No  Information entered by :: Jake Hill,LPN   Past Medical History:  Diagnosis Date  . Coronary artery disease   . Hyperlipidemia   . Hypertension    Past Surgical History:  Procedure Laterality Date  . CARDIAC CATHETERIZATION  11/20/2008  . CATARACT EXTRACTION W/PHACO Right 05/10/2016   Procedure: CATARACT EXTRACTION PHACO AND INTRAOCULAR LENS PLACEMENT (IOC);  Surgeon: Birder Robson, MD;  Location: [facility] ORS;  Service: Ophthalmology;  Laterality: Right;  [place] 00:56 AP% 23.7 CDE 13.29 fluid  pack lot # [id number]  . CATARACT EXTRACTION W/PHACO Left 01/10/2017   Procedure: CATARACT EXTRACTION PHACO AND INTRAOCULAR LENS PLACEMENT (IOC);  Surgeon: Birder Robson, MD;  Location: [facility] ORS;  Service: Ophthalmology;  Laterality: Left;  [place] 1:01 AP% 17.5 CDE 10.72 Fluid pack lot # [id number]  . COLON SURGERY     RESECTION  . COLONOSCOPY    . CORONARY ARTERY BYPASS GRAFT  11/24/2008  . KIDNEY SURGERY     PARTIAL RESECTION   Family History  Problem Relation Age of Onset  . Prostate cancer Neg Hx   . Kidney cancer Neg Hx   . Bladder Cancer Neg Hx    Social History   Socioeconomic History  . Marital status: Widowed    Spouse name: None  . Number of children: None  . Years of education: None  . Highest education level: None  Social Needs  . Financial resource strain: Not hard at all  . Food insecurity - worry: Never true  . Food insecurity - inability: Never true  . Transportation needs - medical: No  . Transportation needs - non-medical: No  Occupational History  . None  Tobacco Use  . Smoking status: Never Smoker  . Smokeless tobacco: Never Used  Substance and Sexual Activity  . Alcohol use: No  . Drug use: No  . Sexual activity: None  Other Topics Concern  . None  Social History Narrative    Curator x 13 years, Environmental consultant and Patent examiner  Son is Pension scheme manager, Daughters: [name] and [place], Dillon Bjork pregnant w/ greatgrandkid on the way.    Outpatient Encounter Medications as of 01/09/2018  Medication Sig  . amLODipine (NORVASC) 10 MG tablet Take 1 tablet (10 mg total) daily by mouth.  . magnesium 30 MG tablet Take 30 mg by mouth 2 (two) times daily.  . Zinc 30 MG CAPS Take by mouth.  [name] aspirin EC 81 MG tablet Take 1 tablet (81 mg total) daily by mouth. (Patient not taking: Reported on 11/17/2017)  . hydrochlorothiazide (HYDRODIURIL) 12.5 MG tablet Take 1 tablet (12.5 mg total) by mouth daily. (Patient not  taking: Reported on 01/09/2018)  . rosuvastatin (CRESTOR) 10 MG tablet Take 1 tablet (10 mg total) daily by mouth. (Patient not taking: Reported on 11/17/2017)   No facility-administered encounter medications on file as of 01/09/2018.     Activities of Daily Living In your present state of health, do you have any difficulty performing the following activities: 01/09/2018 04/25/2017  Hearing? Y N  Vision? Y N  Comment seeing eye doctor -  Difficulty concentrating or making decisions? N N  Walking or climbing stairs? N N  Dressing or bathing? N N  Doing errands, shopping? N -  Preparing Food and eating ? N -  Using the Toilet? N -  In the past six months, have you accidently leaked urine? N -  Do you have problems with loss of bowel control? N -  Managing your Medications? N -  Managing your Finances? N -  Housekeeping or managing your Housekeeping? N -  Some recent data might be hidden    Patient Care Team: [name], NP as PCP - General (Nurse Practitioner) Minna Merritts, MD as Consulting Physician (Cardiology) Deetta Perla, MD as Consulting Physician (Neurosurgery) Laneta Simmers as Physician Assistant (Urology)   Assessment:   This is a routine wellness examination for Jake Garcia.  Exercise Activities and Dietary recommendations Current Exercise Habits: Home exercise routine, Type of exercise: strength training/weights, Time (Minutes): 30, Frequency (Times/Week): 7, Weekly Exercise (Minutes/Week): 210, Intensity: Mild, Exercise limited by: None identified  Goals    None      Fall Risk Fall Risk  01/09/2018  Falls in the past year? Yes  Number falls in past yr: 1  Injury with Fall? No   Is the patient's home free of loose throw rugs in walkways, pet beds, electrical cords, etc?   yes      Grab bars in the bathroom? no      Handrails on the stairs?   yes      Adequate lighting?   yes  Timed Get Up and Go Performed: completed in 8 seconds with no use  of assistive. Devices. Steady gait. No intervention needed at this time.   Depression Screen PHQ 2/9 Scores 01/09/2018 08/17/2017  PHQ - 2 Score 0 0    Cognitive Function     6CIT Screen 01/09/2018  What Year? 0 points  What month? 0 points  What time? 0 points  Count back from 20 0 points  Months in reverse 0 points  Repeat phrase 2 points  Total Score 2    Immunization History  Administered Date(s) Administered  . Influenza,inj,Quad PF,6+ Mos 10/24/2017  . Influenza-Unspecified 10/11/2013, 10/09/2014     Qualifies for Shingles Vaccine? Discussed shingrix vaccine  Screening Tests Health Maintenance  Topic Date Due  . OT PLAN OF CARE  [birth year]31/05/12  . TETANUS/TDAP  01/09/2019 (Originally 08/12/1948)  .  PNA vac Low Risk Adult (1 of 2 - PCV13) 01/09/2019 (Originally 08/12/1996)  . INFLUENZA VACCINE  Completed   Cancer Screenings: Lung: Low Dose CT Chest recommended if Age 52-80 years, 30 pack-year currently smoking OR have quit w/in 15years. Patient does not qualify. Colorectal: no longer required due to age  Additional Screenings:  Hepatitis B/HIV/Syphillis: not indicated Hepatitis C Screening: not indicated    Plan:    I have personally reviewed and addressed the Medicare Annual Wellness questionnaire and have noted the following in the patient's chart:  A. Medical and social history B. Use of alcohol, tobacco or illicit drugs  C. Current medications and supplements D. Functional ability and status E.  Nutritional status F.  Physical activity G. Advance directives H. List of other physicians I.  Hospitalizations, surgeries, and ER visits in previous 12 months J.  Crisp such as hearing and vision if needed, cognitive and depression L. Referrals and appointments   In addition, I have reviewed and discussed with patient certain preventive protocols, quality metrics, and best practice recommendations. A written personalized care plan for preventive  services as well as general preventive health recommendations were provided to patient.   Signed,  Tyler Aas, LPN Nurse Health Advisor   Nurse Notes: none

## 2018-01-09 NOTE — Patient Instructions (Signed)
[name] , Thank you for taking time to come for your Medicare Wellness Visit. I appreciate your ongoing commitment to your health goals. Please review the following plan we discussed and let me know if I can assist you in the future.   Screening recommendations/referrals: Colonoscopy: no longer required Recommended yearly ophthalmology/optometry visit for glaucoma screening and checkup Recommended yearly dental visit for hygiene and checkup  Vaccinations: Influenza vaccine: up to date Pneumococcal vaccine: declined Tdap vaccine: due, check with your insurance company for coverage  Shingles vaccine: due, check with your insurance company for coverage   Advanced directives: Advance directive discussed with you today. I have provided a copy for you to complete at home and have notarized. Once this is complete please bring a copy in to our office so we can scan it into your chart.  Conditions/risks identified: none  Next appointment: Follow up on 05/17/2018 at 8:00am with Lissa Merlin. Follow up in one year for your annual wellness exam.   Preventive Care 65 Years and Older, Male Preventive care refers to lifestyle choices and visits with your health care provider that can promote health and wellness. What does preventive care include?  A yearly physical exam. This is also called an annual well check.  Dental exams once or twice a year.  Routine eye exams. Ask your health care provider how often you should have your eyes checked.  Personal lifestyle choices, including:  Daily care of your teeth and gums.  Regular physical activity.  Eating a healthy diet.  Avoiding tobacco and drug use.  Limiting alcohol use.  Practicing safe sex.  Taking low doses of aspirin every day.  Taking vitamin and mineral supplements as recommended by your health care provider. What happens during an annual well check? The services and screenings done by your health care provider during  your annual well check will depend on your age, overall health, lifestyle risk factors, and family history of disease. Counseling  Your health care provider may ask you questions about your:  Alcohol use.  Tobacco use.  Drug use.  Emotional well-being.  Home and relationship well-being.  Sexual activity.  Eating habits.  History of falls.  Memory and ability to understand (cognition).  Work and work Statistician. Screening  You may have the following tests or measurements:  Height, weight, and BMI.  Blood pressure.  Lipid and cholesterol levels. These may be checked every 5 years, or more frequently if you are over 58 years old.  Skin check.  Lung cancer screening. You may have this screening every year starting at age 27 if you have a 30-pack-year history of smoking and currently smoke or have quit within the past 15 years.  Fecal occult blood test (FOBT) of the stool. You may have this test every year starting at age 16.  Flexible sigmoidoscopy or colonoscopy. You may have a sigmoidoscopy every 5 years or a colonoscopy every 10 years starting at age 56.  Prostate cancer screening. Recommendations will vary depending on your family history and other risks.  Hepatitis C blood test.  Hepatitis B blood test.  Sexually transmitted disease (STD) testing.  Diabetes screening. This is done by checking your blood sugar (glucose) after you have not eaten for a while (fasting). You may have this done every 1-3 years.  Abdominal aortic aneurysm (AAA) screening. You may need this if you are a current or former smoker.  Osteoporosis. You may be screened starting at age 31 if you are at high risk.  Talk with your health care provider about your test results, treatment options, and if necessary, the need for more tests. Vaccines  Your health care provider may recommend certain vaccines, such as:  Influenza vaccine. This is recommended every year.  Tetanus, diphtheria, and  acellular pertussis (Tdap, Td) vaccine. You may need a Td booster every 10 years.  Zoster vaccine. You may need this after age 73.  Pneumococcal 13-valent conjugate (PCV13) vaccine. One dose is recommended after age 100.  Pneumococcal polysaccharide (PPSV23) vaccine. One dose is recommended after age 66. Talk to your health care provider about which screenings and vaccines you need and how often you need them. This information is not intended to replace advice given to you by your health care provider. Make sure you discuss any questions you have with your health care provider. Document Released: 01/01/2016 Document Revised: 08/24/2016 Document Reviewed: 10/06/2015 Elsevier Interactive Patient Education  2017 [place] Prevention in the Home Falls can cause injuries. They can happen to people of all ages. There are many things you can do to make your home safe and to help prevent falls. What can I do on the outside of my home?  Regularly fix the edges of walkways and driveways and fix any cracks.  Remove anything that might make you trip as you walk through a door, such as a raised step or threshold.  Trim any bushes or trees on the path to your home.  Use bright outdoor lighting.  Clear any walking paths of anything that might make someone trip, such as rocks or tools.  Regularly check to see if handrails are loose or broken. Make sure that both sides of any steps have handrails.  Any raised decks and porches should have guardrails on the edges.  Have any leaves, snow, or ice cleared regularly.  Use sand or salt on walking paths during winter.  Clean up any spills in your garage right away. This includes oil or grease spills. What can I do in the bathroom?  Use night lights.  Install grab bars by the toilet and in the tub and shower. Do not use towel bars as grab bars.  Use non-skid mats or decals in the tub or shower.  If you need to sit down in the shower, use  a plastic, non-slip stool.  Keep the floor dry. Clean up any water that spills on the floor as soon as it happens.  Remove soap buildup in the tub or shower regularly.  Attach bath mats securely with double-sided non-slip rug tape.  Do not have throw rugs and other things on the floor that can make you trip. What can I do in the bedroom?  Use night lights.  Make sure that you have a light by your bed that is easy to reach.  Do not use any sheets or blankets that are too big for your bed. They should not hang down onto the floor.  Have a firm chair that has side arms. You can use this for support while you get dressed.  Do not have throw rugs and other things on the floor that can make you trip. What can I do in the kitchen?  Clean up any spills right away.  Avoid walking on wet floors.  Keep items that you use a lot in easy-to-reach places.  If you need to reach something above you, use a strong step stool that has a grab bar.  Keep electrical cords out of the way.  Do not use floor polish or wax that makes floors slippery. If you must use wax, use non-skid floor wax.  Do not have throw rugs and other things on the floor that can make you trip. What can I do with my stairs?  Do not leave any items on the stairs.  Make sure that there are handrails on both sides of the stairs and use them. Fix handrails that are broken or loose. Make sure that handrails are as long as the stairways.  Check any carpeting to make sure that it is firmly attached to the stairs. Fix any carpet that is loose or worn.  Avoid having throw rugs at the top or bottom of the stairs. If you do have throw rugs, attach them to the floor with carpet tape.  Make sure that you have a light switch at the top of the stairs and the bottom of the stairs. If you do not have them, ask someone to add them for you. What else can I do to help prevent falls?  Wear shoes that:  Do not have high heels.  Have  rubber bottoms.  Are comfortable and fit you well.  Are closed at the toe. Do not wear sandals.  If you use a stepladder:  Make sure that it is fully opened. Do not climb a closed stepladder.  Make sure that both sides of the stepladder are locked into place.  Ask someone to hold it for you, if possible.  Clearly mark and make sure that you can see:  Any grab bars or handrails.  First and last steps.  Where the edge of each step is.  Use tools that help you move around (mobility aids) if they are needed. These include:  Canes.  Walkers.  Scooters.  Crutches.  Turn on the lights when you go into a dark area. Replace any light bulbs as soon as they burn out.  Set up your furniture so you have a clear path. Avoid moving your furniture around.  If any of your floors are uneven, fix them.  If there are any pets around you, be aware of where they are.  Review your medicines with your doctor. Some medicines can make you feel dizzy. This can increase your chance of falling. Ask your doctor what other things that you can do to help prevent falls. This information is not intended to replace advice given to you by your health care provider. Make sure you discuss any questions you have with your health care provider. Document Released: 10/01/2009 Document Revised: 05/12/2016 Document Reviewed: 01/09/2015 Elsevier Interactive Patient Education  2017 [employer].

## 2018-01-10 ENCOUNTER — Other Ambulatory Visit: Payer: Self-pay | Admitting: Student

## 2018-01-10 DIAGNOSIS — M12811 Other specific arthropathies, not elsewhere classified, right shoulder: Secondary | ICD-10-CM

## 2018-01-17 ENCOUNTER — Ambulatory Visit
Admission: RE | Admit: 2018-01-17 | Discharge: 2018-01-17 | Disposition: A | Discharge status: Home / Self Care | Payer: Medicare Other | Source: Ambulatory Visit | Attending: Student | Admitting: Student

## 2018-01-17 DIAGNOSIS — M19011 Primary osteoarthritis, right shoulder: Secondary | ICD-10-CM | POA: Insufficient documentation

## 2018-01-17 DIAGNOSIS — M75101 Unspecified rotator cuff tear or rupture of right shoulder, not specified as traumatic: Secondary | ICD-10-CM | POA: Diagnosis not present

## 2018-01-17 DIAGNOSIS — M12811 Other specific arthropathies, not elsewhere classified, right shoulder: Secondary | ICD-10-CM

## 2018-01-17 DIAGNOSIS — M62511 Muscle wasting and atrophy, not elsewhere classified, right shoulder: Secondary | ICD-10-CM | POA: Insufficient documentation

## 2018-01-17 DIAGNOSIS — M7581 Other shoulder lesions, right shoulder: Secondary | ICD-10-CM | POA: Diagnosis not present

## 2018-04-30 ENCOUNTER — Encounter: Payer: Self-pay | Admitting: Nurse Practitioner

## 2018-04-30 ENCOUNTER — Ambulatory Visit (INDEPENDENT_AMBULATORY_CARE_PROVIDER_SITE_OTHER): Payer: Medicare Other | Admitting: Nurse Practitioner

## 2018-04-30 VITALS — BP 172/56 | HR 61 | Temp 97.9°F | Resp 16 | Ht 66.5 in | Wt 149.0 lb

## 2018-04-30 DIAGNOSIS — S50862A Insect bite (nonvenomous) of left forearm, initial encounter: Secondary | ICD-10-CM

## 2018-04-30 DIAGNOSIS — W57XXXA Bitten or stung by nonvenomous insect and other nonvenomous arthropods, initial encounter: Secondary | ICD-10-CM | POA: Diagnosis not present

## 2018-04-30 DIAGNOSIS — I10 Essential (primary) hypertension: Secondary | ICD-10-CM | POA: Diagnosis not present

## 2018-04-30 DIAGNOSIS — L739 Follicular disorder, unspecified: Secondary | ICD-10-CM

## 2018-04-30 MED ORDER — HYDROCHLOROTHIAZIDE 12.5 MG PO TABS: 12.5000 mg | ORAL_TABLET | Freq: Every day | ORAL | 1 refills | Status: DC

## 2018-04-30 NOTE — Progress Notes (Signed)
Subjective:    Patient ID: Jake Garcia, male    DOB: [birth year], 84 y.o.   MRN: [id number]  Jake Garcia is a 84 y.o. male presenting on 04/30/2018 for Rash (on back, legs)   HPI Rash Intermittently comes and goes.  It worsens after he swims. Started after he had used public swimming area at his gym.  Patient states he has had this rash for about 2 weeks.  -Patient also admits he may have insect bites over arms and legs.  Rash and insect bites are pruritic.   -He has not tried any over-the-counter creams to date.   Social History   Tobacco Use  . Smoking status: Never Smoker  . Smokeless tobacco: Never Used  Substance Use Topics  . Alcohol use: No  . Drug use: No    Review of Systems Per HPI unless specifically indicated above     Objective:    BP (!) 172/56   Pulse 61   Temp 97.9 F (36.6 C) (Oral)   Resp 16   Ht 5' 6.5" (1.689 m)   Wt 149 lb (67.6 kg)   BMI 23.69 kg/m   Wt Readings from Last 3 Encounters:  04/30/18 149 lb (67.6 kg)  01/09/18 157 lb 3.2 oz (71.3 kg)  11/17/17 152 lb 9.6 oz (69.2 kg)    Physical Exam  Constitutional: He is oriented to person, place, and time. He appears well-developed and well-nourished. No distress.  HENT:  Head: Normocephalic and atraumatic.  Cardiovascular: Normal rate, regular rhythm, S1 normal, S2 normal, normal heart sounds and intact distal pulses.  Pulmonary/Chest: Effort normal and breath sounds normal. No respiratory distress.  Neurological: He is alert and oriented to person, place, and time.  Skin: Skin is warm and dry. Rash (diffuse, urticarial rash c/w folliculitis.  Also with raised papules interspersed over arms and ankles at exposed skin c/w insect bite) noted.  Psychiatric: He has a normal mood and affect. His behavior is normal.  Vitals reviewed.    Results for orders placed or performed in visit on 17/51/02  Basic Metabolic Panel (BMET)  Result Value Ref Range   Glucose, Bld 103 65 - 139 mg/dL   BUN  45 (H) 7 - 25 mg/dL   Creat 2.90 (H) 0.70 - 1.11 mg/dL   BUN/Creatinine Ratio 16 6 - 22 (calc)   Sodium 142 135 - 146 mmol/L   Potassium 5.1 3.5 - 5.3 mmol/L   Chloride 113 (H) 98 - 110 mmol/L   CO2 20 20 - 32 mmol/L   Calcium 9.1 8.6 - 10.3 mg/dL      Assessment & Plan:   Problem List Items Addressed This Visit      Cardiovascular and Mediastinum   HTN (hypertension) Pt with hypertension on exam today.  Not on adequate medication.  Start HCTZ 12.5 mg once daily. Followup 4 weeks.    Other Visit Diagnoses    Insect bite of left forearm, initial encounter    -  Primary   Folliculitis        Pt with dermatitis from folliculitis and insect bites of multiple sites that are not infected.  Pt with excoriation marks that are also worsening appearance of rash. - Start triamcinolone ointment twice daily. - Consider exterminator for home or wearing insect spray when outdoors. - Use caution when using public pools/hot tubs. - Followup as needed if symptoms do not improve.   Meds ordered this encounter  Medications  . hydrochlorothiazide (HYDRODIURIL) 12.5  MG tablet    Sig: Take 1 tablet (12.5 mg total) by mouth daily.    Dispense:  90 tablet    Refill:  1    Order Specific Question:   Supervising Provider    Answer:   Olin Hauser [[id number]]    Follow up plan: No follow-ups on file.  Cassell Smiles, DNP, AGPCNP-BC Adult Gerontology Primary Care Nurse Practitioner [facility] 04/30/2018, 8:52 AM

## 2018-04-30 NOTE — Patient Instructions (Addendum)
Jake Garcia,   Thank you for coming in to clinic today.  1. You have insect bites. - Continue using triamcinolone ointment as needed.  2. Consider having an exterminator at your home.  Please schedule a follow-up appointment with Cassell Smiles, AGNP. Return if symptoms worsen or fail to improve.  If you have any other questions or concerns, please feel free to call the clinic or send a message through [place]. You may also schedule an earlier appointment if necessary.  You will receive a survey after today's visit either digitally by e-mail or paper by [employer]. Your experiences and feedback matter to [place].  Please respond so we know how we are doing as we provide care for you.   Cassell Smiles, DNP, AGNP-BC Adult Gerontology Nurse Practitioner [place]

## 2018-05-17 ENCOUNTER — Encounter: Payer: Self-pay | Admitting: Nurse Practitioner

## 2018-05-17 ENCOUNTER — Ambulatory Visit (INDEPENDENT_AMBULATORY_CARE_PROVIDER_SITE_OTHER): Payer: Medicare Other | Admitting: Nurse Practitioner

## 2018-05-17 ENCOUNTER — Other Ambulatory Visit: Payer: Self-pay

## 2018-05-17 VITALS — BP 166/64 | HR 67 | Temp 97.8°F | Ht 66.5 in | Wt 147.0 lb

## 2018-05-17 DIAGNOSIS — E7800 Pure hypercholesterolemia, unspecified: Secondary | ICD-10-CM | POA: Diagnosis not present

## 2018-05-17 DIAGNOSIS — I10 Essential (primary) hypertension: Secondary | ICD-10-CM | POA: Diagnosis not present

## 2018-05-17 MED ORDER — AMLODIPINE BESYLATE 10 MG PO TABS: 10.0000 mg | ORAL_TABLET | Freq: Every day | ORAL | 3 refills | Status: DC

## 2018-05-17 MED ORDER — HYDROCHLOROTHIAZIDE 25 MG PO TABS: 25.0000 mg | ORAL_TABLET | Freq: Every day | ORAL | 1 refills | Status: DC

## 2018-05-17 NOTE — Patient Instructions (Addendum)
Jake Garcia,   Thank you for coming in to clinic today.  1. For blood pressure: - Continue amlodipine 10 mg once daily. - INCREASE hydrochlorothiazide to 25 mg once daily.   2. Continue exercising regularly and eating a healthy diet.   You will be due for [place].  This means you should eat no food or drink after midnight.  Drink only water or coffee without cream/sugar on the morning of your lab visit. - Please go ahead and schedule a "Lab Only" visit in the morning at the clinic for lab draw in the next 7 days. - Your results will be available about 2-3 days after blood draw.    Please schedule a follow-up appointment with Cassell Smiles, AGNP. Return in about 3 months (around 08/17/2018) for hypertension.  If you have any other questions or concerns, please feel free to call the clinic or send a message through [place]. You may also schedule an earlier appointment if necessary.  You will receive a survey after today's visit either digitally by e-mail or paper by [employer]. Your experiences and feedback matter to [place].  Please respond so we know how we are doing as we provide care for you.   Cassell Smiles, DNP, AGNP-BC Adult Gerontology Nurse Practitioner [place]

## 2018-05-17 NOTE — Progress Notes (Signed)
Subjective:    Patient ID: Jake Garcia, male    DOB: [birth year], 84 y.o.   MRN: [id number]  Jake Garcia is a 84 y.o. male presenting on 05/17/2018 for Hypertension   HPI  Hypertension - He is checking BP at home or outside of clinic.  Readings 94/70s (checked at [employer]) - Current medications: amlodpine, tolerating well without side effects - He is not currently symptomatic. - Pt denies headache, lightheadedness, dizziness, changes in vision, chest tightness/pressure, palpitations, leg swelling, sudden loss of speech or loss of consciousness. - He  reports an exercise routine that includes walking at gym, 3-4 days per week. - His diet is moderate in salt, moderate in fat, and moderate in carbohydrates.    ASCVD - CAD without angina / Hyperlipidemia Pt does not remember any prior chest pain, but does remember he has cholesterol buildup in past. - Pt denies changes in vision, chest tightness/pressure, palpitations, shortness of breath, leg pain while walking, leg or arm weakness, and sudden loss of speech or loss of consciousness.  - No longer taking cholesterol medication - has been off several months.  Social History   Tobacco Use  . Smoking status: Never Smoker  . Smokeless tobacco: Never Used  Substance Use Topics  . Alcohol use: No  . Drug use: No    Review of Systems Per HPI unless specifically indicated above     Objective:    BP (!) 166/64 (BP Location: Right Arm, Patient Position: Sitting, Cuff Size: Normal)   Pulse 67   Temp 97.8 F (36.6 C) (Oral)   Ht 5' 6.5" (1.689 m)   Wt 147 lb (66.7 kg)   BMI 23.37 kg/m   Wt Readings from Last 3 Encounters:  05/17/18 147 lb (66.7 kg)  04/30/18 149 lb (67.6 kg)  01/09/18 157 lb 3.2 oz (71.3 kg)    Physical Exam  Constitutional: He is oriented to person, place, and time. He appears well-developed and well-nourished. No distress.  Neck: Normal range of motion. Neck supple. Carotid bruit is not present.    Cardiovascular: Normal rate, regular rhythm, S1 normal, S2 normal, normal heart sounds and intact distal pulses.  Pulses:      Carotid pulses are 2+ on the right side, and 2+ on the left side.      Radial pulses are 2+ on the right side, and 2+ on the left side.       Dorsalis pedis pulses are 1+ on the right side, and 1+ on the left side.       Posterior tibial pulses are 1+ on the right side, and 1+ on the left side.  Pulmonary/Chest: Effort normal and breath sounds normal. No respiratory distress.  Abdominal: Soft. Bowel sounds are normal. He exhibits no distension and no abdominal bruit.  Musculoskeletal: He exhibits no edema (pedal).  Neurological: He is alert and oriented to person, place, and time.  Skin: Skin is warm and dry. Capillary refill takes less than 2 seconds.  Healing of rash, but persistent excoriation marks  Psychiatric: He has a normal mood and affect. His behavior is normal.  Vitals reviewed.   Results for orders placed or performed in visit on 96/22/29  Basic Metabolic Panel (BMET)  Result Value Ref Range   Glucose, Bld 103 65 - 139 mg/dL   BUN 45 (H) 7 - 25 mg/dL   Creat 2.90 (H) 0.70 - 1.11 mg/dL   BUN/Creatinine Ratio 16 6 - 22 (calc)   Sodium  142 135 - 146 mmol/L   Potassium 5.1 3.5 - 5.3 mmol/L   Chloride 113 (H) 98 - 110 mmol/L   CO2 20 20 - 32 mmol/L   Calcium 9.1 8.6 - 10.3 mg/dL      Assessment & Plan:   Problem List Items Addressed This Visit      Cardiovascular and Mediastinum   HTN (hypertension) - Primary Uncontrolled hypertension.  BP goal < 130/80.  Pt is working on lifestyle modifications.  Taking medications tolerating well without side effects.   Plan: 1. Continue taking amlodipine 10 mg once daily - INCREASE hydrochlorothiazide to 25 mg once daily 2. Obtain labs   3. Encouraged heart healthy diet and increasing exercise to 30 minutes most days of the week. 4. Check BP 1-2 x per week at home, keep log, and bring to clinic at next  appointment. 5. Follow up 3 months and for BP check in 2 weeks.   Relevant Medications   hydrochlorothiazide (HYDRODIURIL) 25 MG tablet   amLODipine (NORVASC) 10 MG tablet   Other Relevant Orders   COMPLETE METABOLIC PANEL WITH GFR   Lipid panel   TSH     Other   Hyperlipidemia Stable today on exam.  Medications-  not taking statin medication.  Pt self discontinued this med.  Recheck labs today evaluate need. Encouraged healthy diet and active lifestyle. Followup 3 months.    Relevant Medications   hydrochlorothiazide (HYDRODIURIL) 25 MG tablet   amLODipine (NORVASC) 10 MG tablet   Other Relevant Orders   COMPLETE METABOLIC PANEL WITH GFR   Lipid panel   TSH      Meds ordered this encounter  Medications  . hydrochlorothiazide (HYDRODIURIL) 25 MG tablet    Sig: Take 1 tablet (25 mg total) by mouth daily.    Dispense:  90 tablet    Refill:  1    Order Specific Question:   Supervising Provider    Answer:   Olin Hauser [[id number]]  . amLODipine (NORVASC) 10 MG tablet    Sig: Take 1 tablet (10 mg total) by mouth daily.    Dispense:  90 tablet    Refill:  3    Order Specific Question:   Supervising Provider    Answer:   Olin Hauser [[id number]]    Follow up plan: Return in about 3 months (around 08/17/2018) for hypertension.  Cassell Smiles, DNP, AGPCNP-BC Adult Gerontology Primary Care Nurse Practitioner [facility] 05/18/2018, 8:06 AM

## 2018-05-18 ENCOUNTER — Encounter: Payer: Self-pay | Admitting: Nurse Practitioner

## 2018-05-21 ENCOUNTER — Other Ambulatory Visit: Payer: Self-pay

## 2018-05-21 DIAGNOSIS — I10 Essential (primary) hypertension: Secondary | ICD-10-CM | POA: Diagnosis not present

## 2018-05-21 DIAGNOSIS — E7800 Pure hypercholesterolemia, unspecified: Secondary | ICD-10-CM | POA: Diagnosis not present

## 2018-05-22 ENCOUNTER — Other Ambulatory Visit: Payer: Self-pay | Admitting: Nurse Practitioner

## 2018-05-22 DIAGNOSIS — N184 Chronic kidney disease, stage 4 (severe): Secondary | ICD-10-CM

## 2018-05-22 DIAGNOSIS — E7800 Pure hypercholesterolemia, unspecified: Secondary | ICD-10-CM

## 2018-05-22 DIAGNOSIS — I129 Hypertensive chronic kidney disease with stage 1 through stage 4 chronic kidney disease, or unspecified chronic kidney disease: Secondary | ICD-10-CM

## 2018-05-22 DIAGNOSIS — I10 Essential (primary) hypertension: Secondary | ICD-10-CM

## 2018-05-22 LAB — LIPID PANEL
Cholesterol: 186 mg/dL (ref <200)
HDL: 45 mg/dL (ref >40)
LDL Cholesterol (Calc): 119 mg/dL (calc) — ABNORMAL HIGH
Non-HDL Cholesterol (Calc): 141 mg/dL (calc) — ABNORMAL HIGH (ref <130)
Total CHOL/HDL Ratio: 4.1 (calc) (ref <5.0)
Triglycerides: 110 mg/dL (ref <150)

## 2018-05-22 LAB — COMPLETE METABOLIC PANEL WITH GFR
AG Ratio: 1.6 (calc) (ref 1.0–2.5)
ALT: 8 U/L — ABNORMAL LOW (ref 9–46)
AST: 16 U/L (ref 10–35)
Albumin: 4.2 g/dL (ref 3.6–5.1)
Alkaline phosphatase (APISO): 72 U/L (ref 40–115)
BUN/Creatinine Ratio: 20 (calc) (ref 6–22)
BUN: 68 mg/dL — ABNORMAL HIGH (ref 7–25)
CO2: 26 mmol/L (ref 20–32)
Calcium: 9.1 mg/dL (ref 8.6–10.3)
Chloride: 104 mmol/L (ref 98–110)
Creat: 3.36 mg/dL — ABNORMAL HIGH (ref 0.70–1.11)
GFR, Est African American: 18 mL/min/{1.73_m2} — ABNORMAL LOW (ref >=60)
GFR, Est Non African American: 16 mL/min/{1.73_m2} — ABNORMAL LOW (ref >=60)
Globulin: 2.7 g/dL (calc) (ref 1.9–3.7)
Glucose, Bld: 109 mg/dL — ABNORMAL HIGH (ref 65–99)
Potassium: 4 mmol/L (ref 3.5–5.3)
Sodium: 139 mmol/L (ref 135–146)
Total Bilirubin: 0.4 mg/dL (ref 0.2–1.2)
Total Protein: 6.9 g/dL (ref 6.1–8.1)

## 2018-05-22 LAB — TSH: TSH: 2.08 mIU/L (ref 0.40–4.50)

## 2018-05-22 MED ORDER — METOPROLOL SUCCINATE ER 25 MG PO TB24: 25.0000 mg | ORAL_TABLET | Freq: Every day | ORAL | 3 refills | Status: DC

## 2018-05-22 MED ORDER — ROSUVASTATIN CALCIUM 10 MG PO TABS: 10.0000 mg | ORAL_TABLET | Freq: Every day | ORAL | 3 refills | Status: DC

## 2018-06-20 ENCOUNTER — Other Ambulatory Visit: Payer: Self-pay | Admitting: Nephrology

## 2018-06-20 DIAGNOSIS — N184 Chronic kidney disease, stage 4 (severe): Secondary | ICD-10-CM

## 2018-07-18 ENCOUNTER — Ambulatory Visit: Payer: Medicare Other

## 2018-08-19 ENCOUNTER — Emergency Department
Admission: EM | Admit: 2018-08-19 | Discharge: 2018-08-19 | Disposition: A | Discharge status: Home / Self Care | Payer: Medicare Other | Attending: Emergency Medicine | Admitting: Emergency Medicine

## 2018-08-19 ENCOUNTER — Encounter: Payer: Self-pay | Admitting: Emergency Medicine

## 2018-08-19 ENCOUNTER — Other Ambulatory Visit: Payer: Self-pay

## 2018-08-19 DIAGNOSIS — I129 Hypertensive chronic kidney disease with stage 1 through stage 4 chronic kidney disease, or unspecified chronic kidney disease: Secondary | ICD-10-CM | POA: Insufficient documentation

## 2018-08-19 DIAGNOSIS — D649 Anemia, unspecified: Secondary | ICD-10-CM | POA: Diagnosis not present

## 2018-08-19 DIAGNOSIS — R55 Syncope and collapse: Secondary | ICD-10-CM | POA: Diagnosis not present

## 2018-08-19 DIAGNOSIS — Z79899 Other long term (current) drug therapy: Secondary | ICD-10-CM | POA: Insufficient documentation

## 2018-08-19 DIAGNOSIS — Z951 Presence of aortocoronary bypass graft: Secondary | ICD-10-CM | POA: Insufficient documentation

## 2018-08-19 DIAGNOSIS — R42 Dizziness and giddiness: Secondary | ICD-10-CM | POA: Diagnosis present

## 2018-08-19 DIAGNOSIS — N189 Chronic kidney disease, unspecified: Secondary | ICD-10-CM | POA: Diagnosis not present

## 2018-08-19 DIAGNOSIS — I2510 Atherosclerotic heart disease of native coronary artery without angina pectoris: Secondary | ICD-10-CM | POA: Insufficient documentation

## 2018-08-19 DIAGNOSIS — Z8546 Personal history of malignant neoplasm of prostate: Secondary | ICD-10-CM | POA: Insufficient documentation

## 2018-08-19 LAB — URINALYSIS, COMPLETE (UACMP) WITH MICROSCOPIC
BILIRUBIN URINE: NEGATIVE
Bacteria, UA: NONE SEEN
GLUCOSE, UA: NEGATIVE mg/dL
HGB URINE DIPSTICK: NEGATIVE
KETONES UR: NEGATIVE mg/dL
Leukocytes, UA: NEGATIVE
NITRITE: NEGATIVE
PROTEIN: 100 mg/dL — AB
Specific Gravity, Urine: 1.011 (ref 1.005–1.030)
Squamous Epithelial / LPF: NONE SEEN (ref 0–5)
pH: 5 (ref 5.0–8.0)

## 2018-08-19 LAB — BASIC METABOLIC PANEL
Anion gap: 9 (ref 5–15)
BUN: 63 mg/dL — ABNORMAL HIGH (ref 8–23)
CALCIUM: 8.2 mg/dL — AB (ref 8.9–10.3)
CO2: 19 mmol/L — AB (ref 22–32)
Chloride: 110 mmol/L (ref 98–111)
Creatinine, Ser: 3.3 mg/dL — ABNORMAL HIGH (ref 0.61–1.24)
GFR calc Af Amer: 18 mL/min — ABNORMAL LOW (ref >60)
GFR calc non Af Amer: 15 mL/min — ABNORMAL LOW (ref >60)
Glucose, Bld: 118 mg/dL — ABNORMAL HIGH (ref 70–99)
Potassium: 5.1 mmol/L (ref 3.5–5.1)
Sodium: 138 mmol/L (ref 135–145)

## 2018-08-19 LAB — CBC
HCT: 25.1 % — ABNORMAL LOW (ref 40.0–52.0)
Hemoglobin: 8.1 g/dL — ABNORMAL LOW (ref 13.0–18.0)
MCH: 19.6 pg — AB (ref 26.0–34.0)
MCHC: 32.1 g/dL (ref 32.0–36.0)
MCV: 60.9 fL — ABNORMAL LOW (ref 80.0–100.0)
Platelets: 204 10*3/uL (ref 150–440)
RBC: 4.12 MIL/uL — AB (ref 4.40–5.90)
RDW: 21 % — ABNORMAL HIGH (ref 11.5–14.5)
WBC: 8.2 10*3/uL (ref 3.8–10.6)

## 2018-08-19 LAB — TROPONIN I: Troponin I: <0.03 ng/mL (ref <0.03)

## 2018-08-19 MED ORDER — SODIUM CHLORIDE 0.9 % IV BOLUS
500.0000 mL | Freq: Once | INTRAVENOUS | Status: AC
  Administered 2018-08-19: 500 mL via INTRAVENOUS

## 2018-08-19 NOTE — Discharge Instructions (Addendum)

## 2018-08-19 NOTE — ED Notes (Signed)
Pt verbalized discharge instructions and stated no questions at this time

## 2018-08-19 NOTE — ED Notes (Signed)
Pt got really dizzy and felt he needed to vomit. Will inform MD

## 2018-08-19 NOTE — ED Notes (Signed)
Pt advised of needing urine specimen when able

## 2018-08-19 NOTE — ED Triage Notes (Signed)
Pt in line to eat, felt dizzy, passed out. Per ems pt was alert and had a heart rate in 40's. CBG 131.  Pt is a/o now, hr 47

## 2018-08-19 NOTE — ED Provider Notes (Signed)
[facility] Emergency Department Provider Note   ____________________________________________   First MD Initiated Contact with Patient 08/19/18 1414     (approximate)  I have reviewed the triage vital signs and the nursing notes.   HISTORY  Chief Complaint Loss of Consciousness    HPI Jake Garcia is a 84 y.o. male who after church was standing at a local cafeteria in line for about 30 to 40 minutes when he started feeling lightheaded.  He reported this to his girlfriend, she reports he then leaned up against the wall as he felt like he could possibly pass out and was nauseated.  She came to his side and he was able to be slowly lowered to the ground and did not strike his head.  She reports he was unconscious for about a minute, and then came to slowly.  Patient reports he feels fine now but felt very lightheaded while standing in the line and nauseated and then passed out.  No fevers or chills.  No headache.  Denies any neck pain or injury.  No numbness or weakness.  No trouble speaking.  Reports he is never passed out before, but he has over the last several weeks to months had episodes where he will feel lightheaded with standing.  Reports he thinks he got dehydrated and he normally drinks a couple bottles of water each morning but did not have anything this morning before going to church service.  Reports he feels completely normal now.  Past Medical History:  Diagnosis Date  . Coronary artery disease   . Hyperlipidemia   . Hypertension     Patient Active Problem List   Diagnosis Date Noted  . Cognitive deficits 08/18/2017  . Grief 08/18/2017  . Prostate cancer ([place]) 06/28/2016  . Elevated PSA 05/23/2016  . Prostate nodule 04/29/2016  . 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 Nocturia 04/29/2016  . Cerebral thrombosis with cerebral infarction 10/12/2015  . Central cord syndrome ([place]) 10/11/2015  . Deflected nasal  septum 11/22/2013  . Bulbous nose 11/22/2013  . Carotid stenosis 06/25/2013  . Carotid artery narrowing 06/25/2013  . CAD (coronary artery disease) 08/05/2011  . Hyperlipidemia 08/05/2011  . HTN (hypertension) 08/05/2011  . Arteriosclerosis of coronary artery 08/05/2011    Past Surgical History:  Procedure Laterality Date  . CARDIAC CATHETERIZATION  11/20/2008  . CATARACT EXTRACTION W/PHACO Right 05/10/2016   Procedure: CATARACT EXTRACTION PHACO AND INTRAOCULAR LENS PLACEMENT (IOC);  Surgeon: Birder Robson, MD;  Location: [facility] ORS;  Service: Ophthalmology;  Laterality: Right;  [place] 00:56 AP% 23.7 CDE 13.29 fluid pack lot # [id number]  . CATARACT EXTRACTION W/PHACO Left 01/10/2017   Procedure: CATARACT EXTRACTION PHACO AND INTRAOCULAR LENS PLACEMENT (IOC);  Surgeon: Birder Robson, MD;  Location: [facility] ORS;  Service: Ophthalmology;  Laterality: Left;  [place] 1:01 AP% 17.5 CDE 10.72 Fluid pack lot # [id number]  . COLON SURGERY     RESECTION  . COLONOSCOPY    . CORONARY ARTERY BYPASS GRAFT  11/24/2008  . KIDNEY SURGERY     PARTIAL RESECTION    Prior to Admission medications   Medication Sig Start Date End Date Taking? Authorizing Provider  amLODipine (NORVASC) 10 MG tablet Take 1 tablet (10 mg total) by mouth daily. 05/17/18   [name], NP  aspirin EC 81 MG tablet Take 1 tablet (81 mg total) daily by mouth. Patient not taking: Reported on 05/17/2018 10/24/17   Minna Merritts, MD  Lutein 20 MG  CAPS Take by mouth.    [provider]  magnesium 30 MG tablet Take 30 mg by mouth 2 (two) times daily.    [provider]  metoprolol succinate (TOPROL-XL) 25 MG 24 hr tablet Take 1 tablet (25 mg total) by mouth daily. 05/22/18   [name], NP  rosuvastatin (CRESTOR) 10 MG tablet Take 1 tablet (10 mg total) by mouth daily. 05/22/18   [name], NP  triamcinolone cream (KENALOG) 0.1 %  04/26/18   [provider]  Zinc 30 MG CAPS Take by mouth.     [provider]    Allergies Patient has no known allergies.  Family History  Problem Relation Age of Onset  . Prostate cancer Neg Hx   . Kidney cancer Neg Hx   . Bladder Cancer Neg Hx     Social History Social History   Tobacco Use  . Smoking status: Never Smoker  . Smokeless tobacco: Never Used  Substance Use Topics  . Alcohol use: No  . Drug use: No    Review of Systems Constitutional: No fever/chills Eyes: No visual changes. ENT: No sore throat. Cardiovascular: Denies chest pain. Respiratory: Denies shortness of breath. Gastrointestinal: No abdominal pain.  No vomiting.  No diarrhea.  No constipation. Genitourinary: Negative for dysuria.  Musculoskeletal: Negative for back pain.  No neck pain. Skin: Negative for rash. Neurological: Negative for headaches, focal weakness or numbness.  No trouble speaking.    ____________________________________________   PHYSICAL EXAM:  VITAL SIGNS: ED Triage Vitals  Enc Vitals Group     BP 08/19/18 1358 (!) 187/67     Pulse Rate 08/19/18 1358 (!) 58     Resp 08/19/18 1400 13     Temp 08/19/18 1358 98.1 F (36.7 C)     Temp Source 08/19/18 1358 Oral     SpO2 08/19/18 1358 97 %     Weight 08/19/18 1400 147 lb (66.7 kg)     Height 08/19/18 1400 5\' 6"  (1.676 m)     Head Circumference --      Peak Flow --      Pain Score 08/19/18 1400 0     Pain Loc --      Pain Edu? --      Excl. in [place]? --     Constitutional: Alert and oriented. Well appearing and in no acute distress.  Of note the patient does report he feels fine now. Eyes: Conjunctivae are normal. Head: Atraumatic. Nose: No congestion/rhinnorhea. Mouth/Throat: Mucous membranes are slightly dry. Neck: No stridor.   Cardiovascular: Normal rate, regular rhythm. Grossly normal heart sounds.  Good peripheral circulation. Respiratory: Normal respiratory effort.  No retractions. Lungs CTAB. Gastrointestinal: Soft and nontender. No  distention. Musculoskeletal: No lower extremity tenderness nor edema. Neurologic:  Normal speech and language. No gross focal neurologic deficits are appreciated.  Cranial nerve exam is normal.  No pronator drift.  Symmetric grip strength.  5 out of 5 strength in all extremities. Skin:  Skin is warm, dry and intact. No rash noted. Psychiatric: Mood and affect are normal. Speech and behavior are normal.  ____________________________________________   LABS (all labs ordered are listed, but only abnormal results are displayed)  Labs Reviewed  BASIC METABOLIC PANEL - Abnormal; Notable for the following components:      Result Value   CO2 19 (*)    Glucose, Bld 118 (*)    BUN 63 (*)    Creatinine, Ser 3.30 (*)  Calcium 8.2 (*)    GFR calc non Af Amer 15 (*)    GFR calc Af Amer 18 (*)    All other components within normal limits  CBC - Abnormal; Notable for the following components:   RBC 4.12 (*)    Hemoglobin 8.1 (*)    HCT 25.1 (*)    MCV 60.9 (*)    MCH 19.6 (*)    RDW 21.0 (*)    All other components within normal limits  TROPONIN I  URINALYSIS, COMPLETE (UACMP) WITH MICROSCOPIC  CBG MONITORING, ED   ____________________________________________  EKG  Reviewed and interpreted by me at 1352 Heart rate 60 QRS 110 QTc 475 Normal sinus rhythm, mild ST depression in V5 and V6.  No ST elevation. ____________________________________________  RADIOLOGY    ____________________________________________   PROCEDURES  Procedure(s) performed: None  Procedures  Critical Care performed: No  ____________________________________________   INITIAL IMPRESSION / ASSESSMENT AND PLAN / ED COURSE  Pertinent labs & imaging results that were available during my care of the patient were reviewed by me and considered in my medical decision making (see chart for details).  Patient was her evaluation after syncopal episode.  Now recovered, but does have a notable history of  renal disease and previous history of coronary disease with no previous history of syncope.  EKG does demonstrate some mild ST depression primarily laterally, but previous EKG also demonstrates similar does not appear to be an acute finding.  No chest pain or trouble breathing.  No neurologic symptoms other than syncope for which he now has complete neurologic recovery.  No headache no trauma.  Lab work reviewed, creatinine is elevated but appears to be in his most recent baseline, Co2 is slightly low suggestive of likely some possible element of dehydration and he reports feeling lightheaded with orthostatics done prior to fluid bolus.  Additionally does have a mild anemia, hemoglobin appears to be slowly downtrending over time denies any black or bloody stools or bleeding symptoms.  ----------------------------------------- 4:18 PM on 08/19/2018 -----------------------------------------  Patient reports asymptomatic, discussed with patient and recommended admission for further evaluation of his cause of passing out including monitoring of his kidney function, blood counts as he has a slowly lowering blood count (appears microcytic on past diffs), and further cardiac evaluation this evening.  Patient reports he does not wish to stay, and after discussing my recommendation for him to be admitted for further evaluation regarding his episode of passing out and shared medical decision making he is decided to be discharged.  He understands that we wish to make sure he is not having any sort of acute heart related episode, bleeding, or other concerning cause that could have could have made him pass out today but he reports he wished to be discharged, feels improved, and will follow up with his primary doctor.  Also discussed with his daughters at the bedside and his son via phone and reviewed his lab work and my recommendation for admission, but patient is alert appears to have capacity and would like to be  discharged.  Will discharge patient home, careful return precautions and recommendations to follow-up with his primary doctor and nephrology were discussed with the patient who is in agreement.  Return precautions and treatment recommendations and follow-up discussed with the patient who is agreeable with the plan.       ____________________________________________   FINAL CLINICAL IMPRESSION(S) / ED DIAGNOSES  Final diagnoses:  Chronic kidney disease, unspecified CKD stage  Syncope and  collapse  Anemia, unspecified type      NEW MEDICATIONS STARTED DURING THIS VISIT:  New Prescriptions   No medications on file     Note:  This document was prepared using Dragon voice recognition software and may include unintentional dictation errors.     Delman Kitten, MD 08/19/18 1620

## 2018-08-21 ENCOUNTER — Ambulatory Visit: Payer: Self-pay | Admitting: Nurse Practitioner

## 2018-08-23 ENCOUNTER — Ambulatory Visit (INDEPENDENT_AMBULATORY_CARE_PROVIDER_SITE_OTHER): Payer: Medicare Other | Admitting: Nurse Practitioner

## 2018-08-23 ENCOUNTER — Encounter: Payer: Self-pay | Admitting: Nurse Practitioner

## 2018-08-23 ENCOUNTER — Other Ambulatory Visit: Payer: Self-pay

## 2018-08-23 VITALS — BP 152/64 | HR 68 | Temp 98.6°F | Ht 66.0 in | Wt 145.2 lb

## 2018-08-23 DIAGNOSIS — D509 Iron deficiency anemia, unspecified: Secondary | ICD-10-CM | POA: Diagnosis not present

## 2018-08-23 DIAGNOSIS — Z87898 Personal history of other specified conditions: Secondary | ICD-10-CM | POA: Diagnosis not present

## 2018-08-23 DIAGNOSIS — I499 Cardiac arrhythmia, unspecified: Secondary | ICD-10-CM

## 2018-08-23 DIAGNOSIS — M545 Low back pain, unspecified: Secondary | ICD-10-CM

## 2018-08-23 NOTE — Patient Instructions (Addendum)
Jake Garcia,   Thank you for coming in to clinic today.  1. Iron-rich foods Heme sources (absorbed better than non-heme sources): .3 ounces of meat (beef or chicken liver, clams, mussels, oysters, beef, canned sardines (in oil)  Non-heme sources (You will absorb less of these than heme sources, but important part of iron-rich diet): .Chicken, [place], ham, veal, halibut, haddock, perch, salmon, or tuna .Breakfast cereals enriched with iron .One cup of cooked beans (kidney, lima, chickpeas) .One-half cup of tofu .One cup of dried apricots .One medium baked potato .One cup of cooked enriched egg noodles .One-fourth cup of wheat germ .1 ounce of pumpkin, sesame, or squash seeds .1 ounce of peanuts, pecans, walnuts, pistachios, roasted almonds, roasted cashews, or sunflower seeds .One-half cup of dried seedless raisins, peaches, or prunes .One medium stalk of broccoli .One cup of raw spinach .One cup of pasta (cooked, it becomes 3-4 cups) .One slice of bread, half of a small pumpernickel bagel, or bran muffin .One cup of brown or enriched rice   2. Collect 3 stool card samples to check for blood in your bowel movements.  Smear a very small amount (very thin and nearly clear layer) of stool once daily for 3 days.  If you don't have a daily bowel movement, this can take 4-6 days to complete.   3. You have a low back muscle strain and bruising.  - Start taking Tylenol extra strength 1 to 2 tablets every 6-8 hours for aches or fever/chills for next few days as needed.  Do not take more than 3,000 mg in 24 hours from all medicines.   - Use heat and ice.  Apply this for 15 minutes at a time 6-8 times per day.   - Muscle rub with lidocaine, lidocaine patch, Biofreeze, or tiger balm for topical pain relief.  Avoid using this with heat and ice to avoid burns.  Please schedule a follow-up appointment with Cassell Smiles, AGNP. Return in about 2 months (around 10/23/2018) for hypertension.   Please followup sooner if needed for persistent back pain or if you have any new dizziness.  If you have any other questions or concerns, please feel free to call the clinic or send a message through [place]. You may also schedule an earlier appointment if necessary.  You will receive a survey after today's visit either digitally by e-mail or paper by [employer]. Your experiences and feedback matter to [place].  Please respond so we know how we are doing as we provide care for you.   Cassell Smiles, DNP, AGNP-BC Adult Gerontology Nurse Practitioner [place]

## 2018-08-23 NOTE — Progress Notes (Signed)
Subjective:    Patient ID: CHILD CAMPOY, male    DOB: 1929/11/01, 84 y.o.   MRN: [id number]  Jake Garcia is a 84 y.o. male presenting on 08/23/2018 for Hospitalization Follow-up ( standing at a local cafeteria in line for about 30 to 40 minutes when he started feeling lightheaded, dizzy and fainted.  He injured his back iduring the episode, but did not hit his head.) and Hypertension   HPI ED Followup Visit Hospital/Location: [facility] Date of ED Visit: 08/19/2018 Reason for Presenting to ED: loss of consciousness   Primary (+Secondary) Diagnosis: syncope and collapse, CKD, anemia  FOLLOW-UP - ED provider note and record have been reviewed - Patient presents today about 4 days after recent ED visit. Brief summary of recent course, patient had symptoms of lightheadedness/dizziness with syncope after standing in line at [employer] for about 30 minutes after church on Sunday (4 days ago).  He had not consumed much water that morning.  Had not had any dizziness prior to syncopal event.  He also reports no dizziness or recurrent events since Sunday.   - Patient presents today for ED followup.  He did not stay for observation as recommended by ED physician. In ED, patient was found to have worsening anemia (microcytic, hypochromic).  He had regular HR and rhythm on EKG.   - Today reports overall has done well after discharge from ED except for left sided low back pain that is making movement difficult.   - New medications on discharge: none - Changes to current meds on discharge: none   Social History   Tobacco Use  . Smoking status: Never Smoker  . Smokeless tobacco: Never Used  Substance Use Topics  . Alcohol use: No  . Drug use: No    Review of Systems Per HPI unless specifically indicated above     Objective:    BP (!) 152/64 (BP Location: Left Arm, Patient Position: Sitting, Cuff Size: Normal)   Pulse 68   Temp 98.6 F (37 C) (Oral)   Ht 5\' 6"  (1.676 m)   Wt 145 lb 3.2 oz (65.9 kg)    BMI 23.44 kg/m    Wt Readings from Last 3 Encounters:  08/23/18 145 lb 3.2 oz (65.9 kg)  08/19/18 147 lb (66.7 kg)  05/17/18 147 lb (66.7 kg)    Physical Exam  Constitutional: He is oriented to person, place, and time. He appears well-developed and well-nourished. No distress.  HENT:  Head: Normocephalic and atraumatic.  Cardiovascular: Normal rate, S1 normal, S2 normal and intact distal pulses. An irregularly irregular rhythm present.  Murmur heard.  Systolic murmur is present with a grade of 2/6. Pulses:      Radial pulses are 2+ on the right side, and 2+ on the left side.       Posterior tibial pulses are 2+ on the right side, and 2+ on the left side.  Pulmonary/Chest: Effort normal and breath sounds normal. No respiratory distress.  Musculoskeletal:       Left lower leg: He exhibits edema (+1 non-pitting edema).  Low Back Inspection: Normal appearance, normal body habitus, no spinal deformity, symmetrical. Palpation: No tenderness over spinous processes. Left sided lumbar paraspinal muscles tender and without hypertonicity/spasm. ROM: Full active ROM forward flex / back extension, rotation L/R without discomfort Special Testing: Seated SLR negative for radicular pain and localized pain bilaterally.  Strength: Bilateral hip flex/ext 5/5, knee flex/ext 5/5, ankle dorsiflex/plantarflex 5/5 Neurovascular: intact distal sensation to light touch  Neurological: He is alert and oriented to person, place, and time.  Skin: Skin is warm and dry.  Psychiatric: He has a normal mood and affect. His behavior is normal.  Vitals reviewed.  08/23/18 EKG: 1st deg HB with escape atrial beats, prolonged QT, wide QRS  HR 60 bpm PR 236ms QT 437ms   QTc 432ms QRS 182ms  Axis deviation: left    Results for orders placed or performed during the hospital encounter of 93/23/55  Basic metabolic panel  Result Value Ref Range   Sodium 138 135 - 145 mmol/L   Potassium 5.1 3.5 - 5.1 mmol/L   Chloride 110  98 - 111 mmol/L   CO2 19 (L) 22 - 32 mmol/L   Glucose, Bld 118 (H) 70 - 99 mg/dL   BUN 63 (H) 8 - 23 mg/dL   Creatinine, Ser 3.30 (H) 0.61 - 1.24 mg/dL   Calcium 8.2 (L) 8.9 - 10.3 mg/dL   GFR calc non Af Amer 15 (L) >60 mL/min   GFR calc Af Amer 18 (L) >60 mL/min   Anion gap 9 5 - 15  CBC  Result Value Ref Range   WBC 8.2 3.8 - 10.6 K/uL   RBC 4.12 (L) 4.40 - 5.90 MIL/uL   Hemoglobin 8.1 (L) 13.0 - 18.0 g/dL   HCT 25.1 (L) 40.0 - 52.0 %   MCV 60.9 (L) 80.0 - 100.0 fL   MCH 19.6 (L) 26.0 - 34.0 pg   MCHC 32.1 32.0 - 36.0 g/dL   RDW 21.0 (H) 11.5 - 14.5 %   Platelets 204 150 - 440 K/uL  Urinalysis, Complete w Microscopic  Result Value Ref Range   Color, Urine YELLOW (A) YELLOW   APPearance CLEAR (A) CLEAR   Specific Gravity, Urine 1.011 1.005 - 1.030   pH 5.0 5.0 - 8.0   Glucose, UA NEGATIVE NEGATIVE mg/dL   Hgb urine dipstick NEGATIVE NEGATIVE   Bilirubin Urine NEGATIVE NEGATIVE   Ketones, ur NEGATIVE NEGATIVE mg/dL   Protein, ur 100 (A) NEGATIVE mg/dL   Nitrite NEGATIVE NEGATIVE   Leukocytes, UA NEGATIVE NEGATIVE   RBC / HPF 0-5 0 - 5 RBC/hpf   WBC, UA 0-5 0 - 5 WBC/hpf   Bacteria, UA NONE SEEN NONE SEEN   Squamous Epithelial / LPF NONE SEEN 0 - 5   Mucus PRESENT   Troponin I  Result Value Ref Range   Troponin I <0.03 <0.03 ng/mL      Assessment & Plan:   Problem List Items Addressed This Visit    None    Visit Diagnoses    Microcytic hypochromic anemia    -  Primary   Relevant Orders   Iron, TIBC and Ferritin Panel   Irregular heart rate       Relevant Orders   EKG 12-Lead   History of syncope       Acute left-sided low back pain without sciatica          #Patient with history of syncope 4 days ago.  No recurrent events have been experienced or reported.  In ED patient was found to have regular sinus rhythm on EKG.  Irregularly irregular rhythm on exam today.  EKG shows.  Microcytic hypochromic anemia also a concern for identifying source of bleeding vs  nutritional deficiency to correct the anemia which may have contributed to syncopal event.   Plan: 1. Iron, TIBC, ferritin today 2. EKG today sinus rhythm with escape atrial beats, which were not present on  EKG 08/19/2018 3. Consider sooner cardiology followup - due 10/2018 4. May need holter monitor for 24-48 hours if any recurrent symptoms occur. 5. Reviewed emergency signs and symptoms and when to call clinic. 6. Followup 2 months or sooner if needed.   # Back Pain Pain likely self-limited.  Lumbar contusion 2/2 syncope and collapse with impact of lower back on railing.    Plan:  1. Treat with OTC pain meds (acetaminophen).  Discussed alternate dosing and max dosing. 2. Apply heat and/or ice to affected area. 3. May also apply a muscle rub with lidocaine or lidocaine patch after heat or ice. 4. Follow up 2-4 weeks prn if no improvement.   I have reviewed the discharge medication list, and have reconciled the current and discharge medications today.  Follow up plan: Return in about 2 months (around 10/23/2018) for hypertension.  Cassell Smiles, DNP, AGPCNP-BC Adult Gerontology Primary Care Nurse Practitioner [facility] 08/23/2018, 12:10 PM

## 2018-08-24 LAB — IRON,TIBC AND FERRITIN PANEL
%SAT: 8 % (calc) — ABNORMAL LOW (ref 20–48)
Ferritin: 24 ng/mL (ref 24–380)
Iron: 25 ug/dL — ABNORMAL LOW (ref 50–180)
TIBC: 307 mcg/dL (calc) (ref 250–425)

## 2018-08-28 ENCOUNTER — Other Ambulatory Visit (INDEPENDENT_AMBULATORY_CARE_PROVIDER_SITE_OTHER): Payer: Medicare Other

## 2018-08-28 ENCOUNTER — Telehealth: Payer: Self-pay

## 2018-08-28 DIAGNOSIS — D509 Iron deficiency anemia, unspecified: Secondary | ICD-10-CM | POA: Diagnosis not present

## 2018-08-28 LAB — HEMOCCULT GUIAC POC 1CARD (OFFICE)
Card #2 Fecal Occult Blod, POC: NEGATIVE
Card #3 Fecal Occult Blood, POC: NEGATIVE
Fecal Occult Blood, POC: NEGATIVE

## 2018-08-28 NOTE — Telephone Encounter (Signed)
Reviewed in labs.  No changes to report.

## 2018-08-28 NOTE — Telephone Encounter (Signed)
The pt HemoOccult cards x 3  (-) negative  Documented in patient chart.

## 2018-09-24 ENCOUNTER — Telehealth: Payer: Self-pay | Admitting: Nurse Practitioner

## 2018-09-24 NOTE — Telephone Encounter (Signed)
I spoke with Mr. Leighty and he verbalize that it was okay to fax the lab results. The labs were faxed over.

## 2018-09-24 NOTE — Telephone Encounter (Signed)
Jake Garcia with [facility] wants most recent lab results faxed to [phone].  Her call back number is [phone] ext [phone]

## 2018-10-24 ENCOUNTER — Encounter: Payer: Self-pay | Admitting: Nurse Practitioner

## 2018-10-24 ENCOUNTER — Ambulatory Visit (INDEPENDENT_AMBULATORY_CARE_PROVIDER_SITE_OTHER): Payer: Medicare Other | Admitting: Nurse Practitioner

## 2018-10-24 ENCOUNTER — Ambulatory Visit: Payer: Medicare Other | Admitting: Internal Medicine

## 2018-10-24 VITALS — BP 160/62 | HR 66 | Ht 67.0 in | Wt 150.0 lb

## 2018-10-24 DIAGNOSIS — I2510 Atherosclerotic heart disease of native coronary artery without angina pectoris: Secondary | ICD-10-CM

## 2018-10-24 DIAGNOSIS — R55 Syncope and collapse: Secondary | ICD-10-CM | POA: Diagnosis not present

## 2018-10-24 DIAGNOSIS — R0989 Other specified symptoms and signs involving the circulatory and respiratory systems: Secondary | ICD-10-CM | POA: Diagnosis not present

## 2018-10-24 DIAGNOSIS — E785 Hyperlipidemia, unspecified: Secondary | ICD-10-CM

## 2018-10-24 DIAGNOSIS — I10 Essential (primary) hypertension: Secondary | ICD-10-CM

## 2018-10-24 DIAGNOSIS — N184 Chronic kidney disease, stage 4 (severe): Secondary | ICD-10-CM | POA: Diagnosis not present

## 2018-10-24 NOTE — Progress Notes (Signed)
Office Visit    Patient Name: Jake Garcia Date of Encounter: 10/24/2018  Primary Care Provider:  [name], NP Primary Cardiologist:  Jake Rogue, MD  Chief Complaint    Jake Garcia is a 84 y.o. male with a h/o CAD s/p CABG x 1 (LIMA  LAD) w/ subsequent PCI of the RCA, HLD, HTN, mild Carotid stenosis, CKD IV, and history of prostate cancer, who presents for eval related to recent syncopal spell.  Past Medical History    Past Medical History:  Diagnosis Date  . Coronary artery disease    a. 11/2008 Cath: severe prox LAD dzs, otw nonobs dzs. LAD not felt to be amenable to PCI; b. 11/2008 CABG x 1: LIMA->LAD.  [name] Diastolic dysfunction    a. 09/2015 Echo: EF 65-70%, no rwma, Gr1 DD. Mod dil LA. PASP 51mmHg.  [name] Hyperlipidemia   . Hypertension   . Prostate cancer ([place])   . Right carotid bruit    a. 09/2015 Carotid U/S: <39% bilat ICA stenosis.  . Stage I Colon cancer ([place])    a. 2008 s/p L hemicolectomy ([facility]). 18 neg nodes.   . Syncope 08/2018   Past Surgical History:  Procedure Laterality Date  . CARDIAC CATHETERIZATION  11/20/2008  . CATARACT EXTRACTION W/PHACO Right 05/10/2016   Procedure: CATARACT EXTRACTION PHACO AND INTRAOCULAR LENS PLACEMENT (IOC);  Surgeon: Jake Robson, MD;  Location: [facility] ORS;  Service: Ophthalmology;  Laterality: Right;  [place] 00:56 AP% 23.7 CDE 13.29 fluid pack lot # [id number]  . CATARACT EXTRACTION W/PHACO Left 01/10/2017   Procedure: CATARACT EXTRACTION PHACO AND INTRAOCULAR LENS PLACEMENT (IOC);  Surgeon: Jake Robson, MD;  Location: [facility] ORS;  Service: Ophthalmology;  Laterality: Left;  [place] 1:01 AP% 17.5 CDE 10.72 Fluid pack lot # [id number]  . COLON SURGERY  2008  . COLONOSCOPY    . CORONARY ARTERY BYPASS GRAFT  11/24/2008  . Right Partial Nephrectomy  2008   a. Performed due to benign lesion.    Allergies  No Known Allergies  History of Present Illness    Jake Garcia is a 84 y.o. male with a h/o CAD (CABG 2/2 high  grade complex stenosis of the LAD with a LIMA to the LAD, in 11/2008; PCI with in 07/2011 with 99% mid RCA lesion with DES placed, 25% stenosis of left main, LIMA to the LAD patent with minimal LAD disease, 30% ramus proximal disease, 40% OM 2 mid-vessel disease, no LV gram performed), HLD, HTN, mild bilat carotid stenosis, CKD IV, and history of prostate and colon cancers.   Most recent echo from 10/11/2015 showed LVH with EF 65-70%, normal wall motion, G1DD, calcified mitral annulus, dilated left atrium and mildly increase PASP of 37 mmHg. He was most recently seen by Jake Garcia on 10/24/2017 where he reported he was "doing well". EKG at that time showed NSR 66, with non-specific ST abnormality. Most recent lipid panel in 05/21/2018 showed elevated LDL of 119.   Jake Garcia had a recent visit to the emergency dept on 08/19/2018. He had not had anything to eat or drink that morning and was apparently standing in line for 30-40 mins, when according to the ER note, he became nauseated, dizzy, and slid to the floor, where he lost consciousness for approx 1 min.  Pt says today however, that he only experienced lightheadedness w/o n/v/diaphoresis, cp, or dyspnea.  He did lean up against the wall and began slumping down it, when his lady friend assisted him  to the floor.  Once down, he believes he lost consciousness.  Jake Garcia was called and he was taken to the [facility] ED where his blood pressure in the ED was elevated at 187/67, EKG showed NSR with HR 60 and mild ST depression in V5 and V6 without ST elevations similar to a previous EKG, Troponin was <0.03, and CBC showing mild anemia. It was recommended that he stay for observation in the E.D. but he declined stating that he felt "completely normal".  On follow-up with his PCP on 9/5 he reported that he was doing well without further fainting or symptoms.  On physical exam an irregular heart rhythm and heart murmur were noted, as well as new lower extremity edema. EKG in office  on 9/5 showed sinus rhythm w/ a 1st deg avb and a blocked PAC.  He was advised to f/u with cardiology.  Since then, he has had no recurrent presyncope or syncope.  He has remained active and dances weekly with a different lady friend.  He denies chest pain, palpitations, pnd, orthopnea, n, v, dizziness, syncope, edema, weight gain, or early satiety.  He does report noncompliance with meds, missing doses of his medications regularly and thinks the last time he took any was on Sunday (11/3) and that sometimes when he doesn't take his medication he gets "dizzy". He reports high blood pressures when he takes them infrequently at home.  Orthostatic vitals today showed no orthostatic hypotension. His creatinine levels were up in the emergency room and he states that he hasn't seen a nephrologist in at least ten years but that he still makes urine and sees a urologist.    Home Medications    Prior to Admission medications   Medication Sig Start Date End Date Taking? Authorizing Provider  amLODipine (NORVASC) 10 MG tablet Take 1 tablet (10 mg total) by mouth daily. 05/17/18   Jake College, NP  aspirin EC 81 MG tablet Take 1 tablet (81 mg total) daily by mouth. Patient not taking: Reported on 08/23/2018 10/24/17   Minna Merritts, MD  Lutein 20 MG CAPS Take by mouth.    [provider]  magnesium 30 MG tablet Take 30 mg by mouth 2 (two) times daily.    [provider]  metoprolol succinate (TOPROL-XL) 25 MG 24 hr tablet Take 1 tablet (25 mg total) by mouth daily. 05/22/18   Jake College, NP  rosuvastatin (CRESTOR) 10 MG tablet Take 1 tablet (10 mg total) by mouth daily. 05/22/18   Jake College, NP  triamcinolone cream (KENALOG) 0.1 %  04/26/18   [provider]  Zinc 30 MG CAPS Take by mouth.    [provider]    Review of Systems    Some dyspnea when dancing.  Denies HA, numbness, tingling, diaphoresis, chest pain, tightness, nausea, vomiting,  orthopnea, PND, edema, or early satiety.  All other systems reviewed and are otherwise negative except as noted above.  Physical Exam    VS:  BP (!) 160/62 (BP Location: Left Arm, Patient Position: Sitting, Cuff Size: Normal)   Pulse 66   Ht 5\' 7"  (1.702 m)   Wt 150 lb (68 kg)   BMI 23.49 kg/m  , BMI Body mass index is 23.49 kg/m.   Orthostatic VS for the past 24 hrs:  BP- Lying Pulse- Lying BP- Sitting Pulse- Sitting BP- Standing at 0 minutes Pulse- Standing at 0 minutes  10/24/18 1357 157/76 62 156/78 64 159/73 69   GEN:  Elderly, well nourished, well developed, in no acute distress. HEENT: normal. Neck: Carotid bruit on the right side. Neck supple, no JVD, or masses. Cardiac: 2/6 soft, blowing systolic murmur heard at the LLSB  apex. RRR, rubs, or gallops. No clubbing, cyanosis, edema.  Radials/DP/PT 2+ and equal bilaterally.  Respiratory:  Respirations regular and unlabored, clear to auscultation bilaterally. GI: Soft, nontender, nondistended, BS + x 4. MS: Trace non-pitting edema to the left ankle which is chronic according to the pt. Normal ROM, pt moving independently, no deformity or atrophy. Skin: warm and dry, no rash. Neuro:  Strength and sensation are intact. Psych: Normal affect.  Accessory Clinical Findings    ECG personally reviewed by me today - RSR, 66, 1st degree AV block, LAD, non-specific ST/T changes - no acute changes.  Assessment & Plan    1.  Syncope:  On 9/1, pt suffered a syncopal spell while standing in line @ [employer] caf[employer]ia.  He says that he was [facility] w/o other prodromal Ss, but ER note indicates that he also had nausea.  W/u in ED unremarkable, other than CKD IV w/ creat of 3.3.  EKG w/ 1st deg avb.  F/u EKG on 9/5 again w/ 1st deg avb (chrnoic) and a blocked PAC.  He has not had any recurrent presyncope/syncope. EKG today is unchanged compared to pior EKGs. He has not been having any chest pain, tightness, nausea, vomiting or other anginal symptoms.  I  discussed fourteen day Zio monitor to assess for arrhythmias, he said that he would like to think about it and will get back to [place]. He is amenable for a repeat Echo, will schedule that today.   2. CAD: Pt is currently non-compliant with his metoprolol, amlodipine, ASA and statin medications. Currently symptom free. I strongly encouraged him to take his blood pressure medications and discussed the risk of stroke and further vessel damage.  He has not been having any chest pain.  Some DOE when dancing - esp faster dances.  Follow up echo as above.   3. Essential HTN:  As above, hasn't taken meds.  Advised to resume.  4.  Carotid Artery Disease/R carotid bruit: Right sided bruit heard on exam. Pt has known history of less than 39% stenosis bilat. Ordered follow-up carotid ultrasound in setting of prior syncopal spell.  4. HLD: Pt is currently non-compliant with his medical therapy. Strongly encouraged him to take his Crestor daily as prescribed. Discussed importance of risk factor optimization to reduce risk of future heart attack and stroke.   5. CKD IV: Serum creatinine elevated at 3.30 from 2.08 one year ago. Referral to nephrology ordered.  He says that he saw nephrology about 10 yrs ago but hasn't f/u.  6. Anemia: Pt is taking oral iron supplementation. Stable, managed per PCP.   7. Disposition: Follow up echo and carotid u/s. Pt will think about monitoring.  F/u in 1 month to discuss results of testing.   Murray Hodgkins, NP 10/24/2018, 4:05 PM

## 2018-10-24 NOTE — Patient Instructions (Addendum)
Medication Instructions:  - Your physician recommends that you continue on your current medications as directed. Please refer to the Current Medication list given to you today.  If you need a refill on your cardiac medications before your next appointment, please call your pharmacy.   Lab work: - none ordered  If you have labs (blood work) drawn today and your tests are completely normal, you will receive your results only by: [name] MyChart Message (if you have MyChart) OR . A paper copy in the mail If you have any lab test that is abnormal or we need to change your treatment, we will call you to review the results.  Testing/Procedures: - Your physician has requested that you have an echocardiogram. Echocardiography is a painless test that uses sound waves to create images of your heart. It provides your doctor with information about the size and shape of your heart and how well your heart's chambers and valves are working. This procedure takes approximately one hour. There are no restrictions for this procedure. Please come well hydrated to the test as we occasionally have to start an IV on some patients. This is done to inject an image enhancer for better quality pictures if needed.   - Your physician has requested that you have a carotid duplex. This test is an ultrasound of the carotid arteries in your neck. It looks at blood flow through these arteries that supply the brain with blood. Allow one hour for this exam. There are no restrictions or special instructions.  Follow-Up: At [facility], you and your health needs are our priority.  As part of our continuing mission to provide you with exceptional heart care, we have created designated Provider Care Teams.  These Care Teams include your primary Cardiologist (physician) and Advanced Practice Providers (APPs -  Physician Assistants and Nurse Practitioners) who all work together to provide you with the care you need, when you need it. . 1  month with Dr. Rockey Situ   - You have been referred to - Nephrology - we will call you with an appointment once we get this scheduled for you.   Any Other Special Instructions Will Be Listed Below (If Applicable). - N/A  Echocardiogram An echocardiogram, or echocardiography, uses sound waves (ultrasound) to produce an image of your heart. The echocardiogram is simple, painless, obtained within a short period of time, and offers valuable information to your health care provider. The images from an echocardiogram can provide information such as:  Evidence of coronary artery disease (CAD).  Heart size.  Heart muscle function.  Heart valve function.  Aneurysm detection.  Evidence of a past heart attack.  Fluid buildup around the heart.  Heart muscle thickening.  Assess heart valve function.  Tell a health care provider about:  Any allergies you have.  All medicines you are taking, including vitamins, herbs, eye drops, creams, and over-the-counter medicines.  Any problems you or family members have had with anesthetic medicines.  Any blood disorders you have.  Any surgeries you have had.  Any medical conditions you have.  Whether you are pregnant or may be pregnant. What happens before the procedure? No special preparation is needed. Eat and drink normally. What happens during the procedure?  In order to produce an image of your heart, gel will be applied to your chest and a wand-like tool (transducer) will be moved over your chest. The gel will help transmit the sound waves from the transducer. The sound waves will harmlessly bounce off  your heart to allow the heart images to be captured in real-time motion. These images will then be recorded.  You may need an IV to receive a medicine that improves the quality of the pictures. What happens after the procedure? You may return to your normal schedule including diet, activities, and medicines, unless your health care  provider tells you otherwise. This information is not intended to replace advice given to you by your health care provider. Make sure you discuss any questions you have with your health care provider. Document Released: 12/02/2000 Document Revised: 07/23/2016 Document Reviewed: 08/12/2013 Elsevier Interactive Patient Education  2017 [employer].

## 2018-10-25 ENCOUNTER — Telehealth: Payer: Self-pay | Admitting: Nurse Practitioner

## 2018-10-25 ENCOUNTER — Ambulatory Visit (INDEPENDENT_AMBULATORY_CARE_PROVIDER_SITE_OTHER): Payer: Medicare Other | Admitting: Nurse Practitioner

## 2018-10-25 VITALS — BP 140/60 | HR 66 | Ht 67.0 in | Wt 148.6 lb

## 2018-10-25 DIAGNOSIS — D509 Iron deficiency anemia, unspecified: Secondary | ICD-10-CM | POA: Diagnosis not present

## 2018-10-25 DIAGNOSIS — I10 Essential (primary) hypertension: Secondary | ICD-10-CM

## 2018-10-25 DIAGNOSIS — E7800 Pure hypercholesterolemia, unspecified: Secondary | ICD-10-CM

## 2018-10-25 DIAGNOSIS — Z23 Encounter for immunization: Secondary | ICD-10-CM

## 2018-10-25 NOTE — Progress Notes (Signed)
Subjective:    Patient ID: Jake Garcia, male    DOB: 11-Oct-1929, 84 y.o.   MRN: [id number]  Jake Garcia is a 84 y.o. male presenting on 10/25/2018 for Hypertension (2 month follow up)   HPI Hypertension - He is not checking BP at home or outside of clinic.    - Current medications: amlodipine 10 mg once daily, Toprol XL once daily, tolerating well without side effects - He is not currently symptomatic. - Pt denies headache, lightheadedness, dizziness, changes in vision, chest tightness/pressure, palpitations, leg swelling, sudden loss of speech or loss of consciousness.  He has had no additional syncopal events since his syncopal event 08/19/2018. - He  reports no regular exercise routine, but is regularly active at home and occasionally goes to the gym. - His diet is low in salt, moderate in fat, and moderate in carbohydrates.   Hyperlipidemia He states he is taking rosuvastatin most days, but misses taking all medications when he is with his girlfriend.  He states this is true for his blood pressure medications as well.   - Pt denies changes in vision, chest tightness/pressure, palpitations, shortness of breath, leg pain while walking, leg or arm weakness, and sudden loss of speech or loss of consciousness.  - He continues working toward healthy lifestyle.  Anemia Patient is taking iron tablets "occasionally," but has not taken these regularly since his last CBC and initial diagnosis of anemia.  Again, he reports no repeat syncopal events.  Social History   Tobacco Use  . Smoking status: Never Smoker  . Smokeless tobacco: Never Used  Substance Use Topics  . Alcohol use: No  . Drug use: No    Review of Systems Per HPI unless specifically indicated above     Objective:    BP 140/60 (BP Location: Left Arm, Patient Position: Sitting, Cuff Size: Normal)   Pulse 66   Ht 5\' 7"  (1.702 m)   Wt 148 lb 9.6 oz (67.4 kg)   SpO2 99%   BMI 23.27 kg/m   Wt Readings from Last 3  Encounters:  10/25/18 148 lb 9.6 oz (67.4 kg)  10/24/18 150 lb (68 kg)  08/23/18 145 lb 3.2 oz (65.9 kg)    Physical Exam  Constitutional: He is oriented to person, place, and time. He appears well-developed and well-nourished. No distress.  HENT:  Head: Normocephalic and atraumatic.  Neck: Normal range of motion. Neck supple. Carotid bruit is present (Right).  Cardiovascular: Normal rate, regular rhythm, S1 normal, S2 normal, normal heart sounds and intact distal pulses.  Pulmonary/Chest: Effort normal and breath sounds normal. No respiratory distress.  Musculoskeletal: He exhibits no edema (pedal).  Neurological: He is alert and oriented to person, place, and time.  Skin: Skin is warm and dry. Capillary refill takes less than 2 seconds.  Psychiatric: His speech is normal. Judgment and thought content normal. He is withdrawn (pt states this is related to some life stressors). He exhibits a depressed mood.  Vitals reviewed.  Results for orders placed or performed in visit on 08/28/18  POCT Occult Blood Stool  Result Value Ref Range   Fecal Occult Blood, POC Negative Negative   Card #1 Date 08/25/18    Card #2 Fecal Occult Blod, POC Negative    Card #2 Date 08/26/18    Card #3 Fecal Occult Blood, POC Negative    Card #3 Date 08/27/18       Assessment & Plan:   Problem List Items Addressed This  Visit      Cardiovascular and Mediastinum   HTN (hypertension) - Primary    Currently controlled today at borderline goal < 140/90 on amlodipine and lisinopril.   - Complicated by CKD: (01/23/4981: GFR 27 w/ Cr. 2.08      08/19/2018: GFR 15 w/ Cr 3.30)    Plan: 1. Continue amlodipine 10 mg once daily. 2. Continue Toprol XL once daily  3. Continue regular Follow-up with Nephrology - upcoming appointment 4. Follow up 3 months.       Relevant Orders   COMPLETE METABOLIC PANEL WITH GFR   Lipid panel     Other   Hyperlipidemia    Patient continues infrequently taking statin medication, but  has started the med.  Goal LDL < 70.  Plan: 1. Continue rosuvastatin 10 mg once daily.  Discussed needing to take medicines with him when he travels. 2. Labs - repeat today on rosuvastatin. 3. Encouraged regular activity, healthy diet. 4. Follow-up 3 months.        Relevant Orders   COMPLETE METABOLIC PANEL WITH GFR   Lipid panel    Other Visit Diagnoses    Microcytic hypochromic anemia     Patient previously with anemia, low serum iron.  Normal ferritin.  Taking iron tabs "occasionally."  No new syncopal events.  Plan: 1. Repeat CBC 2. Encouraged ongoing iron supplement - Ferrous sulfate 325 mg one tab every Monday, Wednesday, Friday. 3. Follow-up 3-6 months.   Relevant Orders   CBC with Differential/Platelet   Flu vaccine need     Pt > age 36.  Needs annual influenza vaccine.  Plan: 1. Administer high dose fluzone today.   Relevant Orders   Flu vaccine HIGH DOSE PF (Completed)       Follow up plan: Return in about 3 months (around 01/25/2019) for hypertension, anemia.  Cassell Smiles, DNP, AGPCNP-BC Adult Gerontology Primary Care Nurse Practitioner [facility] 10/25/2018, 8:15 AM

## 2018-10-25 NOTE — Telephone Encounter (Signed)
Referral/ BMP results over the last year/ & Ignacia Bayley, NP's note from 10/24/18 faxed to [facility], per their request to schedule an appointment in their clinic.  Notes faxed to [phone]- confirmation received.

## 2018-10-25 NOTE — Patient Instructions (Addendum)
Jake Garcia,   Thank you for coming in to clinic today.  1. Start taking medications with you when you visit your girlfriend.  You may need to use a pill box.  2. Get labs today for cholesterol (non-fasting ok), kidney function, and anemia.  Please schedule a follow-up appointment with Cassell Smiles, AGNP. Return in about 3 months (around 01/25/2019) for hypertension, anemia.  If you have any other questions or concerns, please feel free to call the clinic or send a message through [place]. You may also schedule an earlier appointment if necessary.  You will receive a survey after today's visit either digitally by e-mail or paper by [employer]. Your experiences and feedback matter to [place].  Please respond so we know how we are doing as we provide care for you.   Cassell Smiles, DNP, AGNP-BC Adult Gerontology Nurse Practitioner [place]

## 2018-10-31 ENCOUNTER — Encounter: Payer: Self-pay | Admitting: Nurse Practitioner

## 2018-10-31 DIAGNOSIS — D509 Iron deficiency anemia, unspecified: Secondary | ICD-10-CM | POA: Insufficient documentation

## 2018-10-31 NOTE — Assessment & Plan Note (Addendum)
Currently controlled today at borderline goal < 140/90 on amlodipine and lisinopril.   - Complicated by CKD: (04/25/3073: GFR 27 w/ Cr. 2.08      08/19/2018: GFR 15 w/ Cr 3.30)    Plan: 1. Continue amlodipine 10 mg once daily. 2. Continue Toprol XL once daily  3. Continue regular Follow-up with Nephrology - upcoming appointment 4. Follow up 3 months.

## 2018-10-31 NOTE — Assessment & Plan Note (Signed)
Patient continues infrequently taking statin medication, but has started the med.  Goal LDL < 70.  Plan: 1. Continue rosuvastatin 10 mg once daily.  Discussed needing to take medicines with him when he travels. 2. Labs - repeat today on rosuvastatin. 3. Encouraged regular activity, healthy diet. 4. Follow-up 3 months.

## 2018-11-08 ENCOUNTER — Other Ambulatory Visit: Payer: Medicare Other

## 2018-11-30 ENCOUNTER — Ambulatory Visit: Payer: Medicare Other

## 2018-12-02 NOTE — Progress Notes (Signed)
Cardiology Office Note  Date:  12/03/2018   ID:  Jake, Garcia 11-23-1929, MRN [id number]  PCP:  [name], NP   Chief Complaint  Patient presents with  . other    1 month f/u no complaints today. Meds reviewed verbally with pt.    HPI:  Jake Garcia is an 84 -year-old  male with a history of  coronary artery disease, peripheral vascular disease,    bypass surgery in December 2009 at [facility],  high-grade complex stenosis of the LAD with a LIMA to the LAD which was off-pump, hyperlipidemia,  hypertension,   episode of chest pain August 2012 with cardiac catheter and stent placement medication noncompliance  Right carotid with less than 39% stenosis, October 2016 Suggested of high-grade right vertebral artery stenosis He presents today for follow-up of his coronary artery disease  Lost his wife  Died from complications of treatment for non-Hodgkin's lymphoma Met another woman who is " bipolar" Asked her to marry him but she returned ring  No sx of chest pain or shortness of breath on today's visit No regular exercise program Wonders if he needs to take " all out medication" Takes 3 prescriptions amlodipine metoprolol and Crestor  Problem with eyes Bothers him the most Reports that he drinks " lots of water"  Recent lab work reviewed CR 3.3, BUN 63 HCT 25  Previously met with nephrology in [place].  Does not want to go back there   emergency dept on 08/19/2018.  Hospital records reviewed  lost consciousness for approx 1 min.  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 carotid disease unchanged from prior studies,  echocardiogram with normal ejection fraction  seen by neurosurgery and recommended to participate in physical therapy Was discharged on a statin but did not fill this prescription   At the time of his cardiac catheterization, He had a 99% mid RCA lesion with stent placed also noted to have 25% left main, LIMA to the LAD was patent with minimal LAD disease, large ramus with 30% proximal disease, small to moderate OM 2 with 40% mid vessel disease, no LV gram performed. Stent was a Xience 3.5 x 23 mm DES stent.   echocardiogram in December 2009 showed mild LVH, diastolic dysfunction, mild TR, mildly elevated right ventricular systolic pressures.   PMH:   has a past medical history of CKD (chronic kidney disease), stage IV ([place]), Coronary artery disease, Diastolic dysfunction, Hyperlipidemia, Hypertension, Prostate cancer ([place]), Right carotid bruit, Stage I Colon cancer ([place]), and Syncope (08/2018).  PSH:    Past Surgical History:  Procedure Laterality Date  . CARDIAC CATHETERIZATION  11/20/2008  . CATARACT EXTRACTION W/PHACO Right 05/10/2016   Procedure: CATARACT EXTRACTION PHACO AND INTRAOCULAR LENS PLACEMENT (IOC);  Surgeon: Birder Robson, MD;  Location: [facility] ORS;  Service: Ophthalmology;  Laterality: Right;  [place] 00:56 AP% 23.7 CDE 13.29 fluid pack lot # [id number]  . CATARACT EXTRACTION W/PHACO Left 01/10/2017   Procedure:  CATARACT EXTRACTION PHACO AND INTRAOCULAR LENS PLACEMENT (IOC);  Surgeon: Birder Robson, MD;  Location: [facility] ORS;  Service: Ophthalmology;  Laterality: Left;  [place] 1:01 AP% 17.5 CDE 10.72 Fluid pack lot # [id number]  . COLON SURGERY  2008  . COLONOSCOPY    . CORONARY ARTERY BYPASS GRAFT  11/24/2008  . Right Partial Nephrectomy  2008   a. Performed due to benign lesion.     Current Outpatient Medications  Medication Sig Dispense Refill  . amLODipine (NORVASC) 10 MG tablet Take 1 tablet (10 mg total) by mouth daily. 90 tablet 3  . Lutein 20 MG CAPS Take by mouth.    . metoprolol succinate (TOPROL-XL) 25 MG 24 hr tablet Take 1 tablet (25 mg total) by mouth daily. 90 tablet 3  . rosuvastatin (CRESTOR) 20 MG tablet Take 1 tablet (20 mg total) by mouth daily. 90 tablet 3  . Zinc 30 MG CAPS Take by mouth as needed.      No current facility-administered medications for this visit.      Allergies:   Patient has no known allergies.   Social History:  The patient  reports that he has never smoked. He has never used smokeless tobacco. He reports that he does not drink alcohol or use drugs.   Family History:   family history is not on file.    Review of Systems: Review of Systems  Constitutional: Negative.   Eyes: Positive for blurred vision.  Respiratory: Negative.   Cardiovascular: Negative.   Gastrointestinal: Negative.   Musculoskeletal: Negative.        Arm weakness  Neurological: Negative.   Psychiatric/Behavioral: Negative.   All other systems reviewed and are negative.    PHYSICAL EXAM: VS:  BP (!) 150/60 (BP Location: Left Arm, Patient Position: Sitting, Cuff Size: Normal)   Pulse (!) 58   Ht '5\' 7"'  (1.702 m)   Wt 150 lb 8 oz (68.3 kg)   BMI 23.57 kg/m  , BMI Body mass index is 23.57 kg/m.  Constitutional:  oriented to person, place, and time. No distress.  HENT:  Head: Grossly normal Eyes:  no discharge. No scleral icterus.  Neck: No JVD, no carotid bruits  Cardiovascular: Regular rate and rhythm, no murmurs appreciated Pulmonary/Chest: Clear to auscultation bilaterally, no wheezes or rails Abdominal: Soft.  no distension.  no tenderness.  Musculoskeletal: Normal range of motion Neurological:  normal muscle tone. Coordination normal. No atrophy Skin: Skin warm and dry Psychiatric: normal affect, pleasant   Recent  Labs: 05/21/2018: ALT 8; TSH 2.08 08/19/2018: BUN 63; Creatinine, Ser 3.30; Hemoglobin 8.1; Platelets 204; Potassium 5.1; Sodium 138    Lipid Panel Lab Results  Component Value Date   CHOL 186 05/21/2018   HDL 45 05/21/2018   LDLCALC 119 (H) 05/21/2018   TRIG 110 05/21/2018      Wt Readings from Last 3 Encounters:  12/03/18 150 lb 8 oz (68.3 kg)  10/25/18 148 lb 9.6 oz (67.4 kg)  10/24/18 150 lb (68 kg)       ASSESSMENT AND PLAN:  Arteriosclerosis of coronary artery -  No anginal symptoms Recommend he restart low-dose aspirin 81 mg daily  increase Crestor up to 20 mg daily given numbers are above goal  Coronary artery disease involving native coronary artery of native heart with angina pectoris ([place]) - Plan: EKG 12-Lead Recommended regular walking program Aspirin statin beta-blocker  Cerebral thrombosis with cerebral infarction - Plan: EKG 12-Lead For some reason is not taking aspirin Aspirin restarted.  Does not like taking medications in general  Essential hypertension - Plan: EKG 12-Lead Mildly elevated systolic but home low diastolic Previous systolic pressures 599 We will continue current medications.  He does not want additional medications  Rash Excoriation left forearm, ecchymosis  Pure hypercholesterolemia Crestor up to 20 mg daily goal LDL less than 70  Stenosis of right carotid artery  evaluated October 2016 Right carotid with less than 39% stenosis, Suggested of high-grade right vertebral artery stenosis Stressed importance of aggressive lipid control   Total encounter time more than 25 minutes  Greater than 50% was spent in counseling and coordination of care with the patient   Disposition:   F/U  12 months   Orders Placed This Encounter  Procedures  . EKG 12-Lead     Signed, Esmond Plants, M.D., Ph.D. 12/03/2018  [place], [place]

## 2018-12-03 ENCOUNTER — Ambulatory Visit (INDEPENDENT_AMBULATORY_CARE_PROVIDER_SITE_OTHER): Payer: Medicare Other | Admitting: Cardiovascular Disease

## 2018-12-03 ENCOUNTER — Encounter: Payer: Self-pay | Admitting: Cardiovascular Disease

## 2018-12-03 VITALS — BP 150/60 | HR 58 | Ht 67.0 in | Wt 150.5 lb

## 2018-12-03 DIAGNOSIS — R55 Syncope and collapse: Secondary | ICD-10-CM

## 2018-12-03 DIAGNOSIS — I10 Essential (primary) hypertension: Secondary | ICD-10-CM

## 2018-12-03 DIAGNOSIS — N184 Chronic kidney disease, stage 4 (severe): Secondary | ICD-10-CM

## 2018-12-03 DIAGNOSIS — E7800 Pure hypercholesterolemia, unspecified: Secondary | ICD-10-CM

## 2018-12-03 DIAGNOSIS — R0989 Other specified symptoms and signs involving the circulatory and respiratory systems: Secondary | ICD-10-CM | POA: Diagnosis not present

## 2018-12-03 DIAGNOSIS — I6521 Occlusion and stenosis of right carotid artery: Secondary | ICD-10-CM

## 2018-12-03 DIAGNOSIS — E785 Hyperlipidemia, unspecified: Secondary | ICD-10-CM

## 2018-12-03 DIAGNOSIS — I25118 Atherosclerotic heart disease of native coronary artery with other forms of angina pectoris: Secondary | ICD-10-CM

## 2018-12-03 MED ORDER — FERROUS SULFATE 325 (65 FE) MG PO TABS: 325.0000 mg | ORAL_TABLET | Freq: Every day | ORAL | 3 refills | Status: AC

## 2018-12-03 MED ORDER — ROSUVASTATIN CALCIUM 20 MG PO TABS: 20.0000 mg | ORAL_TABLET | Freq: Every day | ORAL | 3 refills | Status: DC

## 2018-12-03 MED ORDER — AMLODIPINE BESYLATE 10 MG PO TABS: 10.0000 mg | ORAL_TABLET | Freq: Every day | ORAL | 3 refills | Status: DC

## 2018-12-03 MED ORDER — METOPROLOL SUCCINATE ER 25 MG PO TB24: 25.0000 mg | ORAL_TABLET | Freq: Every day | ORAL | 3 refills | Status: DC

## 2018-12-03 NOTE — Addendum Note (Signed)
Addended by: Valora Corporal on: 12/03/2018 08:40 AM   Modules accepted: Orders

## 2018-12-03 NOTE — Patient Instructions (Addendum)
Don't forget iron pill daily Ferrous sulfate  Medication Instructions:  Restart aspirin 81 mg daily Crestor up to 20 mg daily  If you need a refill on your cardiac medications before your next appointment, please call your pharmacy.    Lab work: No new labs needed   If you have labs (blood work) drawn today and your tests are completely normal, you will receive your results only by: [name] MyChart Message (if you have MyChart) OR . A paper copy in the mail If you have any lab test that is abnormal or we need to change your treatment, we will call you to review the results.   Testing/Procedures: No new testing needed   Follow-Up: At [facility], you and your health needs are our priority.  As part of our continuing mission to provide you with exceptional heart care, we have created designated Provider Care Teams.  These Care Teams include your primary Cardiologist (physician) and Advanced Practice Providers (APPs -  Physician Assistants and Nurse Practitioners) who all work together to provide you with the care you need, when you need it.  . You will need a follow up appointment in 12 months .   Please call our office 2 months in advance to schedule this appointment.    . Providers on your designated Care Team:   . Murray Hodgkins, NP . Christell Faith, PA-C . Marrianne Mood, PA-C  Any Other Special Instructions Will Be Listed Below (If Applicable).  For educational health videos Log in to : [email or web address] Or : [email or web address], password : [email or web address]

## 2019-01-25 ENCOUNTER — Ambulatory Visit: Payer: Self-pay | Admitting: Nurse Practitioner

## 2019-01-25 ENCOUNTER — Other Ambulatory Visit: Payer: Medicare Other

## 2019-02-04 ENCOUNTER — Other Ambulatory Visit (INDEPENDENT_AMBULATORY_CARE_PROVIDER_SITE_OTHER): Payer: Self-pay | Admitting: Vascular Surgery

## 2019-02-04 ENCOUNTER — Ambulatory Visit (INDEPENDENT_AMBULATORY_CARE_PROVIDER_SITE_OTHER): Payer: Medicare Other | Admitting: Vascular Surgery

## 2019-02-04 ENCOUNTER — Ambulatory Visit (INDEPENDENT_AMBULATORY_CARE_PROVIDER_SITE_OTHER): Payer: Medicare Other

## 2019-02-04 ENCOUNTER — Encounter (INDEPENDENT_AMBULATORY_CARE_PROVIDER_SITE_OTHER): Payer: Self-pay | Admitting: Vascular Surgery

## 2019-02-04 ENCOUNTER — Other Ambulatory Visit: Payer: Self-pay

## 2019-02-04 VITALS — BP 174/73 | HR 67 | Resp 12 | Ht 67.0 in | Wt 149.0 lb

## 2019-02-04 DIAGNOSIS — N186 End stage renal disease: Secondary | ICD-10-CM

## 2019-02-04 DIAGNOSIS — I129 Hypertensive chronic kidney disease with stage 1 through stage 4 chronic kidney disease, or unspecified chronic kidney disease: Secondary | ICD-10-CM | POA: Diagnosis not present

## 2019-02-04 DIAGNOSIS — E7800 Pure hypercholesterolemia, unspecified: Secondary | ICD-10-CM | POA: Diagnosis not present

## 2019-02-04 DIAGNOSIS — N184 Chronic kidney disease, stage 4 (severe): Secondary | ICD-10-CM

## 2019-02-04 DIAGNOSIS — I10 Essential (primary) hypertension: Secondary | ICD-10-CM

## 2019-02-04 NOTE — Progress Notes (Signed)
Subjective:    Patient ID: Jake Garcia, male    DOB: 1929/05/27, 85 y.o.   MRN: [id number] Chief Complaint  Patient presents with  . New Patient (Initial Visit)   Presents as a new patient referred by Dr. [name] for creation of dialysis access.  The patient has a history of chronic kidney disease which is now progressed to stage IV.  At this time, the patient denies any uremic symptoms however if his kidney function continues to decline he will need to start hemodialysis in the future.  The patient is right-hand dominant.  The patient underwent a bilateral upper extremity vein mapping which was notable for biphasic waveforms through the right ulnar / radial arteries with monophasic through the brachial.  Triphasic waveforms through the brachial / radial arteries with biphasic to the ulnar.  Upper extremity vein mapping was revealed both to the creation of a left brachiocephalic AV fistula.  The patient was not a wavelinq candidate.  Patient understands moving forward not to undergo any blood pressure, blood draws or IVs to the right upper extremity.  Patient understands that he will need approximately 4 to 6 weeks for his fistula to mature.  The patient denies any fever, nausea vomiting.  Review of Systems  Constitutional: Negative.   HENT: Negative.   Eyes: Negative.   Respiratory: Negative.   Cardiovascular: Negative.   Gastrointestinal: Negative.   Endocrine: Negative.   Genitourinary: Negative.   Musculoskeletal: Negative.   Skin: Negative.   Allergic/Immunologic: Negative.   Neurological: Negative.   Hematological: Negative.   Psychiatric/Behavioral: Negative.       Objective:   Physical Exam Vitals signs reviewed.  Constitutional:      Appearance: Normal appearance.  HENT:     Head: Normocephalic and atraumatic.     Right Ear: External ear normal.     Left Ear: External ear normal.     Nose: Nose normal.     Mouth/Throat:     Mouth: Mucous membranes are moist.   Pharynx: Oropharynx is clear.  Eyes:     Extraocular Movements: Extraocular movements intact.     Conjunctiva/sclera: Conjunctivae normal.     Pupils: Pupils are equal, round, and reactive to light.  Neck:     Musculoskeletal: Normal range of motion.  Cardiovascular:     Rate and Rhythm: Normal rate and regular rhythm.     Pulses: Normal pulses.     Heart sounds: Normal heart sounds.  Pulmonary:     Effort: Pulmonary effort is normal.     Breath sounds: Normal breath sounds.  Musculoskeletal: Normal range of motion.        General: No swelling.  Skin:    General: Skin is warm and dry.  Neurological:     General: No focal deficit present.     Mental Status: He is alert and oriented to person, place, and time. Mental status is at baseline.  Psychiatric:        Mood and Affect: Mood normal.        Behavior: Behavior normal.        Thought Content: Thought content normal.        Judgment: Judgment normal.    BP (!) 174/73 (BP Location: Left Arm, Patient Position: Sitting, Cuff Size: Small)   Pulse 67   Resp 12   Ht 5\' 7"  (1.702 m)   Wt 149 lb (67.6 kg)   BMI 23.34 kg/m   Past Medical History:  Diagnosis Date  .  Acidosis   . Carotid artery stenosis   . Chronic kidney disease   . CKD (chronic kidney disease), stage IV ([place])   . Coronary artery disease    a. 11/2008 Cath: severe prox LAD dzs, otw nonobs dzs. LAD not felt to be amenable to PCI; b. 11/2008 CABG x 1: LIMA->LAD.  [name] Diastolic dysfunction    a. 09/2015 Echo: EF 65-70%, no rwma, Gr1 DD. Mod dil LA. PASP 51mmHg.  [name] Hyperlipidemia   . Hyperparathyroidism ([place])   . Hypertension   . Prostate cancer ([place])   . Proteinuria   . Right carotid bruit    a. 09/2015 Carotid U/S: <39% bilat ICA stenosis.  . Stage I Colon cancer ([place])    a. 2008 s/p L hemicolectomy ([facility]). 18 neg nodes.   . Syncope 08/2018   Social History   Socioeconomic History  . Marital status: Widowed    Spouse name: Not on file  . Number of  children: Not on file  . Years of education: Not on file  . Highest education level: Not on file  Occupational History  . Not on file  Social Needs  . Financial resource strain: Not hard at all  . Food insecurity:    Worry: Never true    Inability: Never true  . Transportation needs:    Medical: No    Non-medical: No  Tobacco Use  . Smoking status: Never Smoker  . Smokeless tobacco: Never Used  Substance and Sexual Activity  . Alcohol use: No  . Drug use: No  . Sexual activity: Not on file  Lifestyle  . Physical activity:    Days per week: 7 days    Minutes per session: 20 min  . Stress: Not at all  Relationships  . Social connections:    Talks on phone: More than three times a week    Gets together: More than three times a week    Attends religious service: More than 4 times per year    Active member of club or organization: No    Attends meetings of clubs or organizations: Never    Relationship status: Widowed  . Intimate partner violence:    Fear of current or ex partner: No    Emotionally abused: No    Physically abused: No    Forced sexual activity: No  Other Topics Concern  . Not on file  Social History Narrative    Curator x 13 years, Environmental consultant and Patent examiner    Son is Pension scheme manager, Daughters: [name] and [place], [place] pregnant w/ greatgrandkid on the way.   Past Surgical History:  Procedure Laterality Date  . CARDIAC CATHETERIZATION  11/20/2008  . CATARACT EXTRACTION W/PHACO Right 05/10/2016   Procedure: CATARACT EXTRACTION PHACO AND INTRAOCULAR LENS PLACEMENT (IOC);  Surgeon: Birder Robson, MD;  Location: [facility] ORS;  Service: Ophthalmology;  Laterality: Right;  [place] 00:56 AP% 23.7 CDE 13.29 fluid pack lot # [id number]  . CATARACT EXTRACTION W/PHACO Left 01/10/2017   Procedure: CATARACT EXTRACTION PHACO AND INTRAOCULAR LENS PLACEMENT (IOC);  Surgeon: Birder Robson, MD;  Location: [facility] ORS;   Service: Ophthalmology;  Laterality: Left;  [place] 1:01 AP% 17.5 CDE 10.72 Fluid pack lot # [id number]  . COLON SURGERY  2008  . COLONOSCOPY    . CORONARY ARTERY BYPASS GRAFT  11/24/2008  . Right Partial Nephrectomy  2008   a. Performed due to benign lesion.   Family History  Problem Relation Age of Onset  .  Prostate cancer Neg Hx   . Kidney cancer Neg Hx   . Bladder Cancer Neg Hx    No Known Allergies     Assessment & Plan:  Presents as a new patient referred by Dr. [name] for creation of dialysis access.  The patient has a history of chronic kidney disease which is now progressed to stage IV.  At this time, the patient denies any uremic symptoms however if his kidney function continues to decline he will need to start hemodialysis in the future.  The patient is right-hand dominant.  The patient underwent a bilateral upper extremity vein mapping which was notable for biphasic waveforms through the right ulnar / radial arteries with monophasic through the brachial.  Triphasic waveforms through the brachial / radial arteries with biphasic to the ulnar.  Upper extremity vein mapping was revealed both to the creation of a left brachiocephalic AV fistula.  The patient was not a wavelinq candidate.  Patient understands moving forward not to undergo any blood pressure, blood draws or IVs to the right upper extremity.  Patient understands that he will need approximately 4 to 6 weeks for his fistula to mature.  The patient denies any fever, nausea vomiting.  1. Chronic kidney disease (CKD), stage IV (severe) (HCC) - New The patient has a past medical history of chronic kidney disease which is now progressed to stage IV At this time, the patient does not have a dialysis access.  If his kidney function continues to decline he will need to be able to dialyze in the future Upper extremity vein mapping was amenable for creation of a left brachial cephalic AV fistula. Procedure, risks and benefits explained to  the patient.  All questions answered.  The patient wishes to proceed. Patient understands that his fistula will take 4 to 6 weeks to mature The patient is currently not dialyzing Patient is right-hand dominant  2. Pure hypercholesterolemia - Stable Encouraged good control as its slows the progression of atherosclerotic disease  3. Essential hypertension - Stable Encouraged good control as its slows the progression of atherosclerotic disease  Current Outpatient Medications on File Prior to Visit  Medication Sig Dispense Refill  . amLODipine (NORVASC) 10 MG tablet Take 1 tablet (10 mg total) by mouth daily. 90 tablet 3  . ferrous sulfate 325 (65 FE) MG tablet Take 1 tablet (325 mg total) by mouth daily. 90 tablet 3  . Lutein 20 MG CAPS Take by mouth.    . metoprolol succinate (TOPROL-XL) 25 MG 24 hr tablet Take 1 tablet (25 mg total) by mouth daily. 90 tablet 3  . rosuvastatin (CRESTOR) 20 MG tablet Take 1 tablet (20 mg total) by mouth daily. 90 tablet 3  . Zinc 30 MG CAPS Take by mouth as needed.     [name] aspirin EC 81 MG tablet Take 1 tablet (81 mg total) by mouth daily. 90 tablet 3   No current facility-administered medications on file prior to visit.    There are no Patient Instructions on file for this visit. No follow-ups on file.  Milus Fritze A Darlin Stenseth, PA-C

## 2019-02-14 NOTE — Progress Notes (Signed)
Cardiology Office Note Date:  02/15/2019  Patient ID:  Jake Garcia July 02, 1929, MRN [id number] PCP:  [name], NP  Cardiologist:  Dr. Rockey Situ, MD    Chief Complaint: Preoperative cardiac evaluation  History of Present Illness: Jake Garcia is a 85 y.o. male with history of CAD status post one-vessel CABG with a LIMA to LAD in 11/2008 with subsequent PCI of the RCA in 07/2011, CKD stage IV, syncope, hypertension, hyperlipidemia, carotid artery stenosis, colon cancer status post left hemicolectomy in 2008, and prostate cancer who presents for preoperative cardiac evaluation.  Patient underwent cardiac cath in 11/2008 that showed severe proximal LAD disease with otherwise nonobstructive disease.  The LAD was not felt to be amenable to PCI secondary to a high-grade complex stenosis.  The patient subsequently underwent one-vessel bypass with a LIMA to LAD in 11/2008.  Repeat cath in 07/2011 showed 99% mid RCA stenosis status post PCI/DES.  Remaining cath details showed there was 25% stenosis of the left main, the LIMA to LAD was patent with minimal LAD disease, 30% proximal ramus disease, 40% OM to mid vessel disease.  LV gram was not performed given the patient's underlying kidney disease.  Echo from 09/2015 showed an EF of 65 to 70%, LVH, normal wall motion, grade 1 diastolic dysfunction, calcified mitral annulus, dilated left atrium with mildly increased PASP of 37 mmHg.  The patient was seen in the ED in 08/2018 following an episode in which she had not had anything to eat or drink that morning and was apparently standing in line for approximately 30 to 40 minutes when he became nauseated and dizzy.  He slid to the floor, with the assistance of a friend, where he apparently lost consciousness for approximately 1 minute.  EKG showed sinus rhythm with a heart rate of 60 bpm and mild ST depression in V5 and V6 without ST elevations similar to previous EKG, troponin was less than 0.03, and  CBC showed a mild anemia.  It was recommended he stay for observation, however he declined stating he felt "completely normal."  In follow-up with PCP he reported feeling well without further fainting.  Physical exam reported an irregular rhythm as well as heart murmur with associated new lower extremity edema.  EKG in the office at that time showed sinus rhythm with a first-degree AV block as well as a blocked PAC.  In cardiology follow-up in 10/2018, he was doing well.  He did report noncompliance with medications and also noted he sometimes does not take medications when he got "dizzy."  Orthostatic vital signs were negative.  Patient preferred to defer outpatient cardiac monitoring at that time.  He was agreeable to proceeding with echocardiogram as well as carotid artery ultrasound, however he has not yet scheduled either of these tests.  He was also referred to nephrology given his underlying kidney disease.  He was most recently seen in our office by his primary cardiologist on 12/03/2018 and denied any chest pain or shortness of breath.  No changes were made.  He has been referred to vascular surgery by nephrology for vascular access given progressive kidney disease.  In this setting, he has been referred to cardiology for preoperative cardiac evaluation for creation of vascular access for hemodialysis in the future.  Patient comes in doing well from a cardiac perspective.  No chest pain, shortness of breath, dizziness, presyncope, or syncope.  No lower extremity swelling, abdominal distention, orthopnea, PND, early satiety.  Weight remains stable.  The patient reports his girlfriend broke up with him last evening.  He is not distraught over this.  He is able to achieve at least 13.5 METs per the Duke Activity Status Index.  He denies any further dizzy/presyncopal episodes as outlined above.  He does state leading up to those events he had not eaten or drank anything that morning with the event  happening in the afternoon.  He suspects this was the likely culprit for his episode.  He does not have any issues or concerns today.   Past Medical History:  Diagnosis Date  . Acidosis   . Carotid artery stenosis   . Chronic kidney disease   . CKD (chronic kidney disease), stage IV ([place])   . Coronary artery disease    a. 11/2008 Cath: severe prox LAD dzs, otw nonobs dzs. LAD not felt to be amenable to PCI; b. 11/2008 CABG x 1: LIMA->LAD.  [name] Diastolic dysfunction    a. 09/2015 Echo: EF 65-70%, no rwma, Gr1 DD. Mod dil LA. PASP 67mmHg.  [name] Hyperlipidemia   . Hyperparathyroidism ([place])   . Hypertension   . Prostate cancer ([place])   . Proteinuria   . Right carotid bruit    a. 09/2015 Carotid U/S: <39% bilat ICA stenosis.  . Stage I Colon cancer ([place])    a. 2008 s/p L hemicolectomy ([facility]). 18 neg nodes.   . Syncope 08/2018    Past Surgical History:  Procedure Laterality Date  . CARDIAC CATHETERIZATION  11/20/2008  . CATARACT EXTRACTION W/PHACO Right 05/10/2016   Procedure: CATARACT EXTRACTION PHACO AND INTRAOCULAR LENS PLACEMENT (IOC);  Surgeon: Birder Robson, MD;  Location: [facility] ORS;  Service: Ophthalmology;  Laterality: Right;  [place] 00:56 AP% 23.7 CDE 13.29 fluid pack lot # [id number]  . CATARACT EXTRACTION W/PHACO Left 01/10/2017   Procedure: CATARACT EXTRACTION PHACO AND INTRAOCULAR LENS PLACEMENT (IOC);  Surgeon: Birder Robson, MD;  Location: [facility] ORS;  Service: Ophthalmology;  Laterality: Left;  [place] 1:01 AP% 17.5 CDE 10.72 Fluid pack lot # [id number]  . COLON SURGERY  2008  . COLONOSCOPY    . CORONARY ARTERY BYPASS GRAFT  11/24/2008  . Right Partial Nephrectomy  2008   a. Performed due to benign lesion.    Current Meds  Medication Sig  . amLODipine (NORVASC) 10 MG tablet Take 1 tablet (10 mg total) by mouth daily.  . ferrous sulfate 325 (65 FE) MG tablet Take 1 tablet (325 mg total) by mouth daily.  . Lutein 20 MG CAPS Take by mouth.  . metoprolol succinate (TOPROL-XL) 25 MG  24 hr tablet Take 1 tablet (25 mg total) by mouth daily.  . rosuvastatin (CRESTOR) 20 MG tablet Take 1 tablet (20 mg total) by mouth daily.  . Zinc 30 MG CAPS Take by mouth as needed.     Allergies:   Patient has no known allergies.   Social History:  The patient  reports that he has never smoked. He has never used smokeless tobacco. He reports that he does not drink alcohol or use drugs.   Family History:  The patient's family history is not on file.  ROS:   Review of Systems  Constitutional: Positive for malaise/fatigue. Negative for chills, diaphoresis, fever and weight loss.  HENT: Negative for congestion.   Eyes: Negative for discharge and redness.  Respiratory: Negative for cough, hemoptysis, sputum production, shortness of breath and wheezing.   Cardiovascular: Negative for chest pain, palpitations, orthopnea, claudication, leg swelling and PND.  Gastrointestinal: Negative for abdominal  pain, blood in stool, heartburn, melena, nausea and vomiting.  Genitourinary: Negative for hematuria.  Musculoskeletal: Negative for falls and myalgias.  Skin: Positive for itching and rash.  Neurological: Negative for dizziness, tingling, tremors, sensory change, speech change, focal weakness, loss of consciousness and weakness.  Endo/Heme/Allergies: Does not bruise/bleed easily.  Psychiatric/Behavioral: Negative for substance abuse. The patient is not nervous/anxious.   All other systems reviewed and are negative.    PHYSICAL EXAM:  VS:  BP (!) 162/68 (BP Location: Left Arm, Patient Position: Sitting, Cuff Size: Normal)   Pulse (!) 58   Ht 5\' 7"  (1.702 m)   Wt 146 lb 8 oz (66.5 kg)   BMI 22.95 kg/m  BMI: Body mass index is 22.95 kg/m.  Physical Exam  Constitutional: He is oriented to person, place, and time. He appears well-developed and well-nourished.  HENT:  Head: Normocephalic and atraumatic.  Eyes: Right eye exhibits no discharge. Left eye exhibits no discharge.  Neck: Normal  range of motion. No JVD present.  Cardiovascular: Normal rate, regular rhythm, S1 normal and S2 normal. Exam reveals no distant heart sounds, no friction rub, no midsystolic click and no opening snap.  Murmur heard. Pulses:      Posterior tibial pulses are 2+ on the right side and 2+ on the left side.  1/6 systolic murmur best heard in the aortic area  Pulmonary/Chest: Effort normal and breath sounds normal. No respiratory distress. He has no decreased breath sounds. He has no wheezes. He has no rales. He exhibits no tenderness.  Abdominal: Soft. He exhibits no distension. There is no abdominal tenderness.  Musculoskeletal:        General: No edema.  Neurological: He is alert and oriented to person, place, and time.  Skin: Skin is warm and dry. No cyanosis. Nails show no clubbing.  Psychiatric: He has a normal mood and affect. His speech is normal and behavior is normal. Judgment and thought content normal.     EKG:  Was ordered and interpreted by me today. Shows sinus bradycardia, 58 bpm, 1st degree AV block, LAFB, RBBB, nonspecific st/t changes (unchanged)  Recent Labs: 05/21/2018: ALT 8; TSH 2.08 08/19/2018: BUN 63; Creatinine, Ser 3.30; Hemoglobin 8.1; Platelets 204; Potassium 5.1; Sodium 138  05/21/2018: Cholesterol 186; HDL 45; LDL Cholesterol (Calc) 119; Total CHOL/HDL Ratio 4.1; Triglycerides 110   CrCl cannot be calculated (Patient's most recent lab result is older than the maximum 21 days allowed.).   Wt Readings from Last 3 Encounters:  02/15/19 146 lb 8 oz (66.5 kg)  02/04/19 149 lb (67.6 kg)  12/03/18 150 lb 8 oz (68.3 kg)     Other studies reviewed: Additional studies/records reviewed today include: summarized above  ASSESSMENT AND PLAN:  1. Preoperative cardiac evaluation: Patient needs to undergo vascular access for potential hemodialysis in the near future given progression of his underlying kidney disease.  He is well compensated without any symptoms concerning for  ischemia or heart failure.  He is moderate risk per the revised cardiac risk index for noncardiac surgery with an estimated rate of 6.6% for myocardial infarction, pulmonary edema, ventricular fibrillation, cardiac arrest, or complete heart block in the perioperative timeframe.  No further cardiac testing at this time will further reduce this risk.  2. CAD involving the native coronary arteries status post CABG without angina: He is doing well without any symptoms concerning for angina.  Not currently taking aspirin 81 mg daily and prefers not to.  When asked about this, he continues  to defer and states he wants to talk to his son, who is a Pension scheme manager.  If his son recommends taking aspirin, he will start taking this.  No plans for ischemic evaluation at this time.  Aggressive secondary prevention.  3. Diastolic dysfunction/pulmonary hypertension: He appears euvolemic and well compensated.  Not currently requiring daily diuretic.  Schedule echo as below.  4. Cardiac murmur: Schedule echocardiogram which was previously recommended in 2019.  5. CKD stage IV: Followed by nephrology.  Planning for vascular access given progression of kidney disease.  6. Hypertension: Blood pressure is suboptimally controlled today.  Patient refuses adjustment of antihypertensive medication.  He reports he will continue to take amlodipine and Toprol.  Bradycardia prevents escalation of Toprol.  7. Hyperlipidemia: LDL of 119 from 05/2018.  Goal LDL less than 70.  Refuses escalation of Crestor or addition of Zetia.  Continue current dose of Crestor at patient request.  8. Carotid artery stenosis: Previously advised to follow-up with imaging.  Patient refuses at this time.  Disposition: F/u with Dr. Rockey Situ or an APP in 6 months.  Current medicines are reviewed at length with the patient today.  The patient did not have any concerns regarding medicines.  Signed, Christell Faith, PA-C 02/15/2019 10:05 AM     [address] [place], [place] [id number] [phone]

## 2019-02-15 ENCOUNTER — Ambulatory Visit (INDEPENDENT_AMBULATORY_CARE_PROVIDER_SITE_OTHER): Payer: Medicare Other | Admitting: Physician Assistant

## 2019-02-15 ENCOUNTER — Telehealth: Payer: Self-pay

## 2019-02-15 ENCOUNTER — Encounter: Payer: Self-pay | Admitting: Physician Assistant

## 2019-02-15 VITALS — BP 162/68 | HR 58 | Ht 67.0 in | Wt 146.5 lb

## 2019-02-15 DIAGNOSIS — R011 Cardiac murmur, unspecified: Secondary | ICD-10-CM

## 2019-02-15 DIAGNOSIS — I779 Disorder of arteries and arterioles, unspecified: Secondary | ICD-10-CM

## 2019-02-15 DIAGNOSIS — I2510 Atherosclerotic heart disease of native coronary artery without angina pectoris: Secondary | ICD-10-CM

## 2019-02-15 DIAGNOSIS — N184 Chronic kidney disease, stage 4 (severe): Secondary | ICD-10-CM | POA: Diagnosis not present

## 2019-02-15 DIAGNOSIS — Z01810 Encounter for preprocedural cardiovascular examination: Secondary | ICD-10-CM

## 2019-02-15 DIAGNOSIS — I10 Essential (primary) hypertension: Secondary | ICD-10-CM

## 2019-02-15 DIAGNOSIS — E785 Hyperlipidemia, unspecified: Secondary | ICD-10-CM

## 2019-02-15 DIAGNOSIS — I739 Peripheral vascular disease, unspecified: Secondary | ICD-10-CM

## 2019-02-15 NOTE — Patient Instructions (Addendum)
Medication Instructions:  Your physician recommends that you continue on your current medications as directed. Please refer to the Current Medication list given to you today.  If you need a refill on your cardiac medications before your next appointment, please call your pharmacy.   Lab work: None ordered  If you have labs (blood work) drawn today and your tests are completely normal, you will receive your results only by: [name] MyChart Message (if you have MyChart) OR . A paper copy in the mail If you have any lab test that is abnormal or we need to change your treatment, we will call you to review the results.  Testing/Procedures: 1- Echo Echo  Please return to [facility] on ______________ at _______________ AM/PM for an Echocardiogram. Your physician has requested that you have an echocardiogram. Echocardiography is a painless test that uses sound waves to create images of your heart. It provides your doctor with information about the size and shape of your heart and how well your heart's chambers and valves are working. This procedure takes approximately one hour. There are no restrictions for this procedure. Please note; depending on visual quality an IV may need to be placed.    Follow-Up: At [facility], you and your health needs are our priority.  As part of our continuing mission to provide you with exceptional heart care, we have created designated Provider Care Teams.  These Care Teams include your primary Cardiologist (physician) and Advanced Practice Providers (APPs -  Physician Assistants and Nurse Practitioners) who all work together to provide you with the care you need, when you need it. You will need a follow up appointment in 6 months.  Please call our office 2 months in advance to schedule this appointment.  You may see Ida Rogue, MD or Christell Faith, PA-C.

## 2019-02-15 NOTE — Telephone Encounter (Signed)
Call to patient per provider request to see if patient is agreeable to take 81 mg asa daily. Pt reported that he would like to consult with his son, who is a doctor before agreeing to taking daily asa.   Encouraged patient to call back when he decides.   Provider, Christell Faith, PA made aware.

## 2019-02-21 ENCOUNTER — Telehealth (INDEPENDENT_AMBULATORY_CARE_PROVIDER_SITE_OTHER): Payer: Self-pay

## 2019-02-21 NOTE — Telephone Encounter (Signed)
Spoke with the patient to schedule his surgery for a left AVF and patient is questioning if he really needs one. The patient will see his PCP in 2 weeks and will call back after that appt.

## 2019-03-14 ENCOUNTER — Other Ambulatory Visit: Payer: Medicare Other

## 2019-04-03 ENCOUNTER — Telehealth: Payer: Self-pay | Admitting: Cardiovascular Disease

## 2019-04-03 MED ORDER — HYDRALAZINE HCL 50 MG PO TABS: 50.0000 mg | ORAL_TABLET | Freq: Three times a day (TID) | ORAL | 5 refills | Status: DC

## 2019-04-03 NOTE — Telephone Encounter (Signed)
We do not have access to nephrology notes, thus it's unclear why  blocker was d/c'd, but given history, it was most likely 2/2 bradycardia (HR's in 50's in the past).  If he is already taking hydralazine 25mg  TID, reasonable to escalate to 50mg  TID.  Missing doses of hydralazine may result in rebound hypertension.

## 2019-04-03 NOTE — Telephone Encounter (Signed)
Pt c/o BP issue: STAT if pt c/o blurred vision, one-sided weakness or slurred speech  1. What are your last 5 BP readings? Today, just now 178/92 pulse 83 which was good but other days ~188/79 sometimes in the 200s  2. Are you having any other symptoms (ex. Dizziness, headache, blurred vision, passed out)? No - feels fine   3. What is your BP issue? Patient calling, states that his BP has been running high and wants to speak with nurse.  Patient also wants to discuss amlodipine and hydralazine medication.  Please call to discuss.

## 2019-04-03 NOTE — Telephone Encounter (Signed)
Call to patient. He reports high blood pressure x 2 weeks with SBP as high as 220.   He denies changes to salt intake or diet. He denies headache, vision changes or any other assoc sx.   He reports changes in medications from kidney MD 1.5 months ago. Not currently taking metoprolol. Ordered amlodipine (10 mg once daily) and hydralazine (25 mg once daily)to control BP. He increased hydralazine, on his own, to 25 mg 2-3 x daily. He does not feel increase made any difference.   He is not sure why they removed metoprolol from daily meds.   Today 178/92, HR 83 after medication.   Routed to provider to advise.

## 2019-04-03 NOTE — Telephone Encounter (Signed)
Spoke with the pt. Pt made aware of Ignacia Bayley, NP recommendation to increase Hydralazine to 50mg  TID. Pt rsqt an Rx be sent to his pharmacy. Adv the pt to continue to monitor his BP regularly and to contact the office if his readings remain consistently elevated or if symptoms develop. He understands that missing doses of Hydralazine may cause rebound htn, Pt verbalized understanding to the instruction given and voiced appreciation for the call.

## 2019-04-29 ENCOUNTER — Telehealth: Payer: Self-pay | Admitting: Cardiovascular Disease

## 2019-04-29 NOTE — Telephone Encounter (Signed)
Recent recommendation 4/15 per Jake Bayley, NP to increase hydralazine to 50 mg 3 times daily for elevated blood pressure after patient escalated therapy on his own to hydralazine 25mg  TID.    Did the hydralazine help his blood pressure before now? He denied any missed any doses of hydralazine that would result in rebound HTN. What time of day were the blood pressures taken, and did he take his BP before or after taking his medications? Were all the elevated BP readings into the 190s from today? If his BP is consistently elevated into the 190s, or if he develops any concerning symptoms, he should seek urgent care or go to the ED.  It would be helpful to see several days of blood pressure at consistent times and with heart rate documentation, preferably approximately 1-2 hours after taking his BP medication. Please advise him to continue to take his blood pressure at the same time each day and after 5 minutes of being seated comfortably with his cuffed arm supported at the level of his heart and both feet flat on the floor.   Routing to primary cardiologist for further recommendations.

## 2019-04-29 NOTE — Telephone Encounter (Signed)
Pt c/o BP issue: STAT if pt c/o blurred vision, one-sided weakness or slurred speech  1. What are your last 5 BP readings? No readings   2. Are you having any other symptoms (ex. Dizziness, headache, blurred vision, passed out)?    3. What is your BP issue? Spoke with [employer] front desk.  States that patient was requesting to see Dr Rockey Situ or nurse.  Patient did not have any readings but has been taking his BP medication.  Please call patient to discuss.

## 2019-04-29 NOTE — Telephone Encounter (Signed)
Returned call to patient. [facility] wanting to see Dr. Rockey Situ but was routed as call to triage for HTN over the past week.   SBP been in the 190s. HR in the 70s. Wt is at baseline, 150 lbs. Vitals this am BP 190/75, HR 75.  He reports that he has been eating well, no increased stress. No dizziness, chest pain or SOB. No headache. Feels well.   Pt reports compliance with prescribed medications.   Routed to provider to advise.

## 2019-04-30 NOTE — Telephone Encounter (Signed)
Perhaps we could put him on my tele-visit schedule Friday 1:20?

## 2019-05-01 NOTE — Telephone Encounter (Signed)
Patient agreeable to virtual visit on Friday. Opening at 0920 am on Gollan's schedule.      Virtual Visit Pre-Appointment Phone Call  "(Name), I am calling you today to discuss your upcoming appointment. We are currently trying to limit exposure to the virus that causes COVID-19 by seeing patients at home rather than in the office."  1. "What is the BEST phone number to call the day of the visit?" - include this in appointment notes  2. "Do you have or have access to (through a family member/friend) a smartphone with video capability that we can use for your visit?" a. If no - list the appointment type as a PHONE visit in appointment notes  3. Confirm consent - "In the setting of the current Covid19 crisis, you are scheduled for a (phone or video) visit with your provider on (date) at (time).  Just as we do with many in-office visits, in order for you to participate in this visit, we must obtain consent.  If you'd like, I can send this to your mychart (if signed up) or email for you to review.  Otherwise, I can obtain your verbal consent now.  All virtual visits are billed to your insurance company just like a normal visit would be.  By agreeing to a virtual visit, we'd like you to understand that the technology does not allow for your provider to perform an examination, and thus may limit your provider's ability to fully assess your condition. If your provider identifies any concerns that need to be evaluated in person, we will make arrangements to do so.  Finally, though the technology is pretty good, we cannot assure that it will always work on either your or our end, and in the setting of a video visit, we may have to convert it to a phone-only visit.  In either situation, we cannot ensure that we have a secure connection.  Are you willing to proceed?" STAFF: Did the patient verbally acknowledge consent to telehealth visit? Document YES/NO here: yes  4. Advise patient to be prepared - "Two hours  prior to your appointment, go ahead and check your blood pressure, pulse, oxygen saturation, and your weight (if you have the equipment to check those) and write them all down. When your visit starts, your provider will ask you for this information. If you have an Apple Watch or Kardia device, please plan to have heart rate information ready on the day of your appointment. Please have a pen and paper handy nearby the day of the visit as well."  Patient has a BP cuff at home.  5. Give patient instructions for MyChart download to smartphone OR [email or web address] as below if video visit (depending on what platform provider is using)  6. Inform patient they will receive a phone call 15 minutes prior to their appointment time (may be from unknown caller ID) so they should be prepared to answer    TELEPHONE CALL NOTE  Jake Garcia has been deemed a candidate for a follow-up tele-health visit to limit community exposure during the Covid-19 pandemic. I spoke with the patient via phone to ensure availability of phone/video source, confirm preferred email & phone number, and discuss instructions and expectations.  I reminded Jake Garcia to be prepared with any vital sign and/or heart rhythm information that could potentially be obtained via home monitoring, at the time of his visit. I reminded Jake Garcia to expect a phone call prior to his visit.  Lars Pinks  Ricard Dillon, RN 05/01/2019 11:08 AM

## 2019-05-02 NOTE — Progress Notes (Signed)
Virtual Visit via Telephone Note   This visit type was conducted due to national recommendations for restrictions regarding the COVID-19 Pandemic (e.g. social distancing) in an effort to limit this patient's exposure and mitigate transmission in our community.  Due to his co-morbid illnesses, this patient is at least at moderate risk for complications without adequate follow up.  This format is felt to be most appropriate for this patient at this time.  The patient did not have access to video technology/had technical difficulties with video requiring transitioning to audio format only (telephone).  All issues noted in this document were discussed and addressed.  No physical exam could be performed with this format.  Please refer to the patient's chart for his  consent to telehealth for [facility].   I connected with  Jake Garcia on 05/03/19 by a video enabled telemedicine application and verified that I am speaking with the correct person using two identifiers. I discussed the limitations of evaluation and management by telemedicine. The patient expressed understanding and agreed to proceed.   Evaluation Performed:  Follow-up visit  Date:  05/03/2019   ID:  Jake, Garcia 18-Feb-1929, MRN [id number]  Patient Location:  [address] [place] [address]   Provider location:   [facility], [place] office  PCP:  Jake College, NP  Cardiologist:  [place], [place]   Chief Complaint:  High blood pressure   History of Present Illness:    Jake Garcia is a 85 y.o. male who presents via audio/video conferencing for a telehealth visit today.   The patient does not symptoms concerning for COVID-19 infection (fever, chills, cough, or new SHORTNESS OF BREATH).   Patient has a past medical history of coronary artery disease, peripheral vascular disease,   bypass surgery in December 2009 at [facility],  high-grade complex stenosis of the LAD with a LIMA to the LAD  which was off-pump, hyperlipidemia,  hypertension,  episode of chest pain August 2012 with cardiac catheter and stent placement medication noncompliance  Right carotid with less than 39% stenosis, October 2016 Suggested of high-grade right vertebral artery stenosis He presents today for follow-up of his coronary artery disease  For a while SBP >200 with a new cuff "I know why, girlfriend left" "Broke my heart" Reports that he does not trust the blood pressure cuff Today blood pressure running little bit better 379 systolic  Lab work reviewed with him CR 3.3 followed by nephrology ?follow up Previously met with nephrology in [place].  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 carotid disease unchanged from prior studies,  echocardiogram with normal ejection fraction  seen by neurosurgery and recommended to participate in physical therapy Was discharged on a statin but did not fill this prescription   At the time of his cardiac catheterization, He had a 99% mid RCA lesion with stent placed also  noted to have 25% left main, LIMA to the LAD was patent with minimal LAD disease, large ramus with 30% proximal disease, small to moderate OM 2 with 40% mid vessel disease, no LV gram performed. Stent was a Xience 3.5 x 23 mm DES stent.  echocardiogram in December 2009 showed mild LVH,  diastolic dysfunction, mild TR, mildly elevated right ventricular systolic pressures.   Prior CV studies:   The following studies were reviewed today:    Past Medical History:  Diagnosis Date  . Acidosis   . Carotid artery stenosis   . Chronic kidney disease   . CKD (chronic kidney disease), stage IV ([place])   . Coronary artery disease    a. 11/2008 Cath: severe prox LAD dzs, otw nonobs dzs. LAD not felt to be amenable to PCI; b. 11/2008 CABG x 1: LIMA->LAD.  [name] Diastolic dysfunction    a. 09/2015 Echo: EF 65-70%, no rwma, Gr1 DD. Mod dil LA. PASP 32mHg.  .Marland KitchenHyperlipidemia   . [place]parathyroidism ([place]   . Hypertension   . Prostate cancer ([place]   . Proteinuria   . Right carotid bruit    a. 09/2015 Carotid U/S: <39% bilat ICA stenosis.  . Stage I Colon cancer ([place]    a. 2008 s/p L hemicolectomy ([facility]). 18 neg nodes.   . Syncope 08/2018   Past Surgical History:  Procedure Laterality Date  . CARDIAC CATHETERIZATION  11/20/2008  . CATARACT EXTRACTION W/PHACO Right 05/10/2016   Procedure: CATARACT EXTRACTION PHACO AND INTRAOCULAR LENS PLACEMENT (IOC);  Surgeon: WBirder Robson MD;  Location: [facility] ORS;  Service: Ophthalmology;  Laterality: Right;  [place]:56 AP% 23.7 CDE 13.29 fluid pack lot # [id number]  . CATARACT EXTRACTION W/PHACO Left 01/10/2017   Procedure: CATARACT EXTRACTION PHACO AND INTRAOCULAR LENS PLACEMENT (IOC);  Surgeon: WBirder Robson MD;  Location: [facility] ORS;  Service: Ophthalmology;  Laterality: Left;  [place]1:01 AP% 17.5 CDE 10.72 Fluid pack lot # [id number]  . COLON SURGERY  2008  . COLONOSCOPY    . CORONARY ARTERY BYPASS GRAFT  11/24/2008  . Right Partial Nephrectomy  2008   a. Performed due to benign lesion.     Current Meds  Medication Sig  . amLODipine (NORVASC) 10 MG tablet Take 1 tablet (10 mg total) by mouth daily.  . ferrous sulfate 325 (65 FE) MG tablet Take 1 tablet (325 mg total) by mouth daily.  . hydrALAZINE (APRESOLINE) 50 MG tablet Take 1 tablet  (50 mg total) by mouth 3 (three) times daily.  . irbesartan (AVAPRO) 300 MG tablet Take 300 mg by mouth daily.  . Lutein 20 MG CAPS Take by mouth.  . Zinc 30 MG CAPS Take by mouth as needed.      Allergies:   Patient has no known allergies.   Social History   Tobacco Use  . Smoking status: Never Smoker  . Smokeless tobacco: Never Used  Substance Use Topics  . Alcohol use: No  . Drug use: No     Current Outpatient Medications on File Prior to Visit  Medication Sig Dispense Refill  . amLODipine (NORVASC) 10 MG tablet Take 1 tablet (10 mg total) by mouth daily. 90 tablet 3  . ferrous sulfate 325 (65 FE) MG tablet Take 1 tablet (325 mg total) by mouth daily. 90 tablet 3  . hydrALAZINE (APRESOLINE) 50 MG tablet Take 1 tablet (50 mg total) by mouth 3 (three) times daily. 90 tablet 5  . irbesartan (AVAPRO) 300 MG tablet Take 300 mg  by mouth daily.    . Lutein 20 MG CAPS Take by mouth.    . Zinc 30 MG CAPS Take by mouth as needed.      No current facility-administered medications on file prior to visit.      Family Hx: The patient's family history is negative for Prostate cancer, Kidney cancer, and Bladder Cancer.  ROS:   Please see the history of present illness.    Review of Systems  Constitutional: Negative.   HENT: Negative.   Respiratory: Negative.   Cardiovascular: Negative.   Gastrointestinal: Negative.   Musculoskeletal: Negative.   Neurological: Negative.   Psychiatric/Behavioral: The patient is nervous/anxious.   All other systems reviewed and are negative.    Labs/Other Tests and Data Reviewed:    Recent Labs: 05/21/2018: ALT 8; TSH 2.08 08/19/2018: BUN 63; Creatinine, Ser 3.30; Hemoglobin 8.1; Platelets 204; Potassium 5.1; Sodium 138   Recent Lipid Panel Lab Results  Component Value Date/Time   CHOL 186 05/21/2018 08:06 AM   CHOL 185 11/18/2014 08:31 AM   TRIG 110 05/21/2018 08:06 AM   HDL 45 05/21/2018 08:06 AM   HDL 33 (L) 11/18/2014 08:31 AM   CHOLHDL  4.1 05/21/2018 08:06 AM   LDLCALC 119 (H) 05/21/2018 08:06 AM    Wt Readings from Last 3 Encounters:  05/03/19 150 lb (68 kg)  02/15/19 146 lb 8 oz (66.5 kg)  02/04/19 149 lb (67.6 kg)     Exam:    Vital Signs: Vital signs may also be detailed in the HPI BP (!) 150/76 Comment: without meds  Pulse 80   Ht '5\' 6"'  (1.676 m)   Wt 150 lb (68 kg)   BMI 24.21 kg/m   Wt Readings from Last 3 Encounters:  05/03/19 150 lb (68 kg)  02/15/19 146 lb 8 oz (66.5 kg)  02/04/19 149 lb (67.6 kg)   Temp Readings from Last 3 Encounters:  08/23/18 98.6 F (37 C) (Oral)  08/19/18 98.1 F (36.7 C) (Oral)  05/17/18 97.8 F (36.6 C) (Oral)   BP Readings from Last 3 Encounters:  05/03/19 (!) 150/76  02/15/19 (!) 162/68  02/04/19 (!) 174/73   Pulse Readings from Last 3 Encounters:  05/03/19 80  02/15/19 (!) 58  02/04/19 67    150/70, pulse 60, resp 16  Well nourished, well developed male in no acute distress. Constitutional:  oriented to person, place, and time. No distress.    ASSESSMENT & PLAN:    Atherosclerosis of native coronary artery of native heart with stable angina pectoris (HCC) Currently with no symptoms of angina. No further workup at this time. Continue current medication regimen.  PAD (peripheral artery disease) (HCC) We will restart his Crestor 20 mg daily Goal LDL less than 70  Carotid artery disease, unspecified laterality, unspecified type ([place]) We will discuss repeat imaging and follow-up  Hyperlipidemia LDL goal <70 As above, Crestor fell off his list, we will start Crestor 20 daily  Essential hypertension Blood pressure running high, recent stressors, girlfriend left him Recommend he start Cardura 1 mg twice daily in addition to his other medications  Syncope, unspecified syncope type Denies any recent episodes of near syncope or syncope  CKD (chronic kidney disease), stage IV ([place]) Has not had recent follow-up with nephrology Creatinine greater than 3  Stressed importance of aggressive blood pressure control, avoid NSAIDS   COVID-19 Education: The signs and symptoms of COVID-19 were discussed with the patient and how to seek care for testing (follow up  with PCP or arrange E-visit).  The importance of social distancing was discussed today.  Patient Risk:   After full review of this patients clinical status, I feel that they are at least moderate risk at this time.  Time:   Today, I have spent 25 minutes with the patient with telehealth technology discussing the cardiac and medical problems/diagnoses detailed above   10 min spent reviewing the chart prior to patient visit today   Medication Adjustments/Labs and Tests Ordered: Current medicines are reviewed at length with the patient today.  Concerns regarding medicines are outlined above.   Tests Ordered: No tests ordered   Medication Changes: No changes made   Disposition: Follow-up in 6 months   Signed, Ida Rogue, MD  05/03/2019 9:54 AM    [place] Office [address] [place] [address], [place], [place] [address]

## 2019-05-03 ENCOUNTER — Other Ambulatory Visit: Payer: Self-pay

## 2019-05-03 ENCOUNTER — Telehealth (INDEPENDENT_AMBULATORY_CARE_PROVIDER_SITE_OTHER): Payer: Medicare Other | Admitting: Cardiovascular Disease

## 2019-05-03 ENCOUNTER — Telehealth: Payer: Self-pay

## 2019-05-03 VITALS — BP 150/76 | HR 80 | Ht 66.0 in | Wt 150.0 lb

## 2019-05-03 DIAGNOSIS — I739 Peripheral vascular disease, unspecified: Secondary | ICD-10-CM

## 2019-05-03 DIAGNOSIS — I779 Disorder of arteries and arterioles, unspecified: Secondary | ICD-10-CM

## 2019-05-03 DIAGNOSIS — I10 Essential (primary) hypertension: Secondary | ICD-10-CM

## 2019-05-03 DIAGNOSIS — R55 Syncope and collapse: Secondary | ICD-10-CM

## 2019-05-03 DIAGNOSIS — N184 Chronic kidney disease, stage 4 (severe): Secondary | ICD-10-CM

## 2019-05-03 DIAGNOSIS — I25118 Atherosclerotic heart disease of native coronary artery with other forms of angina pectoris: Secondary | ICD-10-CM

## 2019-05-03 DIAGNOSIS — E785 Hyperlipidemia, unspecified: Secondary | ICD-10-CM

## 2019-05-03 MED ORDER — ROSUVASTATIN CALCIUM 20 MG PO TABS: 20.0000 mg | ORAL_TABLET | Freq: Every day | ORAL | 3 refills | Status: DC

## 2019-05-03 MED ORDER — DOXAZOSIN MESYLATE 1 MG PO TABS: 1.0000 mg | ORAL_TABLET | Freq: Two times a day (BID) | ORAL | 3 refills | Status: DC

## 2019-05-03 NOTE — Telephone Encounter (Signed)
The pt son called inquiring when was his dad last PSA and wanting [place] to order his PSA. I informed the pt son that he's not on his father DPR and to have his father to give [place] a call given [place] premission to speak with him regarding his care. He informed me that he was a radiation oncologist and is planning to come down in 2 weeks. His plan is to get his father in to the office for a visit to get him back on track with his medication and also have some bloodwork drawn. He would like to find out what his daddy PSA level is currently, because he have not been seen by Dr. Harrel Carina Urologist  in [place]. The pt had a history of metastatic prostate cancer treated by [facility], but the pt stopped that treatment about 1.5 yrs ago. The son mention if we would be willing to fill out paper work for [facility] for him to come and take care of his father. I explained to him that that would be up to the provider discretion.

## 2019-05-03 NOTE — Patient Instructions (Addendum)
Medication Instructions:  Your physician has recommended you make the following change in your medication:   1) Restart crestor 20 mg daily- take 1 tablet by mouth once daily  2) Start cardura 1 mg - take 1 tablet by mouth twice a day  - Monitor your blood pressure at home  If you need a refill on your cardiac medications before your next appointment, please call your pharmacy.    Lab work: No new labs needed   If you have labs (blood work) drawn today and your tests are completely normal, you will receive your results only by: [name] MyChart Message (if you have MyChart) OR . A paper copy in the mail If you have any lab test that is abnormal or we need to change your treatment, we will call you to review the results.   Testing/Procedures:   Follow-Up: At [facility], you and your health needs are our priority.  As part of our continuing mission to provide you with exceptional heart care, we have created designated Provider Care Teams.  These Care Teams include your primary Cardiologist (physician) and Advanced Practice Providers (APPs -  Physician Assistants and Nurse Practitioners) who all work together to provide you with the care you need, when you need it.  . You will need a follow up appointment in 6 months .   Please call our office 2 months in advance to schedule this appointment. (call in early September to schedule)   . Providers on your designated Care Team:   . Murray Hodgkins, NP . Christell Faith, PA-C . Marrianne Mood, PA-C  Any Other Special Instructions Will Be Listed Below (If Applicable).  For educational health videos Log in to : [email or web address] Or : [email or web address], password : [email or web address]

## 2019-05-03 NOTE — Telephone Encounter (Signed)
Covering inbox for Jake Garcia, AGPCNP-BC while she is out of office on maternity leave.  Reviewed patient's chart and this request.  Patient has a complex medical history of metastatic prostate cancer, s/p ADT treatment temporarily in 2018, and also significant Kidney Failure, cardiovascular problems with heart disease and high blood pressure.  It seems patient has had problems with adhering to medical treatment plans.  His last visit with PCP was 10/2018.  Here are my thoughts:  1. Patient will need to re-establish with Urology if the primary concern is to get updated on his prostate cancer situation and treatment options. - Previously followed by [facility] as well as [facility] locally - We can place new referral if needed - otherwise perhaps patient can call and schedule with them without a referral since he was established < 2 years ago.  2. Regarding, a general medical evaluation, labs, and FMLA for the patient's son, this is reasonable - I am hesitant to provide this level of care for a patient that I am not familiar with. It sounds like we do not have much option if the son is already scheduled or planning to come down to [place] in 2 weeks by beginning of June.  - One option if they are able, would be to defer until July upon PCP return for this evaluation and in meantime they could see Urologist to get updates from them.  - If they cannot wait until July, then I would be willing to see this patient when they are ready in a few weeks with son, and we can do a general medical evaluation primarily focus on Blood pressure, Kidney, and Prostate CA. - If scheduling with me, he will need a lab only apt 1 week before his office visit, if he is able to schedule with Urology first, then we would not check his PSA, but if he cannot get in or they prefer to wait - we can check PSA and then refer him at that visit. Regardless, he will need to restart with Urology - as I would not treat metastatic prostate  cancer here.  - I would be able to complete FMLA at that time if needed for son based on father's illness. Depending on exactly what son is requesting.  Nobie Putnam, DO [facility] 05/03/2019, 4:59 PM

## 2019-05-03 NOTE — Telephone Encounter (Signed)
The pt call back and gave verbal consent to speak with his son  Jake Garcia Radiation Oncologist to get an appt scheduled [phone]. Please let me know how you would like me to schedule this appt.

## 2019-05-06 ENCOUNTER — Other Ambulatory Visit: Payer: Self-pay | Admitting: Family Medicine

## 2019-05-06 DIAGNOSIS — D509 Iron deficiency anemia, unspecified: Secondary | ICD-10-CM

## 2019-05-06 DIAGNOSIS — I10 Essential (primary) hypertension: Secondary | ICD-10-CM

## 2019-05-06 DIAGNOSIS — I2510 Atherosclerotic heart disease of native coronary artery without angina pectoris: Secondary | ICD-10-CM

## 2019-05-06 DIAGNOSIS — I129 Hypertensive chronic kidney disease with stage 1 through stage 4 chronic kidney disease, or unspecified chronic kidney disease: Secondary | ICD-10-CM

## 2019-05-06 DIAGNOSIS — N184 Chronic kidney disease, stage 4 (severe): Secondary | ICD-10-CM

## 2019-05-06 DIAGNOSIS — C61 Malignant neoplasm of prostate: Secondary | ICD-10-CM

## 2019-05-06 DIAGNOSIS — R7309 Other abnormal glucose: Secondary | ICD-10-CM

## 2019-05-06 NOTE — Telephone Encounter (Addendum)
The pt son called back and I notified him of Dr. Marthann Schiller recommendation. He want to proceed with scheduling his father for an appt on next Tuesday or Wednesday, because he's coming into town. The pt was scheduled for Tuesday, May 26th at 8:45am for labs only and Wednesday, May 27th @ 2:40pm for the consult with the son.

## 2019-05-06 NOTE — Telephone Encounter (Signed)
Attempted to contact the pt, no answer. LMOM to return my call.  

## 2019-05-14 ENCOUNTER — Other Ambulatory Visit: Payer: Self-pay

## 2019-05-14 ENCOUNTER — Telehealth: Payer: Self-pay

## 2019-05-14 DIAGNOSIS — R7309 Other abnormal glucose: Secondary | ICD-10-CM | POA: Diagnosis not present

## 2019-05-14 DIAGNOSIS — D509 Iron deficiency anemia, unspecified: Secondary | ICD-10-CM | POA: Diagnosis not present

## 2019-05-14 DIAGNOSIS — C61 Malignant neoplasm of prostate: Secondary | ICD-10-CM

## 2019-05-14 DIAGNOSIS — I10 Essential (primary) hypertension: Secondary | ICD-10-CM | POA: Diagnosis not present

## 2019-05-14 DIAGNOSIS — I2510 Atherosclerotic heart disease of native coronary artery without angina pectoris: Secondary | ICD-10-CM

## 2019-05-14 DIAGNOSIS — N184 Chronic kidney disease, stage 4 (severe): Secondary | ICD-10-CM

## 2019-05-14 DIAGNOSIS — I129 Hypertensive chronic kidney disease with stage 1 through stage 4 chronic kidney disease, or unspecified chronic kidney disease: Secondary | ICD-10-CM | POA: Diagnosis not present

## 2019-05-14 NOTE — Telephone Encounter (Signed)
Pt son called requesting that we fax over lab results to the pt Nephrologist Dr. [name] at [facility] .

## 2019-05-15 ENCOUNTER — Other Ambulatory Visit: Payer: Self-pay

## 2019-05-15 ENCOUNTER — Encounter: Payer: Self-pay | Admitting: Family Medicine

## 2019-05-15 ENCOUNTER — Ambulatory Visit (INDEPENDENT_AMBULATORY_CARE_PROVIDER_SITE_OTHER): Payer: Medicare Other | Admitting: Family Medicine

## 2019-05-15 ENCOUNTER — Ambulatory Visit: Payer: Self-pay | Admitting: Family Medicine

## 2019-05-15 VITALS — BP 158/58 | HR 70 | Temp 98.8°F | Resp 16 | Ht 67.0 in | Wt 146.0 lb

## 2019-05-15 DIAGNOSIS — N186 End stage renal disease: Secondary | ICD-10-CM | POA: Diagnosis not present

## 2019-05-15 DIAGNOSIS — N401 Enlarged prostate with lower urinary tract symptoms: Secondary | ICD-10-CM | POA: Diagnosis not present

## 2019-05-15 DIAGNOSIS — G3184 Mild cognitive impairment, so stated: Secondary | ICD-10-CM

## 2019-05-15 DIAGNOSIS — C61 Malignant neoplasm of prostate: Secondary | ICD-10-CM | POA: Diagnosis not present

## 2019-05-15 DIAGNOSIS — N138 Other obstructive and reflux uropathy: Secondary | ICD-10-CM

## 2019-05-15 LAB — COMPLETE METABOLIC PANEL WITH GFR
AG Ratio: 1.6 (calc) (ref 1.0–2.5)
ALT: 6 U/L — ABNORMAL LOW (ref 9–46)
AST: 10 U/L (ref 10–35)
Albumin: 3.9 g/dL (ref 3.6–5.1)
Alkaline phosphatase (APISO): 75 U/L (ref 35–144)
BUN/Creatinine Ratio: 21 (calc) (ref 6–22)
BUN: 82 mg/dL — ABNORMAL HIGH (ref 7–25)
CO2: 17 mmol/L — ABNORMAL LOW (ref 20–32)
Calcium: 9 mg/dL (ref 8.6–10.3)
Chloride: 114 mmol/L — ABNORMAL HIGH (ref 98–110)
Creat: 3.96 mg/dL — ABNORMAL HIGH (ref 0.70–1.11)
GFR, Est African American: 15 mL/min/{1.73_m2} — ABNORMAL LOW (ref >=60)
GFR, Est Non African American: 13 mL/min/{1.73_m2} — ABNORMAL LOW (ref >=60)
Globulin: 2.5 g/dL (calc) (ref 1.9–3.7)
Glucose, Bld: 129 mg/dL — ABNORMAL HIGH (ref 65–99)
Potassium: 5.4 mmol/L — ABNORMAL HIGH (ref 3.5–5.3)
Sodium: 141 mmol/L (ref 135–146)
Total Bilirubin: 0.4 mg/dL (ref 0.2–1.2)
Total Protein: 6.4 g/dL (ref 6.1–8.1)

## 2019-05-15 LAB — LIPID PANEL
Cholesterol: 170 mg/dL (ref <200)
HDL: 35 mg/dL — ABNORMAL LOW (ref >=40)
LDL Cholesterol (Calc): 109 mg/dL (calc) — ABNORMAL HIGH
Non-HDL Cholesterol (Calc): 135 mg/dL (calc) — ABNORMAL HIGH (ref <130)
Total CHOL/HDL Ratio: 4.9 (calc) (ref <5.0)
Triglycerides: 149 mg/dL (ref <150)

## 2019-05-15 LAB — CBC WITH DIFFERENTIAL/PLATELET
Absolute Monocytes: 479 cells/uL (ref 200–950)
Basophils Absolute: 23 cells/uL (ref 0–200)
Basophils Relative: 0.3 %
Eosinophils Absolute: 494 cells/uL (ref 15–500)
Eosinophils Relative: 6.5 %
HCT: 28 % — ABNORMAL LOW (ref 38.5–50.0)
Hemoglobin: 8.3 g/dL — ABNORMAL LOW (ref 13.2–17.1)
Lymphs Abs: 783 cells/uL — ABNORMAL LOW (ref 850–3900)
MCH: 18.9 pg — ABNORMAL LOW (ref 27.0–33.0)
MCHC: 29.6 g/dL — ABNORMAL LOW (ref 32.0–36.0)
MCV: 63.6 fL — ABNORMAL LOW (ref 80.0–100.0)
Monocytes Relative: 6.3 %
Neutro Abs: 5822 cells/uL (ref 1500–7800)
Neutrophils Relative %: 76.6 %
Platelets: 252 10*3/uL (ref 140–400)
RBC: 4.4 10*6/uL (ref 4.20–5.80)
RDW: 19.4 % — ABNORMAL HIGH (ref 11.0–15.0)
Total Lymphocyte: 10.3 %
WBC: 7.6 10*3/uL (ref 3.8–10.8)

## 2019-05-15 LAB — HEMOGLOBIN A1C
Hgb A1c MFr Bld: 5.6 % of total Hgb (ref <5.7)
Mean Plasma Glucose: 114 (calc)
eAG (mmol/L): 6.3 (calc)

## 2019-05-15 LAB — IRON,TIBC AND FERRITIN PANEL
%SAT: 26 % (calc) (ref 20–48)
Ferritin: 20 ng/mL — ABNORMAL LOW (ref 24–380)
Iron: 80 ug/dL (ref 50–180)
TIBC: 309 mcg/dL (calc) (ref 250–425)

## 2019-05-15 LAB — PSA: PSA: 7.1 ng/mL — ABNORMAL HIGH (ref <=4.0)

## 2019-05-15 LAB — CBC MORPHOLOGY

## 2019-05-15 NOTE — Progress Notes (Signed)
Subjective:    Patient ID: Jake Garcia, male    DOB: [birth year], 85 y.o.   MRN: [id number]  RENE SIZELOVE is a 85 y.o. male presenting on 05/15/2019 for FMLA; Prostate Cancer; Chronic Renal Failure (ESRD); and Dementia  History primarily provided by patient's son, Arney Mayabb, who accompanies his father in office. Patient, Jake Garcia, has cognitive decline and does not provide as much history today.  PCP is Cassell Smiles, AGPCNP-BC - I am currently covering during her maternity leave.   HPI   Patient's son [name]) is requesting completion of FMLA paperwork to provide increased caregiver support to his father, who has had declining health. His employer is hospital in [place], he works as Pension scheme manager.  [place], metastatic Followed by [facility] - Dr Louis Meckel, last seen 10/2018, has had monitoring of PSA trend over past 3 years after prior diagnosis, and treatment with antagonist therapy, reviewed records from Urology, last note, prior trend with improved PSA to 3 range, and now lab results reviewed PSA up to 7.1 (04/2019) - Prior imaging has shown metastatic prostate cancer to various locations, including bone - Patient currently has some symptoms of BPH and urinary as well as erectile dysfunction - He has some chronic pain but seems associated with MSK spinal issues  CKD progression to ESRD / Elevated BUN vs uremia Followed by Dr [name] [facility], last seen this morning. Recent labs show Cr 3.96 and BUN 82, with mild elevated K up to 5.4, previously has been similar range. - Treatment from Nephrology is now anticipating Hemodialysis in near future, with plans for referral to Vascular for AV access - Patient has endorsed more increasing fatigue and lethargy - History of anemia of chronic disease in past, now worse with progression kidney dysfunction, with Hgb 8s  Early Dementia vs Mild Cognitive Impairment / Memory Loss Reported to gradual worsen over past few  months, now seems affecting him more. His wife passed 1.5 years ago and she primarily has kept up with housework and now he has not been able to maintain the house as well, now having more family support, and son planning to step in as a primary caregiver for him. 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 Smoking status: Never Smoker  . Smokeless tobacco: Never Used  Substance Use Topics  . Alcohol use: No  . Drug use: No    Review of Systems Per HPI unless specifically indicated above     Objective:    BP (!) 158/58   Pulse 70   Temp 98.8 F (37.1 C) (Oral)   Resp 16   Ht 5\' 7"  (1.702 m)   Wt 146 lb (66.2 kg)   BMI 22.87 kg/m   Wt Readings from Last 3 Encounters:  05/15/19 146 lb (66.2 kg)  05/03/19 150 lb (68 kg)  02/15/19 146 lb 8 oz (66.5 kg)    Physical Exam Vitals signs and nursing  note reviewed.  Constitutional:      General: He is not in acute distress.    Appearance: He is well-developed. He is not diaphoretic.     Comments: Well-appearing, comfortable, cooperative  HENT:     Head: Normocephalic and atraumatic.  Eyes:     General:        Right eye: No discharge.        Left eye: No discharge.     Conjunctiva/sclera: Conjunctivae normal.  Cardiovascular:     Rate and Rhythm: Normal rate.  Pulmonary:     Effort: Pulmonary effort is normal.  Skin:    General: Skin is warm and dry.     Findings: No erythema or rash.  Neurological:     Mental  Status: He is alert and oriented to person, place, and time.  Psychiatric:        Behavior: Behavior normal.     Comments: Well groomed, good eye contact, normal speech and thoughts        Results for orders placed or performed in visit on 05/14/19  Iron, TIBC and Ferritin Panel  Result Value Ref Range   Iron 80 50 - 180 mcg/dL   TIBC 309 250 - 425 mcg/dL (calc)   %SAT 26 20 - 48 % (calc)   Ferritin 20 (L) 24 - 380 ng/mL  PSA  Result Value Ref Range   PSA 7.1 (H) < OR = 4.0 ng/mL  Lipid panel  Result Value Ref Range   Cholesterol 170 <200 mg/dL   HDL 35 (L) > OR = 40 mg/dL   Triglycerides 149 <150 mg/dL   LDL Cholesterol (Calc) 109 (H) mg/dL (calc)   Total CHOL/HDL Ratio 4.9 <5.0 (calc)   Non-HDL Cholesterol (Calc) 135 (H) <130 mg/dL (calc)  COMPLETE METABOLIC PANEL WITH GFR  Result Value Ref Range   Glucose, Bld 129 (H) 65 - 99 mg/dL   BUN 82 (H) 7 - 25 mg/dL   Creat 3.96 (H) 0.70 - 1.11 mg/dL   GFR, Est Non African American 13 (L) > OR = 60 mL/min/1.2m2   GFR, Est African American 15 (L) > OR = 60 mL/min/1.81m2   BUN/Creatinine Ratio 21 6 - 22 (calc)   Sodium 141 135 - 146 mmol/L   Potassium 5.4 (H) 3.5 - 5.3 mmol/L   Chloride 114 (H) 98 - 110 mmol/L   CO2 17 (L) 20 - 32 mmol/L   Calcium 9.0 8.6 - 10.3 mg/dL   Total Protein 6.4 6.1 - 8.1 g/dL   Albumin 3.9 3.6 - 5.1 g/dL   Globulin 2.5 1.9 - 3.7 g/dL (calc)   AG Ratio 1.6 1.0 - 2.5 (calc)   Total Bilirubin 0.4 0.2 - 1.2 mg/dL   Alkaline phosphatase (APISO) 75 35 - 144 U/L   AST 10 10 - 35 U/L   ALT 6 (L) 9 - 46 U/L  CBC with Differential/Platelet  Result Value Ref Range   WBC 7.6 3.8 - 10.8 Thousand/uL   RBC 4.40 4.20 - 5.80 Million/uL   Hemoglobin 8.3 (L) 13.2 - 17.1 g/dL   HCT 28.0 (L) 38.5 - 50.0 %   MCV 63.6 (L) 80.0 - 100.0 fL   MCH 18.9 (L) 27.0 - 33.0 pg   MCHC 29.6 (L) 32.0 - 36.0 g/dL   RDW 19.4 (H) 11.0 - 15.0 %   Platelets 252 140 - 400 Thousand/uL   MPV  7.5 - 12.5 fL   Neutro Abs 5,822 1,500  - 7,800 cells/uL  Lymphs Abs 783 (L) 850 - 3,900 cells/uL   Absolute Monocytes 479 200 - 950 cells/uL   Eosinophils Absolute 494 15 - 500 cells/uL   Basophils Absolute 23 0 - 200 cells/uL   Neutrophils Relative % 76.6 %   Total Lymphocyte 10.3 %   Monocytes Relative 6.3 %   Eosinophils Relative 6.5 %   Basophils Relative 0.3 %   Smear Review    Hemoglobin A1c  Result Value Ref Range   Hgb A1c MFr Bld 5.6 <5.7 % of total Hgb   Mean Plasma Glucose 114 (calc)   eAG (mmol/L) 6.3 (calc)  CBC MORPHOLOGY  Result Value Ref Range   CBC MORPHOLOGY  NORMAL      Assessment & Plan:   Problem List Items Addressed This Visit    BPH with obstruction/lower urinary tract symptoms   ESRD (end stage renal disease) (HCC)   Mild cognitive impairment with memory loss   Prostate cancer metastatic to multiple sites [facility]) - Primary      Clinically constellation of progressive worsening end stage problems currently with primary issues involving metastatic prostate cancer now with increased PSA >7, has been off therapy for >6 months, and suspect rebound of prostate cancer - Additionally with progression CKD to ESRD now anticipating HD in future once can establish with vascular access, multiple complications related to clinical manifestation of renal failure, with elevated BUN and K and lethargy, closely followed by Nephrology now, has had recent labs reviewed by Nephro. - Lastly with cognitive decline early dementia and memory loss he has more difficulty providing care for himself with routine ADLs, now requiring additional assistance at home, for admin his medications, meals, hygiene, medical and emotional support, transportation to doctors visit  Treatment - Return to [facility] Dr Louis Meckel for re-consultation on prostate cancer, with elevated PSA >7 seems likely to have rebounded and would likely need further hormonal therapy if proceeding with treatment - Follow closely with Nephrology for  proceeding with HD access and management of ESRD, including elevated K and BUN as discussed today  Completed FMLA paperwork today for patient's son, Cejay Cambre, anticipate a period of incapacity from 06/24/19 to 07/12/19 with onset of hemodialysis will require additional care as documented. Also will benefit form intermittent leave up to 9 hours 2-3 days a week onset 05/20/19 through 06/23/19. Also may experience flare-ups. See documentation to be scanned.  No orders of the defined types were placed in this encounter.   Follow up plan: Return in about 2 months (around 07/15/2019) for Updates Prostate Cancer / ESRD on HD / Cognitive decline.  A total of 28 minutes was spent face-to-face with this patient. Greater than 50% of this time (approximately 15 minutes) was spent in counseling on resuming management for prostate cancer and treatment/prognosis, return to Urology. Coordination of care with completing FMLA paperwork for patient's son to serve as primary caregiver for father.  Nobie Putnam, [facility] 05/15/2019, 4:01 PM

## 2019-05-15 NOTE — Patient Instructions (Addendum)
Thank you for coming to the office today.  Please return to Dr Louis Meckel [facility] for further Urological consultation regarding prostate cancer treatment.  Follow-up with Nephrology regarding future hemodialysis.  Please schedule a Follow-up Appointment to: Return in about 2 months (around 07/15/2019) for Updates Prostate Cancer / ESRD on HD / Cognitive decline.  If you have any other questions or concerns, please feel free to call the office or send a message through [place]. You may also schedule an earlier appointment if necessary.  Additionally, you may be receiving a survey about your experience at our office within a few days to 1 week by e-mail or mail. We value your feedback.  Nobie Putnam, DO [place]

## 2019-07-04 ENCOUNTER — Telehealth: Payer: Self-pay | Admitting: Cardiovascular Disease

## 2019-07-04 NOTE — Telephone Encounter (Signed)
Patient's son is calling regarding patient's 3 medicaitons. He would like to know if any can be combined.  Hydralazine, Amlodipine, and Arbesartan.

## 2019-07-04 NOTE — Telephone Encounter (Signed)
Pt c/o BP issue: STAT if pt c/o blurred vision, one-sided weakness or slurred speech  1. What are your last 5 BP readings? 015-868 systolic   2. Are you having any other symptoms (ex. Dizziness, headache, blurred vision, passed out)?  Denies symptoms   3. What is your BP issue? Patient wants to know which bp med to take prn for getting systolic pressure down

## 2019-07-05 NOTE — Telephone Encounter (Signed)
Patients son calling to check on status  Please advise, best number to reach is [phone]

## 2019-07-05 NOTE — Telephone Encounter (Signed)
I spoke to the patient's son [name]) who is a physician and would like to speak with Dr Rockey Situ or Jeannene Patella about his father's medication for BP.  Please call son Monday 7/20 [phone].  Thank you

## 2019-07-07 NOTE — Telephone Encounter (Signed)
Can we contact the son, if you would like further extensive discussion we can arrange a web visit with patient and son It would seem from the triage note blood pressure is running high,  My last clinic visit I started Cardura but it seems he is not taking this? This would help his blood pressure and can be titrated upwards with better blood pressure control, safe for the kidneys  We can consolidate the ARB and calcium channel blocker into 1 pill Hydralazine does not have a combo pill, we can increase the dose up to 100 3 times daily  High blood pressure is going to be difficult to manage in the setting of renal failure

## 2019-07-08 NOTE — Telephone Encounter (Signed)
Patients son is calling back to hear the advice. Please call the son back

## 2019-07-09 NOTE — Telephone Encounter (Signed)
Spoke with the pt son [name]. Made [name] aware of Dr.Gollan's recommendation below. [name] sts that he would like to have a virtual visit with Dr.Golla to discuss the pt medications. [name] who is physician sts that the pt needs medication consolidation. The pt is having issues with medication compliance  due to his declining cognitive impairment.  [name] rqst a telemedicine visit with Dr.Gollan. appt scheduled for 06/2219 @ 10:40am Pt has previously given permission for virtual visits. Asked that the pt recent VS be available for the appt. [name] will be in a different location for the visit. Ok to convert from virtual to a telephone visit if needed

## 2019-07-09 NOTE — Progress Notes (Signed)
Virtual Visit via Telephone Note   This visit type was conducted due to national recommendations for restrictions regarding the COVID-19 Pandemic (e.g. social distancing) in an effort to limit this patient's exposure and mitigate transmission in our community.  Due to his co-morbid illnesses, this patient is at least at moderate risk for complications without adequate follow up.  This format is felt to be most appropriate for this patient at this time.  The patient did not have access to video technology/had technical difficulties with video requiring transitioning to audio format only (telephone).  All issues noted in this document were discussed and addressed.  No physical exam could be performed with this format.  Please refer to the patient's chart for his  consent to telehealth for [facility].   I connected with  Jake Garcia on 07/09/19 by a video enabled telemedicine application and verified that I am speaking with the correct person using two identifiers. I discussed the limitations of evaluation and management by telemedicine. The patient expressed understanding and agreed to proceed.   Evaluation Performed:  Follow-up visit  Date:  07/09/2019   ID:  Jake Garcia, Jake Garcia 1928/12/30, MRN [id number]  Patient Location:  [address] [place] [address]   Provider location:   [facility], [place] office  PCP:  [name], NP  Cardiologist:  [place], [place]   Chief Complaint:  High blood pressure  History of Present Illness:    Jake Garcia is a 85 y.o. male who presents via audio/video conferencing for a telehealth visit today.   The patient does not symptoms concerning for COVID-19 infection (fever, chills, cough, or new SHORTNESS OF BREATH).   Patient has a past medical history of coronary artery disease, peripheral vascular disease,   bypass surgery in December 2009 at [facility],  high-grade complex stenosis of the LAD with a LIMA to the LAD  which was off-pump, hyperlipidemia,  hypertension,  episode of chest pain August 2012 with cardiac catheter and stent placement medication noncompliance  Right carotid with less than 39% stenosis, October 2016 Suggested of high-grade right vertebral artery stenosis He presents today for follow-up of his coronary artery disease  Patient son is a physician, reaching out to discuss his hypertension Last telemetry visit May 2020 At that time was upset after break-up with his girlfriend Blood pressure is running 967 systolic  CR 3.3 followed by nephrology  Son reports that patient is not taking his medications on a consistent basis, throws them away half the time He would like to consolidate his medications and he will arrange a pillbox Does not appear that he is taking Crestor aspirin Might have taken Cardura 4 mg provided by nephrology, had a near syncope passout spell hit his head getting out of bed No longer takes Cardura  On a prior clinic visit we had started Cardura 1 twice daily, son thinks it might have helped his BPH/nocturia  No regular exercise program, having more cognitive issues  Last year reports he Lost his wife  Continues to have adjustment d/o Died from complications of treatment for non-Hodgkin's lymphoma  History of prostate cancer  Low testosterone History of right arm, restricted movement, atrophy of the thenar muscles right hand  Previous hospital admission for possible stroke, found to have 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 carotid disease unchanged from prior studies,  echocardiogram with normal ejection fraction  seen by neurosurgery and recommended to participate in  physical therapy Was discharged on a statin but did not fill this prescription   At the time of his cardiac catheterization, He had a 99% mid RCA lesion with stent placed also noted to have 25% left main, LIMA to the LAD was patent with minimal LAD disease,  large ramus with 30% proximal disease, small to moderate OM 2 with 40% mid vessel disease, no LV gram performed. Stent was a Xience 3.5 x 23 mm DES stent.  echocardiogram in December 2009 showed mild LVH, diastolic dysfunction, mild TR, mildly elevated right ventricular systolic pressures.   Prior CV studies:   The following studies were reviewed today:    Past Medical History:  Diagnosis Date  . Acidosis   . Carotid artery stenosis   . Chronic kidney disease   . CKD (chronic kidney disease), stage IV ([place])   . Coronary artery disease    a. 11/2008 Cath: severe prox LAD dzs, otw nonobs dzs. LAD not felt to be amenable to PCI; b. 11/2008 CABG x 1: LIMA->LAD.  [name] Diastolic dysfunction    a. 09/2015 Echo: EF 65-70%, no rwma, Gr1 DD. Mod dil LA. PASP 46mmHg.  [name] Hyperlipidemia   . Hyperparathyroidism ([place])   . Hypertension   . Prostate cancer ([place])   . Proteinuria   . Right carotid bruit    a. 09/2015 Carotid U/S: <39% bilat ICA stenosis.  . Stage I Colon cancer ([place])    a. 2008 s/p L hemicolectomy ([facility]). 18 neg nodes.   . Syncope 08/2018   Past Surgical History:  Procedure Laterality Date  . CARDIAC CATHETERIZATION  11/20/2008  . CATARACT EXTRACTION W/PHACO Right 05/10/2016   Procedure: CATARACT EXTRACTION PHACO AND INTRAOCULAR LENS PLACEMENT (IOC);  Surgeon: Birder Robson, MD;  Location: [facility] ORS;  Service: Ophthalmology;  Laterality: Right;  [place] 00:56 AP% 23.7 CDE 13.29 fluid pack lot # [id number]  . CATARACT EXTRACTION W/PHACO Left 01/10/2017   Procedure: CATARACT EXTRACTION PHACO AND INTRAOCULAR LENS PLACEMENT (IOC);  Surgeon: Birder Robson, MD;  Location: [facility] ORS;  Service: Ophthalmology;  Laterality: Left;  [place] 1:01 AP% 17.5 CDE 10.72 Fluid pack lot # [id number]  . COLON SURGERY  2008  . COLONOSCOPY    . CORONARY ARTERY BYPASS GRAFT  11/24/2008  . Right Partial Nephrectomy  2008   a. Performed due to benign lesion.     No outpatient medications have been marked as  taking for the 07/10/19 encounter (Appointment) with Minna Merritts, MD.     Allergies:   Patient has no known allergies.   Social History   Tobacco Use  . Smoking status: Never Smoker  . Smokeless tobacco: Never Used  Substance Use Topics  . Alcohol use: No  . Drug use: No     Current Outpatient Medications on File Prior to Visit  Medication Sig Dispense Refill  . amLODipine (NORVASC) 10 MG tablet Take 1 tablet (10 mg total) by mouth daily. 90 tablet 3  . doxazosin (CARDURA) 1 MG tablet Take 1 tablet (1 mg total) by mouth 2 (two) times daily. (Patient not taking: Reported on 05/15/2019) 180 tablet 3  . ferrous sulfate 325 (65 FE) MG tablet Take 1 tablet (325 mg total) by mouth daily. 90 tablet 3  . hydrALAZINE (APRESOLINE) 50 MG tablet Take 1 tablet (50 mg total) by mouth 3 (three) times daily. 90 tablet 5  . irbesartan (AVAPRO) 300 MG tablet Take 300 mg by mouth daily.    . Lutein 20 MG CAPS Take by  mouth.    . rosuvastatin (CRESTOR) 20 MG tablet Take 1 tablet (20 mg total) by mouth daily. (Patient not taking: Reported on 05/15/2019) 90 tablet 3  . Zinc 30 MG CAPS Take by mouth as needed.      No current facility-administered medications on file prior to visit.      Family Hx: The patient's family history is negative for Prostate cancer, Kidney cancer, and Bladder Cancer.  ROS:   Please see the history of present illness.    Review of Systems  Constitutional: Negative.   HENT: Negative.   Respiratory: Negative.   Cardiovascular: Negative.   Gastrointestinal: Negative.   Musculoskeletal: Negative.   Neurological: Negative.   Psychiatric/Behavioral: The patient is nervous/anxious.   All other systems reviewed and are negative.    Labs/Other Tests and Data Reviewed:    Recent Labs: 05/14/2019: ALT 6; BUN 82; Creat 3.96; Hemoglobin 8.3; Platelets 252; Potassium 5.4; Sodium 141   Recent Lipid Panel Lab Results  Component Value Date/Time   CHOL 170 05/14/2019 08:28 AM    CHOL 185 11/18/2014 08:31 AM   TRIG 149 05/14/2019 08:28 AM   HDL 35 (L) 05/14/2019 08:28 AM   HDL 33 (L) 11/18/2014 08:31 AM   CHOLHDL 4.9 05/14/2019 08:28 AM   LDLCALC 109 (H) 05/14/2019 08:28 AM    Wt Readings from Last 3 Encounters:  05/15/19 146 lb (66.2 kg)  05/03/19 150 lb (68 kg)  02/15/19 146 lb 8 oz (66.5 kg)     Exam:    Vital Signs: Vital signs may also be detailed in the HPI There were no vitals taken for this visit.  Wt Readings from Last 3 Encounters:  05/15/19 146 lb (66.2 kg)  05/03/19 150 lb (68 kg)  02/15/19 146 lb 8 oz (66.5 kg)   Temp Readings from Last 3 Encounters:  05/15/19 98.8 F (37.1 C) (Oral)  08/23/18 98.6 F (37 C) (Oral)  08/19/18 98.1 F (36.7 C) (Oral)   BP Readings from Last 3 Encounters:  05/15/19 (!) 158/58  05/03/19 (!) 150/76  02/15/19 (!) 162/68   Pulse Readings from Last 3 Encounters:  05/15/19 70  05/03/19 80  02/15/19 (!) 58    150/70, pulse 60, resp 16    ASSESSMENT & PLAN:    Atherosclerosis of native coronary artery of native heart with stable angina pectoris ([place]) Recommend he try to restart aspirin and Crestor, discussed with the son  PAD (peripheral artery disease) (HCC) Aspirin Crestor as above Goal LDL less than 70  Carotid artery disease, unspecified laterality, unspecified type (HCC) Denies having anginal symptoms  Hyperlipidemia LDL goal <70 Discussed with the son, will retry Crestor  Essential hypertension Son would like to consolidate his medications given noncompliance and cognitive issues We will stop all of his blood pressure medications and start amlodipine/olmesartan 10/40 once a day Son will check his blood pressure and call us with numbers If blood pressure runs high we will add low-dose Cardura possibly 1 mg with slow titration upwards  Syncope, unspecified syncope type Denies any recent episodes of near syncope or syncope  CKD (chronic kidney disease), stage IV (HCC) Creatinine  greater than 3, has follow-up with nephrology per the son   COVID-19 Education: The signs and symptoms of COVID-19 were discussed with the patient and how to seek care for testing (follow up with PCP or arrange E-visit).  The importance of social distancing was discussed today.  Patient Risk:   After full review of this patients  clinical status, I feel that they are at least moderate risk at this time.  Time:   Today, I have spent 25 minutes with the patient with telehealth technology discussing the cardiac and medical problems/diagnoses detailed above   10 min spent reviewing the chart prior to patient visit today   Medication Adjustments/Labs and Tests Ordered: Current medicines are reviewed at length with the patient today.  Concerns regarding medicines are outlined above.   Tests Ordered: No tests ordered   Medication Changes: No changes made   Disposition: Follow-up in 6 months   Signed, Ida Rogue, MD  07/09/2019 7:09 PM    [place] Office [address], [place], [place] [address]

## 2019-07-10 ENCOUNTER — Other Ambulatory Visit: Payer: Self-pay

## 2019-07-10 ENCOUNTER — Encounter: Payer: Self-pay | Admitting: Cardiovascular Disease

## 2019-07-10 ENCOUNTER — Telehealth (INDEPENDENT_AMBULATORY_CARE_PROVIDER_SITE_OTHER): Payer: Medicare Other | Admitting: Cardiovascular Disease

## 2019-07-10 VITALS — BP 156/140 | Ht 66.0 in | Wt 146.0 lb

## 2019-07-10 DIAGNOSIS — I6521 Occlusion and stenosis of right carotid artery: Secondary | ICD-10-CM

## 2019-07-10 DIAGNOSIS — I25118 Atherosclerotic heart disease of native coronary artery with other forms of angina pectoris: Secondary | ICD-10-CM | POA: Diagnosis not present

## 2019-07-10 DIAGNOSIS — N184 Chronic kidney disease, stage 4 (severe): Secondary | ICD-10-CM | POA: Diagnosis not present

## 2019-07-10 DIAGNOSIS — I10 Essential (primary) hypertension: Secondary | ICD-10-CM

## 2019-07-10 DIAGNOSIS — N186 End stage renal disease: Secondary | ICD-10-CM

## 2019-07-10 DIAGNOSIS — E785 Hyperlipidemia, unspecified: Secondary | ICD-10-CM

## 2019-07-10 DIAGNOSIS — G3184 Mild cognitive impairment, so stated: Secondary | ICD-10-CM

## 2019-07-10 DIAGNOSIS — I779 Disorder of arteries and arterioles, unspecified: Secondary | ICD-10-CM

## 2019-07-10 DIAGNOSIS — I1310 Hypertensive heart and chronic kidney disease without heart failure, with stage 1 through stage 4 chronic kidney disease, or unspecified chronic kidney disease: Secondary | ICD-10-CM | POA: Diagnosis not present

## 2019-07-10 DIAGNOSIS — I739 Peripheral vascular disease, unspecified: Secondary | ICD-10-CM

## 2019-07-10 DIAGNOSIS — E782 Mixed hyperlipidemia: Secondary | ICD-10-CM

## 2019-07-10 MED ORDER — ROSUVASTATIN CALCIUM 20 MG PO TABS: 20.0000 mg | ORAL_TABLET | Freq: Every day | ORAL | 3 refills | Status: DC

## 2019-07-10 MED ORDER — ASPIRIN EC 81 MG PO TBEC: 81.0000 mg | DELAYED_RELEASE_TABLET | Freq: Every day | ORAL | 3 refills | Status: AC

## 2019-07-10 MED ORDER — AMLODIPINE-OLMESARTAN 10-40 MG PO TABS: 1.0000 | ORAL_TABLET | Freq: Every day | ORAL | 3 refills | Status: AC

## 2019-07-10 NOTE — Patient Instructions (Addendum)
Medication Instructions:  Your physician has recommended you make the following change in your medication:  1. START Amlodipine/Olmesartan 10/40 mg once daily 2. RESTART Rosuvastatin 20 mg once daily 3. RESTART Aspirin 81 mg once daily 4. STOP Amlodipine 5. STOP Hyralazine 6. STOP Cardura 7. STOP Irbesartan  Son will help check pressures and call us with numbers in the next week or so   If you need a refill on your cardiac medications before your next appointment, please call your pharmacy.    Lab work: No new labs needed   If you have labs (blood work) drawn today and your tests are completely normal, you will receive your results only by: [name] MyChart Message (if you have MyChart) OR . A paper copy in the mail If you have any lab test that is abnormal or we need to change your treatment, we will call you to review the results.   Testing/Procedures: No new testing needed   Follow-Up: At [facility], you and your health needs are our priority.  As part of our continuing mission to provide you with exceptional heart care, we have created designated Provider Care Teams.  These Care Teams include your primary Cardiologist (physician) and Advanced Practice Providers (APPs -  Physician Assistants and Nurse Practitioners) who all work together to provide you with the care you need, when you need it.  . You will need a follow up appointment in 6 months  . Providers on your designated Care Team:   . Murray Hodgkins, NP . Christell Faith, PA-C . Marrianne Mood, PA-C  Any Other Special Instructions Will Be Listed Below (If Applicable).  For educational health videos Log in to : [email or web address] Or : [email or web address], password : [email or web address]   How to Take Your Blood Pressure You can take your blood pressure at home with a machine. You may need to check your blood pressure at home:  To check if you have high blood pressure (hypertension).  To check your blood pressure over time.  To  make sure your blood pressure medicine is working. Supplies needed: You will need a blood pressure machine, or monitor. You can buy one at a drugstore or online. When choosing one:  Choose one with an arm cuff.  Choose one that wraps around your upper arm. Only one finger should fit between your arm and the cuff.  Do not choose one that measures your blood pressure from your wrist or finger. Your doctor can suggest a monitor. How to prepare Avoid these things for 30 minutes before checking your blood pressure:  Drinking caffeine.  Drinking alcohol.  Eating.  Smoking.  Exercising. Five minutes before checking your blood pressure:  Pee.  Sit in a dining chair. Avoid sitting in a soft couch or armchair.  Be quiet. Do not talk. How to take your blood pressure Follow the instructions that came with your machine. If you have a digital blood pressure monitor, these may be the instructions: 1. Sit up straight. 2. Place your feet on the floor. Do not cross your ankles or legs. 3. Rest your left arm at the level of your heart. You may rest it on a table, desk, or chair. 4. Pull up your shirt sleeve. 5. Wrap the blood pressure cuff around the upper part of your left arm. The cuff should be 1 inch (2.5 cm) above your elbow. It is best to wrap the cuff around bare skin. 6. Fit the cuff snugly around your arm. You should be able to  place only one finger between the cuff and your arm. 7. Put the cord inside the groove of your elbow. 8. Press the power button. 9. Sit quietly while the cuff fills with air and loses air. 10. Write down the numbers on the screen. 11. Wait 2-3 minutes and then repeat steps 1-10. What do the numbers mean? Two numbers make up your blood pressure. The first number is called systolic pressure. The second is called diastolic pressure. An example of a blood pressure reading is "120 over 80" (or 120/80). If you are an adult and do not have a medical condition, use  this guide to find out if your blood pressure is normal: Normal  First number: below 120.  Second number: below 80. Elevated  First number: 120-129.  Second number: below 80. Hypertension stage 1  First number: 130-139.  Second number: 80-89. Hypertension stage 2  First number: 140 or above.  Second number: 81 or above. Your blood pressure is above normal even if only the top or bottom number is above normal. Follow these instructions at home:  Check your blood pressure as often as your doctor tells you to.  Take your monitor to your next doctor's appointment. Your doctor will: ? Make sure you are using it correctly. ? Make sure it is working right.  Make sure you understand what your blood pressure numbers should be.  Tell your doctor if your medicines are causing side effects. Contact a doctor if:  Your blood pressure keeps being high. Get help right away if:  Your first blood pressure number is higher than 180.  Your second blood pressure number is higher than 120. This information is not intended to replace advice given to you by your health care provider. Make sure you discuss any questions you have with your health care provider. Document Released: 11/17/2008 Document Revised: 11/17/2017 Document Reviewed: 05/13/2016 Elsevier Patient Education  2020 [employer].  Blood Pressure Record Sheet To take your blood pressure, you will need a blood pressure machine. You can buy a blood pressure machine (blood pressure monitor) at your clinic, drug store, or online. When choosing one, consider:  An automatic monitor that has an arm cuff.  A cuff that wraps snugly around your upper arm. You should be able to fit only one finger between your arm and the cuff.  A device that stores blood pressure reading results.  Do not choose a monitor that measures your blood pressure from your wrist or finger. Follow your health care provider's instructions for how to take your  blood pressure. To use this form:  Get one reading in the morning (a.m.) before you take any medicines.  Get one reading in the evening (p.m.) before supper.  Take at least 2 readings with each blood pressure check. This makes sure the results are correct. Wait 1-2 minutes between measurements.  Write down the results in the spaces on this form.  Repeat this once a week, or as told by your health care provider.  Make a follow-up appointment with your health care provider to discuss the results. Blood pressure log Date: _______________________  a.m. _____________________(1st reading) _____________________(2nd reading)  p.m. _____________________(1st reading) _____________________(2nd reading) Date: _______________________  a.m. _____________________(1st reading) _____________________(2nd reading)  p.m. _____________________(1st reading) _____________________(2nd reading) Date: _______________________  a.m. _____________________(1st reading) _____________________(2nd reading)  p.m. _____________________(1st reading) _____________________(2nd reading) Date: _______________________  a.m. _____________________(1st reading) _____________________(2nd reading)  p.m. _____________________(1st reading) _____________________(2nd reading) Date: _______________________  a.m. _____________________(1st reading) _____________________(2nd reading)  p.m. _____________________(1st  reading) _____________________(2nd reading) This information is not intended to replace advice given to you by your health care provider. Make sure you discuss any questions you have with your health care provider. Document Released: 09/03/2003 Document Revised: 02/02/2018 Document Reviewed: 12/05/2017 Elsevier Patient Education  2020 [employer].

## 2019-08-05 ENCOUNTER — Other Ambulatory Visit: Payer: Self-pay | Admitting: Nurse Practitioner

## 2019-08-05 ENCOUNTER — Other Ambulatory Visit: Payer: Self-pay

## 2019-08-05 ENCOUNTER — Emergency Department: Payer: Medicare Other

## 2019-08-05 ENCOUNTER — Emergency Department
Admission: EM | Admit: 2019-08-05 | Discharge: 2019-08-05 | Disposition: A | Discharge status: Home / Self Care | Payer: Medicare Other | Attending: Emergency Medicine | Admitting: Emergency Medicine

## 2019-08-05 DIAGNOSIS — Z7982 Long term (current) use of aspirin: Secondary | ICD-10-CM | POA: Insufficient documentation

## 2019-08-05 DIAGNOSIS — R4182 Altered mental status, unspecified: Secondary | ICD-10-CM | POA: Diagnosis present

## 2019-08-05 DIAGNOSIS — I129 Hypertensive chronic kidney disease with stage 1 through stage 4 chronic kidney disease, or unspecified chronic kidney disease: Secondary | ICD-10-CM | POA: Diagnosis not present

## 2019-08-05 DIAGNOSIS — Z951 Presence of aortocoronary bypass graft: Secondary | ICD-10-CM | POA: Diagnosis not present

## 2019-08-05 DIAGNOSIS — Z8546 Personal history of malignant neoplasm of prostate: Secondary | ICD-10-CM | POA: Diagnosis not present

## 2019-08-05 DIAGNOSIS — R410 Disorientation, unspecified: Secondary | ICD-10-CM | POA: Insufficient documentation

## 2019-08-05 DIAGNOSIS — N184 Chronic kidney disease, stage 4 (severe): Secondary | ICD-10-CM | POA: Diagnosis not present

## 2019-08-05 DIAGNOSIS — I2510 Atherosclerotic heart disease of native coronary artery without angina pectoris: Secondary | ICD-10-CM | POA: Diagnosis not present

## 2019-08-05 DIAGNOSIS — Z79899 Other long term (current) drug therapy: Secondary | ICD-10-CM | POA: Diagnosis not present

## 2019-08-05 LAB — CBC WITH DIFFERENTIAL/PLATELET
Abs Immature Granulocytes: 0.05 10*3/uL (ref 0.00–0.07)
Basophils Absolute: 0.1 10*3/uL (ref 0.0–0.1)
Basophils Relative: 1 %
Eosinophils Absolute: 0.4 10*3/uL (ref 0.0–0.5)
Eosinophils Relative: 5 %
HCT: 25.3 % — ABNORMAL LOW (ref 39.0–52.0)
Hemoglobin: 7.8 g/dL — ABNORMAL LOW (ref 13.0–17.0)
Immature Granulocytes: 1 %
Lymphocytes Relative: 11 %
Lymphs Abs: 0.9 10*3/uL (ref 0.7–4.0)
MCH: 19.8 pg — ABNORMAL LOW (ref 26.0–34.0)
MCHC: 30.8 g/dL (ref 30.0–36.0)
MCV: 64.2 fL — ABNORMAL LOW (ref 80.0–100.0)
Monocytes Absolute: 0.6 10*3/uL (ref 0.1–1.0)
Monocytes Relative: 8 %
Neutro Abs: 5.8 10*3/uL (ref 1.7–7.7)
Neutrophils Relative %: 74 %
Platelets: 255 10*3/uL (ref 150–400)
RBC: 3.94 MIL/uL — ABNORMAL LOW (ref 4.22–5.81)
RDW: 17.2 % — ABNORMAL HIGH (ref 11.5–15.5)
Smear Review: NORMAL
WBC: 7.7 10*3/uL (ref 4.0–10.5)
nRBC: 0 % (ref 0.0–0.2)

## 2019-08-05 LAB — URINALYSIS, COMPLETE (UACMP) WITH MICROSCOPIC
Bacteria, UA: NONE SEEN
Bilirubin Urine: NEGATIVE
Glucose, UA: NEGATIVE mg/dL
Hgb urine dipstick: NEGATIVE
Ketones, ur: NEGATIVE mg/dL
Leukocytes,Ua: NEGATIVE
Nitrite: NEGATIVE
Protein, ur: 100 mg/dL — AB
Specific Gravity, Urine: 1.011 (ref 1.005–1.030)
Squamous Epithelial / HPF: NONE SEEN (ref 0–5)
pH: 6 (ref 5.0–8.0)

## 2019-08-05 LAB — COMPREHENSIVE METABOLIC PANEL
ALT: 11 U/L (ref 0–44)
AST: 19 U/L (ref 15–41)
Albumin: 3.9 g/dL (ref 3.5–5.0)
Alkaline Phosphatase: 65 U/L (ref 38–126)
Anion gap: 10 (ref 5–15)
BUN: 80 mg/dL — ABNORMAL HIGH (ref 8–23)
CO2: 16 mmol/L — ABNORMAL LOW (ref 22–32)
Calcium: 8.6 mg/dL — ABNORMAL LOW (ref 8.9–10.3)
Chloride: 109 mmol/L (ref 98–111)
Creatinine, Ser: 3.94 mg/dL — ABNORMAL HIGH (ref 0.61–1.24)
GFR calc Af Amer: 15 mL/min — ABNORMAL LOW (ref >60)
GFR calc non Af Amer: 13 mL/min — ABNORMAL LOW (ref >60)
Glucose, Bld: 141 mg/dL — ABNORMAL HIGH (ref 70–99)
Potassium: 5 mmol/L (ref 3.5–5.1)
Sodium: 135 mmol/L (ref 135–145)
Total Bilirubin: 0.6 mg/dL (ref 0.3–1.2)
Total Protein: 6.7 g/dL (ref 6.5–8.1)

## 2019-08-05 MED ORDER — LIDOCAINE HCL URETHRAL/MUCOSAL 2 % EX GEL: 1.0000 "application " | Freq: Once | CUTANEOUS | Status: DC

## 2019-08-05 NOTE — ED Notes (Signed)
Per pt son pt has been more confused over the weekend and unable to complete ADL.  Son gives example of pt urinating in sink or having difficulty understanding putting leg through pants when trying to get dressed. Wants to make sure this is not a UTI before he moves pt to [facility] with him for more of a dementia state.

## 2019-08-05 NOTE — ED Notes (Signed)
Pt given more water

## 2019-08-05 NOTE — ED Notes (Signed)
Son remains to ask about timing and when the discharge will be ready.  Informed him that the MD was in a code and has been delayed.

## 2019-08-05 NOTE — ED Provider Notes (Signed)
[facility] Emergency Department Provider Note  ____________________________________________  Time seen: Approximately 10:24 AM  I have reviewed the triage vital signs and the nursing notes.   HISTORY  Chief Complaint Altered Mental Status    Level 5 Caveat: Portions of the History and Physical including HPI and review of systems are unable to be completely obtained due to patient being a poor historian   HPI Jake Garcia is a 85 y.o. male with a history of CKD stage V, diastolic heart failure, hypertension, prostate cancer  who was brought to the ED due to worsening confusion, unsteadiness on his feet for the past 3 days.  Waxing and waning without aggravating relieving factors breathing seems to be eating and drinking normally.  Decreased urine output over the last 24 hours.  No vomiting diarrhea fevers chills or pain complaints.  No recent falls although patient son does note that he fell about 2 months ago hitting his face.  Son notes that but due to the patient's age and chronic comorbidities, they are not interested in aggressive management of any serious illnesses and therefore would prefer a limited scope of work-up.   Past Medical History:  Diagnosis Date  . Acidosis   . Carotid artery stenosis   . Chronic kidney disease   . CKD (chronic kidney disease), stage IV ([place])   . Coronary artery disease    a. 11/2008 Cath: severe prox LAD dzs, otw nonobs dzs. LAD not felt to be amenable to PCI; b. 11/2008 CABG x 1: LIMA->LAD.  [name] Diastolic dysfunction    a. 09/2015 Echo: EF 65-70%, no rwma, Gr1 DD. Mod dil LA. PASP 93mmHg.  [name] Hyperlipidemia   . Hyperparathyroidism ([place])   . Hypertension   . Prostate cancer ([place])   . Proteinuria   . Right carotid bruit    a. 09/2015 Carotid U/S: <39% bilat ICA stenosis.  . Stage I Colon cancer ([place])    a. 2008 s/p L hemicolectomy ([facility]). 18 neg nodes.   . Syncope 08/2018     Patient Active Problem List    Diagnosis Date Noted  . ESRD (end stage renal disease) ([place]) 05/15/2019  . Microcytic hypochromic anemia 10/31/2018  . Mild cognitive impairment with memory loss 08/18/2017  . Grief 08/18/2017  . Prostate cancer metastatic to multiple sites ([place]) 06/28/2016  . Elevated PSA 05/23/2016  . Prostate nodule 04/29/2016  . 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 Nocturia 04/29/2016  . Cerebral thrombosis with cerebral infarction 10/12/2015  . Central cord syndrome ([place]) 10/11/2015  . Deflected nasal septum 11/22/2013  . Bulbous nose 11/22/2013  . Carotid stenosis 06/25/2013  . Carotid artery narrowing 06/25/2013  . CAD (coronary artery disease) 08/05/2011  . Hyperlipidemia 08/05/2011  . HTN (hypertension) 08/05/2011  . Arteriosclerosis of coronary artery 08/05/2011     Past Surgical History:  Procedure Laterality Date  . CARDIAC CATHETERIZATION  11/20/2008  . CATARACT EXTRACTION W/PHACO Right 05/10/2016   Procedure: CATARACT EXTRACTION PHACO AND INTRAOCULAR LENS PLACEMENT (IOC);  Surgeon: Birder Robson, MD;  Location: [facility] ORS;  Service: Ophthalmology;  Laterality: Right;  [place] 00:56 AP% 23.7 CDE 13.29 fluid pack lot # [id number]  . CATARACT EXTRACTION W/PHACO Left 01/10/2017   Procedure: CATARACT EXTRACTION PHACO AND INTRAOCULAR LENS PLACEMENT (IOC);  Surgeon: Birder Robson, MD;  Location: [facility] ORS;  Service: Ophthalmology;  Laterality: Left;  [place] 1:01 AP% 17.5 CDE 10.72 Fluid pack lot # [id number]  . COLON SURGERY  2008  . COLONOSCOPY    .  CORONARY ARTERY BYPASS GRAFT  11/24/2008  . Right Partial Nephrectomy  2008   a. Performed due to benign lesion.     Prior to Admission medications   Medication Sig Start Date End Date Taking? Authorizing Provider  amLODipine-olmesartan (AZOR) 10-40 MG tablet Take 1 tablet by mouth daily. 07/10/19   Minna Merritts, MD  aspirin EC 81 MG tablet Take 1 tablet (81 mg total) by mouth daily. 07/10/19   Minna Merritts, MD   ferrous sulfate 325 (65 FE) MG tablet Take 1 tablet (325 mg total) by mouth daily. 12/03/18   Minna Merritts, MD  rosuvastatin (CRESTOR) 20 MG tablet Take 1 tablet (20 mg total) by mouth daily. 07/10/19 10/08/19  Minna Merritts, MD     Allergies Patient has no known allergies.   Family History  Problem Relation Age of Onset  . Prostate cancer Neg Hx   . Kidney cancer Neg Hx   . Bladder Cancer Neg Hx     Social History Social History   Tobacco Use  . Smoking status: Never Smoker  . Smokeless tobacco: Never Used  Substance Use Topics  . Alcohol use: No  . Drug use: No    Review of Systems Level 5 Caveat: Portions of the History and Physical including HPI and review of systems are unable to be completely obtained due to patient being a poor historian   Constitutional:   No known fever.  ENT:   No rhinorrhea. Cardiovascular:   No chest pain or syncope. Respiratory:   No dyspnea or cough. Gastrointestinal:   Negative for abdominal pain, vomiting and diarrhea.  Musculoskeletal:   Negative for focal pain or swelling ____________________________________________   PHYSICAL EXAM:  VITAL SIGNS: ED Triage Vitals  Enc Vitals Group     BP 08/05/19 0946 (!) 164/68     Pulse Rate 08/05/19 0945 70     Resp 08/05/19 0945 18     Temp 08/05/19 0945 97.9 F (36.6 C)     Temp Source 08/05/19 0945 Oral     SpO2 08/05/19 0945 100 %     Weight 08/05/19 0945 150 lb (68 kg)     Height 08/05/19 0945 5\' 7"  (1.702 m)     Head Circumference --      Peak Flow --      Pain Score 08/05/19 0945 0     Pain Loc --      Pain Edu? --      Excl. in [place]? --     Vital signs reviewed, nursing assessments reviewed.   Constitutional:   Alert and oriented to person and place. Non-toxic appearance. Eyes:   Conjunctivae are normal. EOMI. PERRL. ENT      Head:   Normocephalic and atraumatic.      Nose:   No congestion/rhinnorhea.       Mouth/Throat:   Dry mucous membranes, no pharyngeal  erythema. No peritonsillar mass.       Neck:   No meningismus. Full ROM. Hematological/Lymphatic/Immunilogical:   No cervical lymphadenopathy. Cardiovascular:   RRR. Symmetric bilateral radial and DP pulses.  No murmurs. Cap refill less than 2 seconds. Respiratory:   Normal respiratory effort without tachypnea/retractions. Breath sounds are clear and equal bilaterally. No wheezes/rales/rhonchi. Gastrointestinal:   Soft and nontender. Non distended. There is no CVA tenderness.  No rebound, rigidity, or guarding. Genitourinary:   Normal Musculoskeletal:   Normal range of motion in all extremities. No joint effusions.  No lower extremity tenderness.  No edema. Neurologic:   Normal speech and language.  Trouble with memory recall and orientation questions. Motor grossly intact. No acute focal neurologic deficits are appreciated.  Skin:    Skin is warm, dry and intact. No rash noted.  No petechiae, purpura, or bullae.  ____________________________________________    LABS (pertinent positives/negatives) (all labs ordered are listed, but only abnormal results are displayed) Labs Reviewed  COMPREHENSIVE METABOLIC PANEL - Abnormal; Notable for the following components:      Result Value   CO2 16 (*)    Glucose, Bld 141 (*)    BUN 80 (*)    Creatinine, Ser 3.94 (*)    Calcium 8.6 (*)    GFR calc non Af Amer 13 (*)    GFR calc Af Amer 15 (*)    All other components within normal limits  CBC WITH DIFFERENTIAL/PLATELET - Abnormal; Notable for the following components:   RBC 3.94 (*)    Hemoglobin 7.8 (*)    HCT 25.3 (*)    MCV 64.2 (*)    MCH 19.8 (*)    RDW 17.2 (*)    All other components within normal limits  URINALYSIS, COMPLETE (UACMP) WITH MICROSCOPIC - Abnormal; Notable for the following components:   Color, Urine YELLOW (*)    APPearance CLEAR (*)    Protein, ur 100 (*)    All other components within normal limits  URINE CULTURE    ____________________________________________   EKG  Refused by son on behalf of patient  ____________________________________________    RADIOLOGY  No results found.  ____________________________________________   PROCEDURES Procedures  ____________________________________________  DIFFERENTIAL DIAGNOSIS   Dehydration, electrolyte abnormality, renal failure/uremia, ACS, intracranial hemorrhage, stroke, brain tumor/edema/metastasis  CLINICAL IMPRESSION / ASSESSMENT AND PLAN / ED COURSE  Pertinent labs & imaging results that were available during my care of the patient were reviewed by me and considered in my medical decision making (see chart for details).   ASHWATH LASCH was evaluated in Emergency Department on 08/05/2019 for the symptoms described in the history of present illness. He was evaluated in the context of the global COVID-19 pandemic, which necessitated consideration that the patient might be at risk for infection with the SARS-CoV-2 virus that causes COVID-19. Institutional protocols and algorithms that pertain to the evaluation of patients at risk for COVID-19 are in a state of rapid change based on information released by regulatory bodies including the CDC and federal and state organizations. These policies and algorithms were followed during the patient's care in the ED.   Patient presents with waxing and waning mental status, acute on chronic confusion.  Nontoxic, uncomfortable at present.  Son states that he is currently more alert and normal been he was even an hour ago.  Son refuses in and out cath and prefers to wait for a spontaneous void.  Refuses CT scan of the head and EKG, noting that if such a serious condition were present they would not do anything to manage it anyway.  The son is very sophisticated and has a background in healthcare himself and able to make informed decisions on behalf of his father.  Will check serum and urine labs.  Patient is  drinking 2 cups of water currently.  Clinical Course as of Aug 04 1230  Mon Aug 05, 2019  1229 Labs all baseline. Stable for DC home   [PS]    Clinical Course User Index [PS] Carrie Mew, MD     ____________________________________________   FINAL CLINICAL  IMPRESSION(S) / ED DIAGNOSES    Final diagnoses:  Confusion     ED Discharge Orders    None      Portions of this note were generated with dragon dictation software. Dictation errors may occur despite best attempts at proofreading.   Carrie Mew, MD 08/05/19 1231

## 2019-08-05 NOTE — ED Notes (Signed)
Pt remains in bed. Waiting on urine specimen.

## 2019-08-05 NOTE — ED Triage Notes (Signed)
Pt is here with his son with c/o increased confusion with decreased urinary output. Pt lives alone. Son is concerned about his state and living alone

## 2019-08-05 NOTE — Telephone Encounter (Signed)
We treat dementia, but we would need to have an office visit to evaluate current status.  Prefer in person evaluation if possible.

## 2019-08-05 NOTE — ED Notes (Signed)
Son and pt in hall and report they are leaving because papers not done.  Informed him that MD was in code and has not been able to complete papers.  He asked why another physician cannot do the papers and explained there has been 3 emergency traffics at the same time and all MDs are busy with those at this time.  At time they are walking out dr Joni Fears brought discharge papers and went over then with pt.  Unable to have them sign anything.

## 2019-08-05 NOTE — ED Notes (Signed)
Son keeps wandering halls and coming to nursing desk to check on status.  Updated as best as possible.

## 2019-08-05 NOTE — Telephone Encounter (Signed)
Son called wanted to know if you would prescribe some  Dementia  Medication to pt. Son  Call back # is  [phone]

## 2019-08-05 NOTE — ED Notes (Signed)
Son remains at bedside

## 2019-08-05 NOTE — Discharge Instructions (Signed)
Your lab tests were all okay today, consistent with your chronic kidney disease and anemia.  There are no signs of infection on the urine analysis.   Results for orders placed or performed during the hospital encounter of 08/05/19  Comprehensive metabolic panel  Result Value Ref Range   Sodium 135 135 - 145 mmol/L   Potassium 5.0 3.5 - 5.1 mmol/L   Chloride 109 98 - 111 mmol/L   CO2 16 (L) 22 - 32 mmol/L   Glucose, Bld 141 (H) 70 - 99 mg/dL   BUN 80 (H) 8 - 23 mg/dL   Creatinine, Ser 3.94 (H) 0.61 - 1.24 mg/dL   Calcium 8.6 (L) 8.9 - 10.3 mg/dL   Total Protein 6.7 6.5 - 8.1 g/dL   Albumin 3.9 3.5 - 5.0 g/dL   AST 19 15 - 41 U/L   ALT 11 0 - 44 U/L   Alkaline Phosphatase 65 38 - 126 U/L   Total Bilirubin 0.6 0.3 - 1.2 mg/dL   GFR calc non Af Amer 13 (L) >60 mL/min   GFR calc Af Amer 15 (L) >60 mL/min   Anion gap 10 5 - 15  CBC with Differential  Result Value Ref Range   WBC 7.7 4.0 - 10.5 K/uL   RBC 3.94 (L) 4.22 - 5.81 MIL/uL   Hemoglobin 7.8 (L) 13.0 - 17.0 g/dL   HCT 25.3 (L) 39.0 - 52.0 %   MCV 64.2 (L) 80.0 - 100.0 fL   MCH 19.8 (L) 26.0 - 34.0 pg   MCHC 30.8 30.0 - 36.0 g/dL   RDW 17.2 (H) 11.5 - 15.5 %   Platelets 255 150 - 400 K/uL   nRBC 0.0 0.0 - 0.2 %   Neutrophils Relative % 74 %   Neutro Abs 5.8 1.7 - 7.7 K/uL   Lymphocytes Relative 11 %   Lymphs Abs 0.9 0.7 - 4.0 K/uL   Monocytes Relative 8 %   Monocytes Absolute 0.6 0.1 - 1.0 K/uL   Eosinophils Relative 5 %   Eosinophils Absolute 0.4 0.0 - 0.5 K/uL   Basophils Relative 1 %   Basophils Absolute 0.1 0.0 - 0.1 K/uL   WBC Morphology MORPHOLOGY UNREMARKABLE    RBC Morphology MIXED RBC POPULATION    Smear Review Normal platelet morphology    Immature Granulocytes 1 %   Abs Immature Granulocytes 0.05 0.00 - 0.07 K/uL   Tear Drop Cells PRESENT   Urinalysis, Complete w Microscopic  Result Value Ref Range   Color, Urine YELLOW (A) YELLOW   APPearance CLEAR (A) CLEAR   Specific Gravity, Urine 1.011 1.005 -  1.030   pH 6.0 5.0 - 8.0   Glucose, UA NEGATIVE NEGATIVE mg/dL   Hgb urine dipstick NEGATIVE NEGATIVE   Bilirubin Urine NEGATIVE NEGATIVE   Ketones, ur NEGATIVE NEGATIVE mg/dL   Protein, ur 100 (A) NEGATIVE mg/dL   Nitrite NEGATIVE NEGATIVE   Leukocytes,Ua NEGATIVE NEGATIVE   RBC / HPF 6-10 0 - 5 RBC/hpf   WBC, UA 0-5 0 - 5 WBC/hpf   Bacteria, UA NONE SEEN NONE SEEN   Squamous Epithelial / LPF NONE SEEN 0 - 5   Mucus PRESENT    Hyaline Casts, UA PRESENT    No results found.

## 2019-08-05 NOTE — ED Notes (Signed)
RN at bedside to perform EKG. Informed son/pt of CT scan.  Son does not want CT or EKG done. Dr Joni Fears notified.  Pt given cup water to encourage urine specimen.

## 2019-08-05 NOTE — ED Notes (Addendum)
Attempted in and out cath.  Bladder scan volume 101 ML.  Pt not tolerating in and out cath, son would like to stop and let patient attempt to urinate on own.  Stopped at this time.  No resistance was met.  Only got a little over half of catheter in.  No resistance was met at any point. Would stop pushing as patient pulled knees up.  Dr Joni Fears aware.

## 2019-08-06 LAB — URINE CULTURE: Culture: NO GROWTH

## 2019-08-07 ENCOUNTER — Encounter: Payer: Self-pay | Admitting: Nurse Practitioner

## 2019-08-07 ENCOUNTER — Ambulatory Visit (INDEPENDENT_AMBULATORY_CARE_PROVIDER_SITE_OTHER): Payer: Medicare Other | Admitting: Nurse Practitioner

## 2019-08-07 ENCOUNTER — Other Ambulatory Visit: Payer: Self-pay

## 2019-08-07 DIAGNOSIS — R413 Other amnesia: Secondary | ICD-10-CM

## 2019-08-07 NOTE — Telephone Encounter (Signed)
The pt son Jake Garcia Radiation Oncologist called with concerns about his father mental status. He would like to get his father started on some medication for memory loss. He's requesting a virtual appt since the pt is currently out of town with him [phone].   I spoke with Lauren and she verbalize it's okay to schedule a virtual appt.

## 2019-08-07 NOTE — Telephone Encounter (Signed)
I spoke with [name] and scheduled the appt for today at 1:20pm.
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
## 2019-08-14 ENCOUNTER — Encounter: Payer: Self-pay | Admitting: Nurse Practitioner

## 2019-08-29 ENCOUNTER — Telehealth (INDEPENDENT_AMBULATORY_CARE_PROVIDER_SITE_OTHER): Payer: Self-pay

## 2019-08-29 NOTE — Telephone Encounter (Signed)
A fax and a call was received from [facility] at [place] regarding the patient having a permcath insertion. I was asked to fax the appt back. Patient is now scheduled with Dr. Delana Meyer for angio on 09/04/2019 with a 10:00 am arrival to the [facility]. Patient will do his Covid testing on 08/30/2019 between 12:30-2:30 pm at the [facility]. Pre-procedure instructions will be faxed to [facility]

## 2019-08-30 ENCOUNTER — Other Ambulatory Visit: Admission: RE | Admit: 2019-08-30 | Payer: Medicare Other | Source: Ambulatory Visit

## 2019-09-02 ENCOUNTER — Other Ambulatory Visit (HOSPITAL_COMMUNITY): Payer: Self-pay | Admitting: Urology

## 2019-09-02 ENCOUNTER — Telehealth: Payer: Self-pay

## 2019-09-02 DIAGNOSIS — Z192 Hormone resistant malignancy status: Secondary | ICD-10-CM

## 2019-09-02 DIAGNOSIS — G3184 Mild cognitive impairment, so stated: Secondary | ICD-10-CM

## 2019-09-02 NOTE — Telephone Encounter (Signed)
I do not know who has ordered the home health care as I did not place this order.   - Patient was moved to [place] with his son in last 2 months. We helped with head imaging and it is possible, patient has seen a neurologist for whom we have no notes. His son then moved to [facility].  Please help confirm patient's relocation to [facility] and needs.  We can assist with orders as needed, but I do not know details about what he needs specifically (aide vs therapies).  I have placed a referral to [facility] for them to help [place] close the loop on this.

## 2019-09-02 NOTE — Telephone Encounter (Signed)
Patient's daughter called to cancel the angio for the patient scheduled for 09/04/2019 with Dr. Delana Meyer. Patient's daughter stated the patient was confused from his brain surgery when the doctor sent the fax to schedule the permcath placement. The patient has been canceled per the daughter who stated the brother wanted her to can and cancel the procedure.

## 2019-09-02 NOTE — Telephone Encounter (Signed)
Jake Garcia called from [employer] in [place] with concerns about getting [place] set up for the patient in [place]. She informed me that the patient was in [facility], [place] in a Inpatient rehab.  The pt requested to leave the facility and relocated back to [place]. The reason for the call is because she has been trying to find Home health skill nursing for the patient in [place] and the request been denied by [employer], [place], and [employer] .

## 2019-09-03 NOTE — Telephone Encounter (Signed)
I called and spoke with the patient daughter [name] who confirmed that the patient is back in [place]. She informed me that the patient had recent Brain Surgery in [place] after a recent diagnoses of brain hemorrhage. After surgery he was sent to [facility]. The pt requested to leave the facility and returned back to [place]. The pt daughter states the patient is doing much better post surgery.  I scheduled a face to face visit for the patient on Thursday.

## 2019-09-04 ENCOUNTER — Encounter: Admission: RE | Payer: Self-pay | Source: Home / Self Care

## 2019-09-04 ENCOUNTER — Ambulatory Visit: Admission: RE | Admit: 2019-09-04 | Payer: Medicare Other | Source: Home / Self Care | Admitting: Vascular Surgery

## 2019-09-04 SURGERY — DIALYSIS/PERMA CATHETER INSERTION
Anesthesia: Moderate Sedation

## 2019-09-05 ENCOUNTER — Telehealth: Payer: Self-pay

## 2019-09-05 ENCOUNTER — Encounter: Payer: Self-pay | Admitting: Nurse Practitioner

## 2019-09-05 ENCOUNTER — Ambulatory Visit (INDEPENDENT_AMBULATORY_CARE_PROVIDER_SITE_OTHER): Payer: Medicare Other | Admitting: Nurse Practitioner

## 2019-09-05 ENCOUNTER — Other Ambulatory Visit: Payer: Self-pay

## 2019-09-05 VITALS — BP 162/60 | HR 62 | Ht 67.0 in | Wt 145.6 lb

## 2019-09-05 DIAGNOSIS — Z9181 History of falling: Secondary | ICD-10-CM | POA: Diagnosis not present

## 2019-09-05 DIAGNOSIS — D649 Anemia, unspecified: Secondary | ICD-10-CM

## 2019-09-05 DIAGNOSIS — F321 Major depressive disorder, single episode, moderate: Secondary | ICD-10-CM | POA: Diagnosis not present

## 2019-09-05 DIAGNOSIS — N402 Nodular prostate without lower urinary tract symptoms: Secondary | ICD-10-CM

## 2019-09-05 DIAGNOSIS — Z8679 Personal history of other diseases of the circulatory system: Secondary | ICD-10-CM

## 2019-09-05 MED ORDER — ESCITALOPRAM OXALATE 5 MG PO TABS: 5.0000 mg | ORAL_TABLET | Freq: Every day | ORAL | 3 refills | Status: AC
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
## 2019-09-05 NOTE — Patient Instructions (Addendum)
Jake Garcia,   Thank you for coming in to clinic today.  1. START escitalopram 5 mg one tablet daily for depression.  Slow improvement in moods is expected over next 6-8 weeks  2. I need to see discharge summary from [facility] - please leave name of hospital with our front desk. - Neurosurgeon name who did the surgery/office name so we can ask about followup.  3. Labs today.  4. Home health will call to start physical therapy for rehab.  5. T[facility] by [facility] can help you get in home aide if needed. [phone]   Please schedule a follow-up appointment with Cassell Smiles, AGNP. Return in about 2 months (around 11/05/2019) for depression.  If you have any other questions or concerns, please feel free to call the clinic or send a message through [place]. You may also schedule an earlier appointment if necessary.  You will receive a survey after today's visit either digitally by e-mail or paper by [employer]. Your experiences and feedback matter to [place].  Please respond so we know how we are doing as we provide care for you.   Cassell Smiles, DNP, AGNP-BC Adult Gerontology Nurse Practitioner [facility]

## 2019-09-05 NOTE — Telephone Encounter (Signed)
[name] the son of Mr. Hann requested you call him back to discuss his father care. His return phone # [phone].

## 2019-09-05 NOTE — Telephone Encounter (Signed)
Call returned.  See [facility] encounter from 09/05/19 for information.

## 2019-09-06 LAB — B12 AND FOLATE PANEL
Folate: 13.3 ng/mL
Vitamin B-12: 342 pg/mL (ref 200–1100)

## 2019-09-06 LAB — IRON,TIBC AND FERRITIN PANEL
%SAT: 14 % (calc) — ABNORMAL LOW (ref 20–48)
Ferritin: 150 ng/mL (ref 24–380)
Iron: 25 ug/dL — ABNORMAL LOW (ref 50–180)
TIBC: 177 mcg/dL (calc) — ABNORMAL LOW (ref 250–425)

## 2019-09-06 LAB — PSA: PSA: 14.4 ng/mL — ABNORMAL HIGH (ref <=4.0)

## 2019-09-12 ENCOUNTER — Encounter: Payer: Self-pay | Admitting: Nurse Practitioner

## 2019-09-13 ENCOUNTER — Telehealth: Payer: Self-pay

## 2019-09-13 NOTE — Telephone Encounter (Signed)
The verbal order was given.

## 2019-09-13 NOTE — Telephone Encounter (Signed)
Sharee Pimple, Physical Therapist from [place] called requesting a verbal order for Physical Therapy for x 2 for 3 weeks and x1 for 1 week. [phone]

## 2019-09-13 NOTE — Telephone Encounter (Signed)
Please call and provide these verbal orders as specified.  It is appropriate for patient's need for balance/strengthening to prevent falls.

## 2019-09-17 ENCOUNTER — Other Ambulatory Visit: Payer: Self-pay

## 2019-09-17 ENCOUNTER — Encounter (HOSPITAL_COMMUNITY)
Admission: RE | Admit: 2019-09-17 | Discharge: 2019-09-17 | Disposition: A | Discharge status: Home / Self Care | Payer: Medicare Other | Source: Ambulatory Visit | Attending: Urology | Admitting: Urology

## 2019-09-17 DIAGNOSIS — C61 Malignant neoplasm of prostate: Secondary | ICD-10-CM | POA: Insufficient documentation

## 2019-09-17 DIAGNOSIS — Z192 Hormone resistant malignancy status: Secondary | ICD-10-CM | POA: Diagnosis present

## 2019-09-17 MED ORDER — TECHNETIUM TC 99M MEDRONATE IV KIT
21.1000 | PACK | Freq: Once | INTRAVENOUS | Status: AC | PRN
  Administered 2019-09-17: 21.1 via INTRAVENOUS

## 2019-09-23 ENCOUNTER — Other Ambulatory Visit: Payer: Self-pay | Admitting: Nurse Practitioner

## 2019-09-23 DIAGNOSIS — Z8679 Personal history of other diseases of the circulatory system: Secondary | ICD-10-CM

## 2019-09-23 NOTE — Progress Notes (Signed)
Results received on disc from [facility] of original head CT.  Patient needs repeat CT to evaluate for reaccumulation of hematoma.

## 2019-09-27 ENCOUNTER — Other Ambulatory Visit: Payer: Self-pay | Admitting: Urology

## 2019-09-30 ENCOUNTER — Ambulatory Visit (INDEPENDENT_AMBULATORY_CARE_PROVIDER_SITE_OTHER): Payer: Medicare Other | Admitting: *Deleted

## 2019-09-30 DIAGNOSIS — F321 Major depressive disorder, single episode, moderate: Secondary | ICD-10-CM

## 2019-09-30 DIAGNOSIS — I2510 Atherosclerotic heart disease of native coronary artery without angina pectoris: Secondary | ICD-10-CM

## 2019-09-30 DIAGNOSIS — Z9181 History of falling: Secondary | ICD-10-CM

## 2019-09-30 DIAGNOSIS — R413 Other amnesia: Secondary | ICD-10-CM

## 2019-09-30 DIAGNOSIS — G3184 Mild cognitive impairment, so stated: Secondary | ICD-10-CM

## 2019-09-30 NOTE — Patient Instructions (Signed)
Thank you allowing the Chronic Care Management Team to be a part of your care! It was a pleasure speaking with you today!  CCM (Chronic Care Management) Team   Braxdon Gappa RN, BSN Nurse Care Coordinator  [phone]  [name] PharmD  Clinical Pharmacist  [phone]  Eula Fried LCSW Clinical Social Worker [phone]  Goals Addressed            This Visit's Progress   . We are needing more help caring for dad (pt-stated)       This care-plan was developed with the daughter who is the patient's caregiver.  Current Barriers:  . Lacks caregiver support.  . Film/video editor.  . Cognitive Deficits . No Advanced Directives in place . Recent fall with head injury- hemorraghic bleed requiring surgery  .   Nurse Case Manager Clinical Goal(s):  [name] Over the next 90 days, patient will meet with RN Care Manager to address in home care needs  Interventions:  . Provided education to patient and/or caregiver about advanced directives . Advised patient to attend upcoming Urology appointment to ask questions about removing the kidney stone but preserving kidney function.  . Discussed plans with patient for ongoing care management follow up and provided patient with direct contact information for care management team . Provided patient and/or caregiver with vwrbal and written  information about [employer] and Museum/gallery curator (community resource). . Care Guide referral for Patient would benefit from Meals on Wheels . Social Work referral for patient with depression and need to assist with Medicaid application for potential PCS services.  . Pharmacy referral for history of non adherence  Patient Self Care Activities:  . Attends all scheduled provider appointments . Unable to self administer medications as prescribed, Unable to perform ADLs independently, and Unable to perform IADLs independently- Per EMR   Initial goal documentation        The patient  verbalized understanding of instructions provided today and declined a print copy of patient instruction materials.   The patient has been provided with contact information for the care management team and has been advised to call with any health related questions or concerns.
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
## 2019-10-01 ENCOUNTER — Ambulatory Visit: Payer: Medicare Other

## 2019-10-07 ENCOUNTER — Other Ambulatory Visit
Admission: RE | Admit: 2019-10-07 | Discharge: 2019-10-07 | Disposition: A | Discharge status: Home / Self Care | Payer: Medicare Other | Source: Ambulatory Visit | Attending: Urology | Admitting: Urology

## 2019-10-07 ENCOUNTER — Other Ambulatory Visit: Payer: Self-pay

## 2019-10-07 DIAGNOSIS — Z01812 Encounter for preprocedural laboratory examination: Secondary | ICD-10-CM | POA: Insufficient documentation

## 2019-10-07 DIAGNOSIS — Z20828 Contact with and (suspected) exposure to other viral communicable diseases: Secondary | ICD-10-CM | POA: Insufficient documentation

## 2019-10-08 ENCOUNTER — Ambulatory Visit: Payer: Self-pay | Admitting: Pharmacist

## 2019-10-08 DIAGNOSIS — N186 End stage renal disease: Secondary | ICD-10-CM | POA: Diagnosis not present

## 2019-10-08 DIAGNOSIS — E782 Mixed hyperlipidemia: Secondary | ICD-10-CM | POA: Diagnosis not present

## 2019-10-08 DIAGNOSIS — I10 Essential (primary) hypertension: Secondary | ICD-10-CM | POA: Diagnosis not present

## 2019-10-08 LAB — SARS CORONAVIRUS 2 (TAT 6-24 HRS): SARS Coronavirus 2: NEGATIVE

## 2019-10-08 NOTE — Chronic Care Management (AMB) (Addendum)
Chronic Care Management   Follow Up Note   10/08/2019 Name: Jake Garcia MRN: [id number] DOB: 14-Jul-1929  Referred by: Jake College, NP Reason for referral : No chief complaint on file.   NYKO Jake Garcia is a 85 y.o. year old male who is a primary care patient of Jake College, NP. The CCM team was consulted for assistance with chronic disease management and care coordination needs.  Jake Garcia has a past medical history including but not limited to recent subarachnoid hemorrhage (07/2019), CKD- Stage V, atherosclerosis, peripheral artery disease, carotid artery disease, hypertension, depression, prostate cancer and kidney stone.  Receive referral from CM Nurse Case Manager to outreach to daughter/caregiver for medication review and evaluation of medication adherence.  Outreach call to Garment/textile technologist, [name]. Note daughter listed on patient's designated party release in chart. Jake Garcia reports that her brother, Jake Garcia, currently manages Jake Garcia's medications and administers them to patient each morning. States that she will provide my number to the patient and Jake Garcia.  Receive incoming call from Jake Garcia by phone today. Patient gives permission for me to speak with his son, Jake Garcia, regarding his medications.  Review of patient status, including review of consultants reports, relevant laboratory and other test results, and collaboration with appropriate care team members and the patient's provider was performed as part of comprehensive patient evaluation and provision of chronic care management services.     Outpatient Encounter Medications as of 10/08/2019  Medication Sig  . amLODipine-olmesartan (AZOR) 10-40 MG tablet Take 1 tablet by mouth daily.  Jake Garcia escitalopram (LEXAPRO) 5 MG tablet Take 1 tablet (5 mg total) by mouth daily.  . multivitamin-lutein (OCUVITE-LUTEIN) CAPS capsule Take 1 capsule by mouth daily.  . rosuvastatin (CRESTOR) 20 MG tablet Take 1  tablet (20 mg total) by mouth daily.  Jake Garcia senna (SENOKOT) 8.6 MG TABS tablet Take 1 tablet by mouth daily as needed for mild constipation.  . tamsulosin (FLOMAX) 0.4 MG CAPS capsule Take 0.4 mg by mouth daily.  Jake Garcia aspirin EC 81 MG tablet Take 1 tablet (81 mg total) by mouth daily. (Patient not taking: Reported on 10/08/2019)  . ferrous sulfate 325 (65 FE) MG tablet Take 1 tablet (325 mg total) by mouth daily. (Patient not taking: Reported on 10/08/2019)  . [DISCONTINUED] CARDURA XL 4 MG 24 hr tablet    No facility-administered encounter medications on file as of 10/08/2019.     Goals Addressed            This Visit's Progress   . PharmD - Medication Review       Current Barriers:  . Non Adherence to prescribed medication regimen . Cognitive deficits  Pharmacist Clinical Goal(s):  Jake Garcia Over the next 30 days, patient will work with CM Pharmacist to address needs related to medication review and medication adherence  Interventions: . Comprehensive medication review performed with patient and son, Jake Garcia; medication list updated in electronic medical record o Reports patient not currently taking aspirin 81 mg - Per 9/17 PCP note, patient admitted in [name], [name] on 8/21 for brain hemorrhage. Patient had emergency surgery. . Patient to have follow-up head CT scan for resolution of hemorrhage/follow up with Neurology - Patient with history of atherosclerosis - Advise patient/son to follow up with providers (Cardiology/Neurology) about whether to restart aspirin  o Reports patient had temporarily stopped taking ferrous sulfate due to constipation. - Reports constipation now controlled with use of Senna (1 tablet daily as needed) and planning to restart  ferrous sulfate as directed . Renal dosing considerations o Per 09/25/19 lab work from [facility], Dr. [name] - Scr 3.13; eGFR ~ 17 - Calculated CrCl ~ 15 mL/min o Per package labeling, escitalopram should be used with caution in patients with  severe renal impairment o Per olmesartan package labeling, AUC is increased ~3-fold in patients with CrCl <20 mL/minute o Package labeling of rosuvastatin recommends, dosing of rosuvastatin not exceed 10 mg once daily for patients with severe renal impairment (CrCl <30 mL/min) not on hemodialysis o Denies patient taking any over the counter NSAIDs or supplements . Counsel on importance of blood pressure monitoring and follow up to Cardiologist.  o Jake Garcia reports that he checks his blood pressure occasionally and that it has been "good", but does not have specific readings o Encourage patient/son to check blood pressure regularly, keep log and follow up with Cardiologist if BP consistently elevated or low o Per note from Nephrologist, recommended BP goal of less than 130/80 . Counsel on importance of medication adherence.  o Encourage son to use weekly pillbox as adherence tool. . Will route chart note along with message to Cardiologist regarding patient's latest renal function labs, impact on olmesartan level and recommend dose reduction of rosuvastatin  Patient Self Care Activities:  . Currently medications administered to patient by son, Jake Garcia . Patient attends medical appointments as scheduled with assistance of family o Upcoming Urology procedure on 10/22 o Next appointment with Nephrologist on 11/18  Initial goal documentation        Plan  The care management team will reach out to the patient again over the next 30 days.   [name], PharmD, [place] [employer] [phone]

## 2019-10-09 ENCOUNTER — Other Ambulatory Visit: Payer: Self-pay

## 2019-10-09 ENCOUNTER — Encounter (HOSPITAL_BASED_OUTPATIENT_CLINIC_OR_DEPARTMENT_OTHER): Payer: Self-pay | Admitting: *Deleted

## 2019-10-09 NOTE — Anesthesia Preprocedure Evaluation (Addendum)
Anesthesia Evaluation  Patient identified by MRN, date of birth, ID band Patient awake    Reviewed: Allergy & Precautions, NPO status , Patient's Chart, lab work & pertinent test results  Airway Mallampati: II  TM Distance: >3 FB Neck ROM: Full    Dental no notable dental hx. (+) Teeth Intact   Pulmonary neg pulmonary ROS,    Pulmonary exam normal breath sounds clear to auscultation       Cardiovascular Exercise Tolerance: Good hypertension, Pt. on medications + CAD, + Cardiac Stents and + CABG  Normal cardiovascular exam Rhythm:Regular Rate:Normal  DES  07/2011 CABG x 1 2009   Neuro/Psych UE Weakness  S/P fall 8/20 w subarachnoid hemorrhage CVA negative psych ROS   GI/Hepatic negative GI ROS, Neg liver ROS,   Endo/Other  negative endocrine ROS  Renal/GU Renal Insufficiency and CRFRenal disease     Musculoskeletal negative musculoskeletal ROS (+)   Abdominal   Peds  Hematology  (+) anemia ,   Anesthesia Other Findings   Reproductive/Obstetrics                            Anesthesia Physical Anesthesia Plan  ASA: III  Anesthesia Plan: General   Post-op Pain Management:    Induction: Intravenous  PONV Risk Score and Plan: 2 and Treatment may vary due to age or medical condition, Ondansetron and Dexamethasone  Airway Management Planned: LMA  Additional Equipment:   Intra-op Plan:   Post-operative Plan:   Informed Consent: I have reviewed the patients History and Physical, chart, labs and discussed the procedure including the risks, benefits and alternatives for the proposed anesthesia with the patient or authorized representative who has indicated his/her understanding and acceptance.     Dental advisory given  Plan Discussed with: CRNA  Anesthesia Plan Comments: (Hx taken with help of his two sons: one present here other over the phone . Condition stable .)        Anesthesia Quick Evaluation

## 2019-10-09 NOTE — Progress Notes (Addendum)
ADDENDUM:  Reviewed chart w/ dr Gifford Shave mda, stated pt to be assessed dos.   Spoke w/ via phone for pre-op interview--- Pt and Pt's son, Jake Garcia needs dos----  Istat 8             Lab results------  EKG 08-21-2019 epic, Last CBC / CMP result dated 09-25-2019 in care everywhere COVID test ------ 10-07-2019  Arrive at ------- 0630 NPO after ------ [place] Medications to take morning of surgery ----- NONE Diabetic medication ----- n/a Patient Special Instructions ----- n/a  Pre-Op special Istructions ----- pt poor historian with impaired memory.  Pt son Jake Garcia will know pt medication.  Pt high risk falls  Patient verbalized understanding of instructions that were given at this phone interview. Patient denies cardiac s&s, shortness of breath, chest pain, fever, cough a this phone interview.   Anesthesia Review: hx cad s/p cabg x1 2009 and DES 08/ 2012. Hx cva with residual deficit(upper extem. Weakness) 10/ 2016.  Hx thalassmia minor.  Hx CKD V.  Hx prostate cancer w/ mets.  Pt fell 08/ 2020 subarachnoid hemorrhage  s/p  Hematoma evacuation.  Pt stated has had dizziness with quick movements in past week.  PCP: Cassell Smiles NP Cardiologist : Dr Rockey Situ for CAD/ PAD/ HTN (lov 08-21-2019 epic) Nephrologist:  Dr Ross Marcus Chest x-ray :  EKG :  08-21-2019 epic Echo : 10-11-2015 epic Stress test: 12/ 2009 epic Cardiac Cath :  Last one 08-17-2011 epic (scanned in media) Sleep Study/ CPAP : NO  Blood Thinner/ Instructions Maryjane Hurter Dose: NO ASA / Instructions/ Last Dose : NO /  Per pt son, pt has not taken ASA 81 mg in several months

## 2019-10-09 NOTE — Patient Instructions (Signed)
Thank you allowing the Chronic Care Management Team to be a part of your care! It was a pleasure speaking with you today!     CCM (Chronic Care Management) Team    Janci Minor RN, BSN Nurse Care Coordinator  [phone]   [name] PharmD  Clinical Pharmacist  [phone]   Eula Fried LCSW Clinical Social Worker [phone]  Visit Information  Goals Addressed            This Visit's Progress   . PharmD - Medication Review       Current Barriers:  . Non Adherence to prescribed medication regimen . Cognitive deficits  Pharmacist Clinical Goal(s):  [name] Over the next 30 days, patient will work with CM Pharmacist to address needs related to medication review and medication adherence  Interventions: . Comprehensive medication review performed with patient and son, [name]; medication list updated in electronic medical record o Reports patient not currently taking aspirin 81 mg - Per 9/17 PCP note, patient admitted in [place], [name] on 8/21 for brain hemorrhage. Patient had emergency surgery. . Patient to have follow-up head CT scan for resolution of hemorrhage/follow up with Neurology - Patient with history of atherosclerosis - Advise patient/son to follow up with providers (Cardiology/Neurology) about whether to restart aspirin in future o Reports patient had temporarily stopped taking ferrous sulfate due to constipation. - Reports constipation now controlled with use of Senna (1 tablet daily as needed) and planning to restart ferrous sulfate as directed . Renal dosing considerations o Per 09/25/19 lab work from [facility], Dr. [name] - Scr 3.13; eGFR ~ 17 - Calculated CrCl ~ 15 mL/min o Per package labeling, escitalopram should be used with caution in patients with severe renal impairment o Per olmesartan package labeling, AUC is increased ~3-fold in patients with CrCl <20 mL/minute o Package labeling of rosuvastatin recommends, dosing of rosuvastatin not  exceed 10 mg once daily for patients with severe renal impairment (CrCl <30 mL/min) not on hemodialysis o Denies patient taking any over the counter NSAIDs or supplements . Counsel on importance of blood pressure monitoring and follow up to Cardiologist.  o Mr. Howell reports that he checks his blood pressure occasionally and that it has been "good", but does not have specific readings o Encourage patient/son to check blood pressure regularly, keep log and follow up with Cardiologist if BP consistently elevated or low o Per note from Nephrologist, recommended BP goal of less than 130/80 . Counsel on importance of medication adherence.  o Encourage son to use weekly pillbox as adherence tool. . Will route chart note along with message to Cardiologist regarding patient's latest renal function labs, impact on olmesartan level and recommend dose reduction of rosuvastatin  Patient Self Care Activities:  . Currently medications administered to patient by son, [name] . Patient attends medical appointments as scheduled with assistance of family o Upcoming Urology procedure on 10/22 o Next appointment with Nephrologist on 11/18  Initial goal documentation        The patient verbalized understanding of instructions provided today and declined a print copy of patient instruction materials.   The care management team will reach out to the patient again over the next 30 days.   [name], PharmD, [place] [employer] [phone]

## 2019-10-10 ENCOUNTER — Ambulatory Visit (HOSPITAL_BASED_OUTPATIENT_CLINIC_OR_DEPARTMENT_OTHER): Payer: Medicare Other | Admitting: Anesthesiology

## 2019-10-10 ENCOUNTER — Ambulatory Visit (HOSPITAL_BASED_OUTPATIENT_CLINIC_OR_DEPARTMENT_OTHER)
Admission: RE | Admit: 2019-10-10 | Discharge: 2019-10-10 | Disposition: A | Discharge status: Home / Self Care | Payer: Medicare Other | Attending: Urology | Admitting: Urology

## 2019-10-10 ENCOUNTER — Encounter (HOSPITAL_BASED_OUTPATIENT_CLINIC_OR_DEPARTMENT_OTHER)
Admission: RE | Disposition: A | Discharge status: Home / Self Care | Payer: Self-pay | Source: Home / Self Care | Attending: Urology

## 2019-10-10 ENCOUNTER — Encounter (HOSPITAL_BASED_OUTPATIENT_CLINIC_OR_DEPARTMENT_OTHER): Payer: Self-pay | Admitting: *Deleted

## 2019-10-10 ENCOUNTER — Other Ambulatory Visit: Payer: Self-pay

## 2019-10-10 DIAGNOSIS — N201 Calculus of ureter: Secondary | ICD-10-CM | POA: Diagnosis present

## 2019-10-10 DIAGNOSIS — I2510 Atherosclerotic heart disease of native coronary artery without angina pectoris: Secondary | ICD-10-CM | POA: Insufficient documentation

## 2019-10-10 DIAGNOSIS — C61 Malignant neoplasm of prostate: Secondary | ICD-10-CM | POA: Insufficient documentation

## 2019-10-10 DIAGNOSIS — Z79899 Other long term (current) drug therapy: Secondary | ICD-10-CM | POA: Insufficient documentation

## 2019-10-10 DIAGNOSIS — Z955 Presence of coronary angioplasty implant and graft: Secondary | ICD-10-CM | POA: Diagnosis not present

## 2019-10-10 DIAGNOSIS — Z951 Presence of aortocoronary bypass graft: Secondary | ICD-10-CM | POA: Diagnosis not present

## 2019-10-10 DIAGNOSIS — Z8673 Personal history of transient ischemic attack (TIA), and cerebral infarction without residual deficits: Secondary | ICD-10-CM | POA: Insufficient documentation

## 2019-10-10 DIAGNOSIS — D414 Neoplasm of uncertain behavior of bladder: Secondary | ICD-10-CM | POA: Diagnosis not present

## 2019-10-10 DIAGNOSIS — I10 Essential (primary) hypertension: Secondary | ICD-10-CM | POA: Insufficient documentation

## 2019-10-10 HISTORY — DX: Unspecified sequelae of cerebral infarction: I69.30

## 2019-10-10 HISTORY — DX: Calculus of ureter: N20.1

## 2019-10-10 HISTORY — DX: Thalassemia minor: D56.3

## 2019-10-10 HISTORY — DX: History of falling: Z91.81

## 2019-10-10 HISTORY — PX: CYSTOSCOPY/URETEROSCOPY/HOLMIUM LASER/STENT PLACEMENT: SHX6546

## 2019-10-10 HISTORY — DX: Malignant neoplasm of prostate: C61

## 2019-10-10 HISTORY — DX: Other specified health status: Z78.9

## 2019-10-10 HISTORY — DX: Other symptoms and signs involving the musculoskeletal system: R29.898

## 2019-10-10 HISTORY — DX: Personal history of other malignant neoplasm of large intestine: Z85.038

## 2019-10-10 HISTORY — DX: Chronic kidney disease, stage 5: N18.5

## 2019-10-10 HISTORY — DX: Presence of aortocoronary bypass graft: Z95.1

## 2019-10-10 HISTORY — DX: Unspecified hearing loss, unspecified ear: H91.90

## 2019-10-10 HISTORY — DX: Presence of coronary angioplasty implant and graft: Z95.5

## 2019-10-10 HISTORY — DX: Anemia in chronic kidney disease: D63.1

## 2019-10-10 HISTORY — DX: Disorder of arteries and arterioles, unspecified: I77.9

## 2019-10-10 HISTORY — DX: Other amnesia: R41.3

## 2019-10-10 HISTORY — DX: Personal history of other diseases of the circulatory system: Z86.79

## 2019-10-10 HISTORY — DX: Secondary hyperparathyroidism of renal origin: N25.81

## 2019-10-10 HISTORY — DX: Dizziness and giddiness: R42

## 2019-10-10 HISTORY — DX: Anemia in chronic kidney disease: N18.9

## 2019-10-10 HISTORY — DX: Peripheral vascular disease, unspecified: I73.9

## 2019-10-10 HISTORY — DX: Mixed hyperlipidemia: E78.2

## 2019-10-10 LAB — POCT I-STAT, CHEM 8
BUN: 46 mg/dL — ABNORMAL HIGH (ref 8–23)
Calcium, Ion: 1.24 mmol/L (ref 1.15–1.40)
Chloride: 112 mmol/L — ABNORMAL HIGH (ref 98–111)
Creatinine, Ser: 3.4 mg/dL — ABNORMAL HIGH (ref 0.61–1.24)
Glucose, Bld: 100 mg/dL — ABNORMAL HIGH (ref 70–99)
HCT: 26 % — ABNORMAL LOW (ref 39.0–52.0)
Hemoglobin: 8.8 g/dL — ABNORMAL LOW (ref 13.0–17.0)
Potassium: 4.8 mmol/L (ref 3.5–5.1)
Sodium: 140 mmol/L (ref 135–145)
TCO2: 18 mmol/L — ABNORMAL LOW (ref 22–32)

## 2019-10-10 SURGERY — CYSTOSCOPY/URETEROSCOPY/HOLMIUM LASER/STENT PLACEMENT
Anesthesia: General | Site: Ureter | Laterality: Left

## 2019-10-10 MED ORDER — BELLADONNA ALKALOIDS-OPIUM 16.2-60 MG RE SUPP
RECTAL | Status: DC | PRN
  Administered 2019-10-10: 1 via RECTAL

## 2019-10-10 MED ORDER — FENTANYL CITRATE (PF) 100 MCG/2ML IJ SOLN
25.0000 ug | INTRAMUSCULAR | Status: DC | PRN
  Filled 2019-10-10: qty 1

## 2019-10-10 MED ORDER — GLYCOPYRROLATE 0.2 MG/ML IJ SOLN
INTRAMUSCULAR | Status: DC | PRN
  Administered 2019-10-10: 0.2 mg via INTRAVENOUS

## 2019-10-10 MED ORDER — SODIUM CHLORIDE 0.9 % IR SOLN
Status: DC | PRN
  Administered 2019-10-10: 3000 mL

## 2019-10-10 MED ORDER — ONDANSETRON HCL 4 MG/2ML IJ SOLN
INTRAMUSCULAR | Status: AC
  Filled 2019-10-10: qty 2

## 2019-10-10 MED ORDER — ACETAMINOPHEN 10 MG/ML IV SOLN
INTRAVENOUS | Status: DC | PRN
  Administered 2019-10-10: 1000 mg via INTRAVENOUS

## 2019-10-10 MED ORDER — BELLADONNA ALKALOIDS-OPIUM 16.2-60 MG RE SUPP
RECTAL | Status: AC
  Filled 2019-10-10: qty 1

## 2019-10-10 MED ORDER — FUROSEMIDE 10 MG/ML IJ SOLN
INTRAMUSCULAR | Status: DC | PRN
  Administered 2019-10-10: 20 mg via INTRAMUSCULAR

## 2019-10-10 MED ORDER — EPHEDRINE 5 MG/ML INJ
INTRAVENOUS | Status: AC
  Filled 2019-10-10: qty 10

## 2019-10-10 MED ORDER — FENTANYL CITRATE (PF) 100 MCG/2ML IJ SOLN
INTRAMUSCULAR | Status: AC
  Filled 2019-10-10: qty 2

## 2019-10-10 MED ORDER — ATROPINE SULFATE 1 MG/10ML IJ SOSY
PREFILLED_SYRINGE | INTRAMUSCULAR | Status: AC
  Filled 2019-10-10: qty 10

## 2019-10-10 MED ORDER — GLYCOPYRROLATE PF 0.2 MG/ML IJ SOSY
PREFILLED_SYRINGE | INTRAMUSCULAR | Status: AC
  Filled 2019-10-10: qty 1

## 2019-10-10 MED ORDER — CIPROFLOXACIN HCL 500 MG PO TABS: 500.0000 mg | ORAL_TABLET | Freq: Once | ORAL | 0 refills | Status: AC

## 2019-10-10 MED ORDER — PROPOFOL 10 MG/ML IV BOLUS
INTRAVENOUS | Status: AC
  Filled 2019-10-10: qty 20

## 2019-10-10 MED ORDER — SODIUM CHLORIDE 0.9 % IV SOLN
INTRAVENOUS | Status: DC
  Administered 2019-10-10 (×2): via INTRAVENOUS
  Filled 2019-10-10: qty 1000

## 2019-10-10 MED ORDER — ACETAMINOPHEN 10 MG/ML IV SOLN
1000.0000 mg | Freq: Once | INTRAVENOUS | Status: DC | PRN
  Filled 2019-10-10: qty 100

## 2019-10-10 MED ORDER — EPHEDRINE SULFATE 50 MG/ML IJ SOLN
INTRAMUSCULAR | Status: DC | PRN
  Administered 2019-10-10: 5 mg via INTRAVENOUS

## 2019-10-10 MED ORDER — FENTANYL CITRATE (PF) 100 MCG/2ML IJ SOLN
INTRAMUSCULAR | Status: DC | PRN
  Administered 2019-10-10: 25 ug via INTRAVENOUS

## 2019-10-10 MED ORDER — ONDANSETRON HCL 4 MG/2ML IJ SOLN
4.0000 mg | Freq: Once | INTRAMUSCULAR | Status: DC | PRN
  Filled 2019-10-10: qty 2

## 2019-10-10 MED ORDER — WHITE PETROLATUM EX OINT
TOPICAL_OINTMENT | CUTANEOUS | Status: AC
  Filled 2019-10-10: qty 5

## 2019-10-10 MED ORDER — ONDANSETRON HCL 4 MG/2ML IJ SOLN
INTRAMUSCULAR | Status: DC | PRN
  Administered 2019-10-10: 4 mg via INTRAVENOUS

## 2019-10-10 MED ORDER — IOHEXOL 300 MG/ML  SOLN
INTRAMUSCULAR | Status: DC | PRN
  Administered 2019-10-10: 12 mL via URETHRAL

## 2019-10-10 MED ORDER — LIDOCAINE HCL 1 % IJ SOLN
INTRAMUSCULAR | Status: DC | PRN
  Administered 2019-10-10: 40 mg via INTRADERMAL

## 2019-10-10 MED ORDER — CEFAZOLIN SODIUM-DEXTROSE 2-4 GM/100ML-% IV SOLN
2.0000 g | INTRAVENOUS | Status: AC
  Administered 2019-10-10: 2 g via INTRAVENOUS
  Filled 2019-10-10: qty 100

## 2019-10-10 MED ORDER — CEFAZOLIN SODIUM-DEXTROSE 2-4 GM/100ML-% IV SOLN
INTRAVENOUS | Status: AC
  Filled 2019-10-10: qty 100

## 2019-10-10 MED ORDER — PROPOFOL 10 MG/ML IV BOLUS
INTRAVENOUS | Status: DC | PRN
  Administered 2019-10-10: 70 mg via INTRAVENOUS
  Administered 2019-10-10: 30 mg via INTRAVENOUS

## 2019-10-10 MED ORDER — ETOMIDATE 2 MG/ML IV SOLN
INTRAVENOUS | Status: AC
  Filled 2019-10-10: qty 10

## 2019-10-10 MED ORDER — LIDOCAINE HCL URETHRAL/MUCOSAL 2 % EX GEL
CUTANEOUS | Status: DC | PRN
  Administered 2019-10-10: 1 via URETHRAL

## 2019-10-10 MED ORDER — FUROSEMIDE 10 MG/ML IJ SOLN
INTRAMUSCULAR | Status: AC
  Filled 2019-10-10: qty 2

## 2019-10-10 MED ORDER — ATROPINE SULFATE 0.4 MG/ML IJ SOLN
INTRAMUSCULAR | Status: DC | PRN
  Administered 2019-10-10: 0.2 mg via INTRAVENOUS

## 2019-10-10 SURGICAL SUPPLY — 25 items
APL SKNCLS STERI-STRIP NONHPOA (GAUZE/BANDAGES/DRESSINGS) ×1
BAG DRAIN URO-CYSTO SKYTR STRL (DRAIN) ×3 IMPLANT
BAG DRN UROCATH (DRAIN) ×1
BASKET LASER NITINOL 1.9FR (BASKET) ×2 IMPLANT
BENZOIN TINCTURE PRP APPL 2/3 (GAUZE/BANDAGES/DRESSINGS) ×2 IMPLANT
BSKT STON RTRVL 120 1.9FR (BASKET) ×1
CATH URET 5FR 28IN OPEN ENDED (CATHETERS) ×3 IMPLANT
CATH URET DUAL LUMEN 6-10FR 50 (CATHETERS) IMPLANT
CLOTH BEACON ORANGE TIMEOUT ST (SAFETY) ×3 IMPLANT
DRSG TEGADERM 2-3/8X2-3/4 SM (GAUZE/BANDAGES/DRESSINGS) ×2 IMPLANT
EXTRACTOR STONE 1.7FRX115CM (UROLOGICAL SUPPLIES) IMPLANT
FIBER LASER FLEXIVA 365 (UROLOGICAL SUPPLIES) ×2 IMPLANT
FIBER LASER TRAC TIP (UROLOGICAL SUPPLIES) IMPLANT
GLOVE BIO SURGEON STRL SZ7.5 (GLOVE) ×3 IMPLANT
GOWN STRL REUS W/TWL XL LVL3 (GOWN DISPOSABLE) ×3 IMPLANT
GUIDEWIRE STR DUAL SENSOR (WIRE) ×3 IMPLANT
IV NS IRRIG 3000ML ARTHROMATIC (IV SOLUTION) ×6 IMPLANT
KIT TURNOVER CYSTO (KITS) ×3 IMPLANT
MANIFOLD NEPTUNE II (INSTRUMENTS) IMPLANT
NS IRRIG 500ML POUR BTL (IV SOLUTION) ×3 IMPLANT
PACK CYSTO (CUSTOM PROCEDURE TRAY) ×3 IMPLANT
STENT URET 6FRX24 CONTOUR (STENTS) ×2 IMPLANT
TUBE CONNECTING 12'X1/4 (SUCTIONS)
TUBE CONNECTING 12X1/4 (SUCTIONS) IMPLANT
TUBING UROLOGY SET (TUBING) ×3 IMPLANT

## 2019-10-10 NOTE — OR Nursing (Signed)
Pt unable to void.  States he's not in distress feel like he could go but unable to.  Pt states its right there I just cant go but abdomen not distended.   Pt has been consuming liquids since inphase 2 approx 6-8 cups of water and a cup of coffee.  Bladder scan indicates between 335-377 cc urine in bladder.  Dr. Louis Meckel called regarding pt status and orders received to in and out cath pt or cath pt and let pt go home with a foley cathetor to be d/c on Monday October 26.  Make sure pt understands if he is in and out cathed he will need to go to ER to be catherized if unable to void approx 6-8 hours after he is at home.  Pt and pt son understand.  Pt decides to go with in and out cath.  Approx 3pm in and out cath pt using sterile technique.  Pt expels approx 325 cc clear light yellow urine.  Pt is discharged to home and understands whats expected of him.  He is encouraged to start drinking water when he gets home.  Darin Engels RN.

## 2019-10-10 NOTE — Discharge Instructions (Signed)
DISCHARGE INSTRUCTIONS FOR KIDNEY STONE/URETERAL STENT   MEDICATIONS:  1. Resume all your other meds from home - except do not take any extra narcotic pain meds that you may have at home.  2. Take Cipro one hour prior to removal of your stent.  3.  Use Tylenol for pain.  ACTIVITY:  1. No strenuous activity x 1week  2. No driving while on narcotic pain medications  3. Drink plenty of water  4. Continue to walk at home - you can still get blood clots when you are at home, so keep active, but don't over do it.  5. May return to work/school tomorrow or when you feel ready   BATHING:  1. You can shower and we recommend daily showers  2. You have a string coming from your urethra: The stent string is attached to your ureteral stent. Do not pull on this.   SIGNS/SYMPTOMS TO CALL:  Please call us if you have a fever greater than 101.5, uncontrolled nausea/vomiting, uncontrolled pain, dizziness, unable to urinate, bloody urine, chest pain, shortness of breath, leg swelling, leg pain, redness around wound, drainage from wound, or any other concerns or questions.   You can reach [place] at [phone].   FOLLOW-UP:  1.You have an appointment in 6 weeks with a ultrasound of your kidneys prior.   2. You have a string attached to your stent, you may remove it on Oct 26. To do this, pull the strings until the stents are completely removed. You may feel an odd sensation in your back.  Post Anesthesia Home Care Instructions  Activity: Get plenty of rest for the remainder of the day. A responsible individual must stay with you for 24 hours following the procedure.  For the next 24 hours, DO NOT: -Drive a car -Paediatric nurse -Drink alcoholic beverages -Take any medication unless instructed by your physician -Make any legal decisions or sign important papers.  Meals: Start with liquid foods such as gelatin or soup. Progress to regular foods as tolerated. Avoid greasy, spicy, heavy foods. If nausea  and/or vomiting occur, drink only clear liquids until the nausea and/or vomiting subsides. Call your physician if vomiting continues.  Special Instructions/Symptoms: Your throat may feel dry or sore from the anesthesia or the breathing tube placed in your throat during surgery. If this causes discomfort, gargle with warm salt water. The discomfort should disappear within 24 hours.  If you had a scopolamine patch placed behind your ear for the management of post- operative nausea and/or vomiting:  1. The medication in the patch is effective for 72 hours, after which it should be removed.  Wrap patch in a tissue and discard in the trash. Wash hands thoroughly with soap and water. 2. You may remove the patch earlier than 72 hours if you experience unpleasant side effects which may include dry mouth, dizziness or visual disturbances. 3. Avoid touching the patch. Wash your hands with soap and water after contact with the patch.

## 2019-10-10 NOTE — Anesthesia Postprocedure Evaluation (Signed)
Anesthesia Post Note  Patient: Jake Garcia  Procedure(s) Performed: LEFT URETEROSCOPY/HOLMIUM LASER/STENT PLACEMENT (Left Ureter)     Patient location during evaluation: PACU Anesthesia Type: General Level of consciousness: awake and alert Pain management: pain level controlled Vital Signs Assessment: post-procedure vital signs reviewed and stable Respiratory status: spontaneous breathing, nonlabored ventilation, respiratory function stable and patient connected to nasal cannula oxygen Cardiovascular status: blood pressure returned to baseline and stable Postop Assessment: no apparent nausea or vomiting Anesthetic complications: no    Last Vitals:  Vitals:   10/10/19 1015 10/10/19 1030  BP: (!) 152/87 (!) 153/57  Pulse: 72 66  Resp: 18 14  Temp:    SpO2: 98% 95%    Last Pain:  Vitals:   10/10/19 1015  TempSrc:   PainSc: 0-No pain                 Barnet Glasgow

## 2019-10-10 NOTE — Transfer of Care (Signed)
Immediate Anesthesia Transfer of Care Note  Patient: Jake Garcia  Procedure(s) Performed: LEFT URETEROSCOPY/HOLMIUM LASER/STENT PLACEMENT (Left Ureter)  Patient Location: PACU  Anesthesia Type:General  Level of Consciousness: awake, oriented and patient cooperative  Airway & Oxygen Therapy: Patient Spontanous Breathing and Patient connected to nasal cannula oxygen  Post-op Assessment: Report given to RN and Post -op Vital signs reviewed and stable  Post vital signs: Reviewed and stable  Last Vitals:  Vitals Value Taken Time  BP 153/66 10/10/19 0958  Temp    Pulse 75 10/10/19 1000  Resp 15 10/10/19 1000  SpO2 97 % 10/10/19 1000  Vitals shown include unvalidated device data.  Last Pain:  Vitals:   10/10/19 0738  TempSrc: Oral  PainSc: 0-No pain      Patients Stated Pain Goal: 5 (65/68/12 7517)  Complications: No apparent anesthesia complications

## 2019-10-10 NOTE — Anesthesia Procedure Notes (Signed)
Date/Time: 10/10/2019 8:55 AM Performed by: Garrel Ridgel, CRNA Induction Type: IV induction

## 2019-10-10 NOTE — Anesthesia Procedure Notes (Signed)
Procedure Name: LMA Insertion Date/Time: 10/10/2019 8:55 AM Performed by: Garrel Ridgel, CRNA Pre-anesthesia Checklist: Patient identified, Emergency Drugs available, Suction available, Patient being monitored and Timeout performed Patient Re-evaluated:Patient Re-evaluated prior to induction Oxygen Delivery Method: Circle system utilized Preoxygenation: Pre-oxygenation with 100% oxygen Induction Type: IV induction Ventilation: Mask ventilation without difficulty LMA: LMA inserted LMA Size: 4.0 Number of attempts: 1 Placement Confirmation: breath sounds checked- equal and bilateral and positive ETCO2 Tube secured with: Tape Dental Injury: Teeth and Oropharynx as per pre-operative assessment

## 2019-10-10 NOTE — H&P (Signed)
The patient is here today to discuss treatment of his 4 mm left distal ureteral stone. This was found incidentally during the patient's evaluation for metastatic prostate cancer. The patient does have some worsening renal function over the past several years and his most recent creatinine was 3.2. His left kidney did look symmetric compared to his right kidney and there was no hydroureteronephrosis. However, given the patient's renal dysfunction we opted to proceed with treatment of his stone. He presents today for that discussion.   Interval: The patient states that 2 days ago he had some hematuria and some pain. This resolved quickly any did "passed something". He denies any ongoing pain. Denies any fevers or chills. Denies any dysuria or gross hematuria.     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   Tobacco Use Assessment Completed: Used Tobacco in last 30 days? Has never drank.  Drinks 1 caffeinated drink per day.    REVIEW OF SYSTEMS:    GU Review Male:   Patient denies frequent urination, hard to postpone urination, burning/ pain with urination, get up at night to urinate, leakage of urine, stream  starts and stops, trouble starting your stream, have to strain to urinate , erection problems, and penile pain.  Gastrointestinal (Upper):   Patient denies nausea, vomiting, and indigestion/ heartburn.  Gastrointestinal (Lower):   Patient denies diarrhea and constipation.  Constitutional:   Patient denies fever, night sweats, weight loss, and fatigue.  Skin:   Patient denies skin rash/ lesion and itching.  Eyes:   Patient denies blurred vision and double vision.  Ears/ Nose/ Throat:   Patient denies sore throat and sinus problems.  Hematologic/Lymphatic:   Patient denies swollen glands and easy bruising.  Cardiovascular:   Patient denies leg swelling and chest pains.  Respiratory:   Patient denies cough and shortness of breath.  Endocrine:   Patient denies excessive thirst.  Musculoskeletal:   Patient denies back pain and joint pain.  Neurological:   Patient denies headaches and dizziness.  Psychologic:   Patient denies depression and anxiety.   Notes: pt advises blood in urine 3 days ago     VITAL SIGNS:      10/01/2019 02:18 PM  Weight 150 lb / 68.04 kg  Height 66 in / 167.64 cm  BP 167/66 mmHg  Heart Rate 78 /min  Temperature 98.2 F / 36.7 C  BMI 24.2 kg/m   MULTI-SYSTEM PHYSICAL EXAMINATION:    Constitutional: Well-nourished. No physical deformities. Normally developed. Good grooming.   Respiratory: Normal breath sounds. No labored breathing, no use of accessory muscles.   Cardiovascular: Regular rate and rhythm. No murmur, no gallop. Normal  temperature, normal extremity pulses, no swelling, no varicosities.      PAST DATA REVIEWED:  Source Of History:  Patient  Records Review:   Previous Doctor Records, Previous Patient Records, POC Tool  X-Ray Review: C.T. Abdomen/Pelvis: Reviewed Films. Discussed With Patient.     09/06/19 05/21/19 05/15/19 10/24/18 08/02/18 04/24/18 01/22/18 10/16/17  PSA  Total PSA 14.4 ng/dl 8.46 ng/mL 7.1 ng/dl 3.5 ng/dl 3.7 ng/dl 3.9 ng/dl 8.9  ng/dl 2.9 ng/dl    10/24/18  Hormones  Testosterone, Total < 3 pg/dL    PROCEDURES:         KUB - 74018  A single view of the abdomen is obtained. Renal shadows are easily visualized bilaterally. There are no stones appreciated within the expected location in either renal pelvis. The patient has a 4 mm stone in the left distal ureter as seen on the previous CT scan. There are no additional calcifications along the expected location of either ureter bilaterally.  Gas pattern is grossly normal. No significant bony abnormalities.      Impression: The patient's stone today is visible and has not been passed.  Patient confirmed No Neulasta OnPro Device.            Urinalysis w/Scope Dipstick Dipstick Cont'd Micro  Color: Yellow Bilirubin: Neg mg/dL WBC/hpf: 0 - 5/hpf  Appearance: Clear Ketones: Neg mg/dL RBC/hpf: NS (Not Seen)  Specific Gravity: 1.025 Blood: Neg ery/uL Bacteria: NS (Not Seen)  pH: <=5.0 Protein: 3+ mg/dL Cystals: Amorph Urates  Glucose: Neg mg/dL Urobilinogen: 0.2 mg/dL Casts: NS (Not Seen)    Nitrites: Neg Trichomonas: Not Present    Leukocyte Esterase: Neg leu/uL Mucous: Present      Epithelial Cells: NS (Not Seen)      Yeast: NS (Not Seen)      Sperm: Not Present    ASSESSMENT:      ICD-10 Details  1 GU:   Castrate resistant prostate cancer - Z19.2   2   Ureteral calculus - N20.1    PLAN:           Orders Labs Urine Culture  X-Rays: KUB          Schedule         Document Letter(s):  Created for Patient: Clinical Summary         Notes:   The patient has a 4 mm left distal ureteral stone. We discussed treatment options and I recommended distal ureteroscopy, laser lithotripsy and possible stent placement. I went through the surgery with him in detail including the risks and the benefits. We discussed alternative options and after going through everything we have collectively agreed to proceed with ureteroscopy.

## 2019-10-10 NOTE — Op Note (Addendum)
Preoperative diagnosis: left ureteral calculus  Postoperative diagnosis: left ureteral calculus, small bladder tumor, <30mm  Procedure:  1. Cystoscopy 2. left ureteroscopy and stone removal 3. Ureteroscopic laser lithotripsy 4. left 23F x 24cm ureteral stent placement  5. left retrograde pyelography with interpretation 6. Small bladder tumor biopsy  - 80mm  Surgeon: Ardis Hughs, MD  Anesthesia: General  Complications: None  Intraoperative findings:  #1: Upon cystoscopy at the posterior left trigone there was noted to be approximately a 2 mm papillary lesion on a very small narrow stalk consistent with a very low-grade transitional cell carcinoma.  This was biopsied and sent to pathology. #2: Left retrograde pyelography demonstrated a filling defect within the left ureter consistent with the patient's known calculus without other abnormalities.  EBL: Minimal  Specimens: 1. left ureteral calculus  Disposition of specimens: [facility] for stone analysis  Indication: Jake Garcia is a 85 y.o.   patient with a left ureteral stone and associated left symptoms. After reviewing the management options for treatment, the patient elected to proceed with the above surgical procedure(s). We have discussed the potential benefits and risks of the procedure, side effects of the proposed treatment, the likelihood of the patient achieving the goals of the procedure, and any potential problems that might occur during the procedure or recuperation. Informed consent has been obtained.   Description of procedure:  The patient was taken to the operating room and general anesthesia was induced.  The patient was placed in the dorsal lithotomy position, prepped and draped in the usual sterile fashion, and preoperative antibiotics were administered. A preoperative time-out was performed.   Cystourethroscopy was performed.  The patient's urethra was examined and was normal  demonstrated bilobar prostatic hypertrophy. The bladder was then systematically examined in its entirety. There was no evidence for any bladder tumors, stones, but there was a very small papillary lesion on a very small narrow stalk.  Given its appearance, I opted at this point to remove this lesion with a flexible biopsy forcep.  This was sent to the pathology lab for further evaluation.  There was minimal bleeding associated with it.  No fulguration was required.  Attention then turned to the left ureteral orifice and a ureteral catheter was used to intubate the ureteral orifice.  Omnipaque contrast was injected through the ureteral catheter and a retrograde pyelogram was performed with findings as dictated above.  A 0.38 sensor guidewire was then advanced up the left ureter into the renal pelvis under fluoroscopic guidance. The 6 Fr semirigid ureteroscope was then advanced into the ureter next to the guidewire and the calculus was identified.   The stone was then fragmented with the 365 micron holmium laser fiber on a setting of 1.0 and frequency of 10 Hz.   All stones were then removed from the ureter with an N-gage nitinol basket.  Reinspection of the ureter revealed no remaining visible stones or fragments.   The wire was then backloaded through the cystoscope and a ureteral stent was advance over the wire using Seldinger technique.  The stent was positioned appropriately under fluoroscopic and cystoscopic guidance.  The wire was then removed with an adequate stent curl noted in the renal pelvis as well as in the bladder.  The bladder was then emptied and the procedure ended.  The patient appeared to tolerate the procedure well and without complications.  The patient was able to be awakened and transferred to the recovery unit in satisfactory condition.   Disposition:  The tether of the stent was left on and secured to the ventral aspect of the patient's penis.  Instructions for removing the stent  have been provided to the patient. The patient has been scheduled for followup in 6 weeks with a renal ultrasound.

## 2019-10-10 NOTE — Interval H&P Note (Signed)
History and Physical Interval Note:  10/10/2019 8:16 AM  Jake Garcia  has presented today for surgery, with the diagnosis of LEFT URETERAL STONE.  The various methods of treatment have been discussed with the patient and family. After consideration of risks, benefits and other options for treatment, the patient has consented to  Procedure(s): LEFT URETEROSCOPY/HOLMIUM LASER/STENT PLACEMENT (Left) as a surgical intervention.  The patient's history has been reviewed, patient examined, no change in status, stable for surgery.  I have reviewed the patient's chart and labs.  Questions were answered to the patient's satisfaction.     Ardis Hughs

## 2019-10-11 ENCOUNTER — Encounter (HOSPITAL_BASED_OUTPATIENT_CLINIC_OR_DEPARTMENT_OTHER): Payer: Self-pay | Admitting: Urology

## 2019-10-11 LAB — SURGICAL PATHOLOGY

## 2019-10-18 ENCOUNTER — Ambulatory Visit: Payer: Self-pay | Admitting: Licensed Clinical Social Worker

## 2019-10-18 ENCOUNTER — Telehealth: Payer: Self-pay

## 2019-10-18 NOTE — Chronic Care Management (AMB) (Signed)
  Care Management   Follow Up Note   10/18/2019 Name: LOFTON LEON MRN: [id number] DOB: Oct 22, 1929  Referred by: [name], NP Reason for referral : Care Coordination   TREVIN GARTRELL is a 85 y.o. year old male who is a primary care patient of [name], NP. The care management team was consulted for assistance with care management and care coordination needs.    Review of patient status, including review of consultants reports, relevant laboratory and other test results, and collaboration with appropriate care team members and the patient's provider was performed as part of comprehensive patient evaluation and provision of chronic care management services.    LCSW completed CCM outreach attempt today but was unable to reach patient successfully. A HIPPA compliant voice message was left encouraging patient to return call once available. LCSW rescheduled CCM SW appointment as well.  A HIPPA compliant phone message was left for the patient providing contact information and requesting a return call.   Eula Fried, BSW, MSW, [email or web address] Phone: [phone]

## 2019-10-20 ENCOUNTER — Emergency Department
Admission: EM | Admit: 2019-10-20 | Discharge: 2019-10-20 | Discharge status: Against Medical Advice | Payer: Medicare Other

## 2019-10-22 ENCOUNTER — Telehealth: Payer: Self-pay

## 2019-10-22 NOTE — Telephone Encounter (Signed)
Patient has had nose bleeds for 1 1/1/2 weeks.  They described this one this morning as a horror movie.  Cleave Ternes [phone] is the doctor in the family and he was the caller today.  He is requesting labs and a referral to ENT.

## 2019-10-22 NOTE — Telephone Encounter (Addendum)
I called and spoke with the patient son Jake Garcia and he informed me that the nosebleed has subsided. He really couldn't give me any estimatation on about how much blood lose. Jake Garcia state that he is currently out of town and his brother is with his father. He reports intermittent bleeding for the last I.5 weeks at least once everyday. I informed him that if he feel like he's lost a significant amount of blood he needs to go to the emergency room were they can do stat labs. If its not an emergency we can she his father in the office on Friday morning. He said he will call me back after he talks to his brother.

## 2019-10-24 ENCOUNTER — Telehealth: Payer: Self-pay

## 2019-10-28 ENCOUNTER — Ambulatory Visit: Payer: Medicare Other | Admitting: Family Medicine

## 2019-10-29 ENCOUNTER — Telehealth: Payer: Self-pay

## 2019-10-30 ENCOUNTER — Telehealth: Payer: Self-pay | Admitting: *Deleted

## 2019-10-30 MED ORDER — ROSUVASTATIN CALCIUM 10 MG PO TABS: 10.0000 mg | ORAL_TABLET | Freq: Every day | ORAL | 3 refills | Status: AC

## 2019-10-30 NOTE — Telephone Encounter (Signed)
-----   Message from Minna Merritts, MD sent at 10/26/2019  5:01 PM EST ----- Can we decrease the Crestor down to 10 mg daily Thx TG  ----- Message ----- From: Vella Raring, [facility] Sent: 10/09/2019   3:07 PM EST To: Minna Merritts, MD  Dr. Rockey Situ  I had the opportunity to perform a medication review with Mr. Palladino and his son, [name], on 10/20. I note that patient's last lab work from his visit with Dr. [name] on 10/7 showed a creatinine of 3.13 (calculated CrCl ~15 mL/min). Based on this level of renal impairment,   1) Please consider reducing patient's rosuvastatin dose to 10 mg once daily (recommendation from labeling)  2) Please note that due to it renal clearance, AUC of olmesartan has been shown to increase ~3-fold in patients with CrCl <20 mL/min. Encouraged patient/son to monitor blood pressure and keep log and follow up with your office.  Thank you!  [name], PharmD, [place] [employer] [phone]

## 2019-10-30 NOTE — Telephone Encounter (Signed)
Spoke with patient and reviewed that provider wanted to decrease his crestor from 20 mg to 10 mg once daily. He verbalized understanding of these changes with no further questions at this time.

## 2019-11-07 ENCOUNTER — Ambulatory Visit: Payer: Self-pay | Admitting: Pharmacist

## 2019-11-07 DIAGNOSIS — N186 End stage renal disease: Secondary | ICD-10-CM

## 2019-11-07 DIAGNOSIS — Z8679 Personal history of other diseases of the circulatory system: Secondary | ICD-10-CM

## 2019-11-07 DIAGNOSIS — I2510 Atherosclerotic heart disease of native coronary artery without angina pectoris: Secondary | ICD-10-CM

## 2019-11-07 NOTE — Patient Instructions (Signed)
Thank you allowing the Chronic Care Management Team to be a part of your care! It was a pleasure speaking with you today!     CCM (Chronic Care Management) Team    Janci Minor RN, BSN Nurse Care Coordinator  [phone]   [name] PharmD  Clinical Pharmacist  [phone]   Eula Fried LCSW Clinical Social Worker [phone]  Visit Information  Goals Addressed            This Visit's Progress   . PharmD - Medication Review       Current Barriers:  . Non Adherence to prescribed medication regimen . Cognitive deficits  Pharmacist Clinical Goal(s):  [name] Over the next 30 days, patient will work with CM Pharmacist to address needs related to medication review and medication adherence  Interventions: . Perform chart review . Follow up regarding repeat head CT scan for resolution of hemorrhage/follow up with Neurology as recommended by NP Cassell Smiles per note from 9/17.  o Daughter states this has not been completed, but states that she will follow up. . Again advise follow up with providers (Cardiology/Neurology) about whether to restart aspirin  . Counsel on importance of blood pressure monitoring and follow up to Cardiologist.  o Reports patient has been checking his own BP, but not keeping log o Encourage daughter for patient to check blood pressure regularly, keep log and follow up with Cardiologist if BP consistently elevated or low o Per note from Nephrologist, recommended BP goal of less than 130/80  Patient Self Care Activities:  . Currently medications administered to patient by son, [name] . Patient attends medical appointments as scheduled with assistance of family   Please see past updates related to this goal by clicking on the "Past Updates" button in the selected goal         The patient verbalized understanding of instructions provided today and declined a print copy of patient instruction materials.   The care management team will  reach out to the patient again over the next 30 days.   [name], PharmD, [place] [employer] [phone]

## 2019-11-07 NOTE — Chronic Care Management (AMB) (Signed)
Chronic Care Management   Follow Up Note   11/07/2019 Name: Jake Garcia MRN: [id number] DOB: 07-18-1929  Referred by: Jake College, NP (Inactive) Reason for referral : Chronic Care Management (Caregiver Phone Call)   Jake Garcia is a 85 y.o. year old male who is a primary care patient of Jake College, NP (Inactive). The CCM team was consulted for assistance with chronic disease management and care coordination needs. Jake Garcia has a past medical history including but not limited to recent subarachnoid hemorrhage (07/2019), CKD- Stage V, atherosclerosis, peripheral artery disease, carotid artery disease, hypertension, depression, prostate cancer and kidney stone.  Outreach call to Jake Garcia, Jake Garcia. Note daughter listed on patient's designated party release in chart.   Review of patient status, including review of consultants reports, relevant laboratory and other test results, and collaboration with appropriate care team members and the patient's provider was performed as part of comprehensive patient evaluation and provision of chronic care management services.     Outpatient Encounter Medications as of 11/07/2019  Medication Sig  . amLODipine-olmesartan (AZOR) 10-40 MG tablet Take 1 tablet by mouth daily. (Patient taking differently: Take 1 tablet by mouth daily. )  . aspirin EC 81 MG tablet Take 1 tablet (81 mg total) by mouth daily.  [name] escitalopram (LEXAPRO) 5 MG tablet Take 1 tablet (5 mg total) by mouth daily.  . ferrous sulfate 325 (65 FE) MG tablet Take 1 tablet (325 mg total) by mouth daily.  . multivitamin-lutein (OCUVITE-LUTEIN) CAPS capsule Take 1 capsule by mouth daily.  . rosuvastatin (CRESTOR) 10 MG tablet Take 1 tablet (10 mg total) by mouth daily.  [name] senna (SENOKOT) 8.6 MG TABS tablet Take 1 tablet by mouth daily as needed for mild constipation.  . tamsulosin (FLOMAX) 0.4 MG CAPS capsule Take 0.4 mg by mouth daily after supper.    No  facility-administered encounter medications on file as of 11/07/2019.     Goals Addressed            This Visit's Progress   . PharmD - Medication Review       Current Barriers:  . Non Adherence to prescribed medication regimen . Cognitive deficits  Pharmacist Clinical Goal(s):  [name] Over the next 30 days, patient will work with CM Pharmacist to address needs related to medication review and medication adherence  Interventions: . Perform chart review o Per 11/11 telephone note from Cardiologist, Dr. Rockey Situ agreed with recommendation to decrease patient's rosuvastatin dose to 10 mg once daily, due to his renal impairment. Reports patient notified and new prescription sent to pharmacy. . Follow up regarding repeat head CT scan for resolution of hemorrhage/follow up with Neurology as recommended by NP Jake Garcia per note from 9/17.  o Daughter states this has not been completed, but states that she will follow up. . Again advise follow up with providers (Cardiology/Neurology) about whether to restart aspirin  . Counsel on importance of blood pressure monitoring and follow up to Cardiologist.  o Reports patient has been checking his own BP, but not keeping log o Encourage daughter for patient to check blood pressure regularly, keep log and follow up with Cardiologist if BP consistently elevated or low o Per note from Nephrologist, recommended BP goal of less than 130/80  Patient Self Care Activities:  . Currently medications administered to patient by son, Jake Garcia . Patient attends medical appointments as scheduled with assistance of family   Please see past updates related to this goal by  clicking on the "Past Updates" button in the selected goal         Plan  The care management team will reach out to the patient again over the next 30 days.   [name], PharmD, [place] [employer] [phone]

## 2019-11-11 ENCOUNTER — Ambulatory Visit: Payer: Self-pay | Admitting: *Deleted

## 2019-11-11 ENCOUNTER — Telehealth: Payer: Self-pay | Admitting: Nurse Practitioner

## 2019-11-11 ENCOUNTER — Ambulatory Visit: Payer: Medicare Other | Admitting: *Deleted

## 2019-11-11 DIAGNOSIS — Z8679 Personal history of other diseases of the circulatory system: Secondary | ICD-10-CM

## 2019-11-11 DIAGNOSIS — I10 Essential (primary) hypertension: Secondary | ICD-10-CM

## 2019-11-11 DIAGNOSIS — I2510 Atherosclerotic heart disease of native coronary artery without angina pectoris: Secondary | ICD-10-CM

## 2019-11-11 DIAGNOSIS — G3184 Mild cognitive impairment, so stated: Secondary | ICD-10-CM

## 2019-11-11 NOTE — Telephone Encounter (Signed)
From: Jill Alexanders [facility])  Sent: Monday, November 11, 2019 3:06 PM To: Lincoln Maxin [email or web address]> Cc: Janci Minor ([email or web address]) [email or web address]> Subject: SECURE: Lealand Elting. [place] [employer] Referral 11/11/19 - [place], Please reach out to Patients daughter, Mr. Dugger memory is in decline. Thank you!  Patient Name:                                  Bell Cai. [facility] Address:                                           [address] [place],  [address] Phone Number:                                [phone] - Please Contact Daughter Eartha Inch [phone] we have Designated Party release form from 08/17/17  Last 4 ssn:                                       [id number]                      Date of Birth:                                    1929-09-17                          Emergency Contact:                        Daryll Drown [phone]                          Marital Status:                                   Widowed                               Household Size:                               1  Race:                                               Caucasian Language Spoken:                           English  What are you doing for meals right now? Any type of paid help in the home? Health Concerns: Diabetic?  How many prescription medications a day?  Length of Meal Delivery:               60 days please add to [facility] wait list Preferred Start Date:                      ASAP  Thank you! [place] [facility] ??[email or web address]   ??[phone]
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
## 2019-11-28 ENCOUNTER — Telehealth: Payer: Self-pay

## 2019-12-05 ENCOUNTER — Telehealth: Payer: Self-pay

## 2019-12-09 ENCOUNTER — Ambulatory Visit: Payer: Self-pay | Admitting: *Deleted

## 2019-12-09 ENCOUNTER — Telehealth: Payer: Self-pay

## 2019-12-09 NOTE — Chronic Care Management (AMB) (Signed)
  Chronic Care Management   Outreach Note  12/09/2019 Name: Jake Garcia MRN: [id number] DOB: Aug 25, 1929  Referred by: [name], NP (Inactive) Reason for referral : Chronic Care Management (Unsuccesful Outreach )   An unsuccessful telephone outreach was attempted today. The patient was referred to the case management team by for assistance with care management and care coordination.    Follow Up Plan: A HIPPA compliant phone message was left for the patient providing contact information and requesting a return call.  The care management team will reach out to the patient again over the next 60 days.   Merlene Morse Malavika Lira RN, BSN Nurse Case Pharmacist, [facility]  [phone]) [employer]

## 2019-12-25 ENCOUNTER — Telehealth: Payer: Self-pay

## 2019-12-25 ENCOUNTER — Ambulatory Visit: Payer: Self-pay | Admitting: Pharmacist

## 2019-12-25 NOTE — Chronic Care Management (AMB) (Signed)
  Chronic Care Management   Follow Up Note   12/25/2019 Name: Jake Garcia MRN: [id number] DOB: [birth year]  Referred by: [name], NP (Inactive) Reason for referral : Chronic Care Management (Caregiver Phone Call)   Jake Garcia is a 86 y.o. year old male who is a primary care patient of [name], NP (Inactive). The CCM team was consulted for assistance with chronic disease management and care coordination needs.    Outreach call to Garment/textile technologist, [name]. Note daughter listed on patient's designated party release in chart.   Was unable to reach caregiver via telephone today and have left HIPAA compliant voicemail asking for return call.   Plan  The care management team will reach out to the patient again over the next 30 days.   [name], PharmD, [place] [employer] [phone]

## 2020-01-09 ENCOUNTER — Telehealth: Payer: Self-pay

## 2020-01-21 ENCOUNTER — Telehealth: Payer: Self-pay

## 2020-01-23 ENCOUNTER — Telehealth: Payer: Self-pay

## 2020-01-27 ENCOUNTER — Telehealth: Payer: Self-pay

## 2020-01-27 ENCOUNTER — Ambulatory Visit: Payer: Self-pay | Admitting: Pharmacist

## 2020-01-27 DIAGNOSIS — I10 Essential (primary) hypertension: Secondary | ICD-10-CM

## 2020-01-27 NOTE — Chronic Care Management (AMB) (Signed)
  Chronic Care Management   Follow Up Note   01/27/2020 Name: Jake Garcia MRN: [id number] DOB: 19-Nov-1929  Referred by: Jake College, NP (Inactive) Reason for referral : Chronic Care Management (Patient/Caregiver Phone Call)   Jake Garcia is a 86 y.o. year old male who is a primary care patient of Jake College, NP (Inactive). The CCM team was consulted for assistance with chronic disease management and care coordination needs.    I reached out to Jake Garcia by phone today, but patient's phone number in chart is no longer valid.  Outreach call to Jake Garcia, Jake Garcia. Note daughter listed on patient's designated party release in chart.   Review of patient status, including review of consultants reports, relevant laboratory and other test results, and collaboration with appropriate care team members and the patient's provider was performed as part of comprehensive patient evaluation and provision of chronic care management services.     Outpatient Encounter Medications as of 01/27/2020  Medication Sig  . amLODipine-olmesartan (AZOR) 10-40 MG tablet Take 1 tablet by mouth daily. (Patient taking differently: Take 1 tablet by mouth daily. )  . aspirin EC 81 MG tablet Take 1 tablet (81 mg total) by mouth daily.  [name] escitalopram (LEXAPRO) 5 MG tablet Take 1 tablet (5 mg total) by mouth daily.  . ferrous sulfate 325 (65 FE) MG tablet Take 1 tablet (325 mg total) by mouth daily.  . multivitamin-lutein (OCUVITE-LUTEIN) CAPS capsule Take 1 capsule by mouth daily.  . rosuvastatin (CRESTOR) 10 MG tablet Take 1 tablet (10 mg total) by mouth daily.  [name] senna (SENOKOT) 8.6 MG TABS tablet Take 1 tablet by mouth daily as needed for mild constipation.  . tamsulosin (FLOMAX) 0.4 MG CAPS capsule Take 0.4 mg by mouth daily after supper.    No facility-administered encounter medications on file as of 01/27/2020.    Goals Addressed            This Visit's Progress   .  PharmD - Medication Review       Current Barriers:  . Non Adherence to prescribed medication regimen . Cognitive deficits  Pharmacist Clinical Goal(s):  [name] Over the next 30 days, patient will work with CM Pharmacist to address needs related to medication review and medication adherence  Interventions: . Unable to reach patient on phone number listed in chart as is no longer valid . Follow up call to patient's daughter, Jake Garcia. o Reports patient has gone to stay with his son Jake Garcia in [place], [place] for now, but thinks that he will come back again in a couple of weeks. o Reports patient has received 2 doses of the [place] COVID-19 vaccination o Advises that patient's phone number has been changed; new number: [phone] (will update in chart) . Denies any current medication question/concerns on behalf of patient at this time.  Patient Self Care Activities:  . Patient attends medical appointments as scheduled with assistance of family   Please see past updates related to this goal by clicking on the "Past Updates" button in the selected goal         Plan  The care management team will reach out to the patient again over the next 30 days.   [name], PharmD, [place] [employer] [phone]

## 2020-01-27 NOTE — Patient Instructions (Signed)
Thank you allowing the Chronic Care Management Team to be a part of your care! It was a pleasure speaking with you today!     CCM (Chronic Care Management) Team    Noreene Larsson RN, MSN, CCM Nurse Care Coordinator  [phone]   [name] PharmD  Clinical Pharmacist  [phone]   Eula Fried LCSW Clinical Social Worker [phone]  Visit Information  Goals Addressed            This Visit's Progress   . PharmD - Medication Review       Current Barriers:  . Non Adherence to prescribed medication regimen . Cognitive deficits  Pharmacist Clinical Goal(s):  [name] Over the next 30 days, patient will work with CM Pharmacist to address needs related to medication review and medication adherence  Interventions: . Unable to reach patient on phone number listed in chart as is no longer valid . Follow up call to patient's daughter, Jake Garcia. o Reports patient has gone to stay with his son Jake Garcia in [place], [place] for now, but thinks that he will come back again in a couple of weeks. o Reports patient has received 2 doses of the [place] COVID-19 vaccination o Advises that patient's phone number has been changed; new number: [phone] (will update in chart) . Denies any current medication question/concerns on behalf of patient at this time.  Patient Self Care Activities:  . Patient attends medical appointments as scheduled with assistance of family   Please see past updates related to this goal by clicking on the "Past Updates" button in the selected goal         The patient verbalized understanding of instructions provided today and declined a print copy of patient instruction materials.   The care management team will reach out to the patient again over the next 30 days.   [name], PharmD, [facility] [employer] [phone]

## 2020-01-30 ENCOUNTER — Telehealth: Payer: Self-pay

## 2020-02-20 ENCOUNTER — Ambulatory Visit (INDEPENDENT_AMBULATORY_CARE_PROVIDER_SITE_OTHER): Payer: Medicare Other | Admitting: General Practice

## 2020-02-20 ENCOUNTER — Telehealth: Payer: Self-pay | Admitting: General Practice

## 2020-02-20 ENCOUNTER — Telehealth: Payer: Self-pay

## 2020-02-20 DIAGNOSIS — I10 Essential (primary) hypertension: Secondary | ICD-10-CM

## 2020-02-20 DIAGNOSIS — E782 Mixed hyperlipidemia: Secondary | ICD-10-CM

## 2020-02-20 DIAGNOSIS — R413 Other amnesia: Secondary | ICD-10-CM

## 2020-02-20 DIAGNOSIS — I2510 Atherosclerotic heart disease of native coronary artery without angina pectoris: Secondary | ICD-10-CM

## 2020-02-20 DIAGNOSIS — G3184 Mild cognitive impairment, so stated: Secondary | ICD-10-CM

## 2020-02-20 DIAGNOSIS — N186 End stage renal disease: Secondary | ICD-10-CM

## 2020-02-20 NOTE — Telephone Encounter (Signed)
I attempted to contact the pt, no answer. VM not set-up. I will try him back later.
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
## 2020-02-20 NOTE — Telephone Encounter (Signed)
-----   Message from Verl Bangs, FNP sent at 02/20/2020  4:00 PM EST ----- Regarding: Upcoming appointment? Hi all,     Can Cahlil be scheduled for a follow up appointment?  I had gotten a message from [facility] with CCM that we have some things we need to address in office. Thanks so much
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
## 2020-02-24 NOTE — Telephone Encounter (Signed)
I spoke with [name] and explained to him that we need to get him in the office for a f/u appt. He informed me that I need to contac this daughter [name] to schedule the appt. I contact [name] and left a detail message to give [place] a call back.

## 2020-03-02 ENCOUNTER — Ambulatory Visit
Admission: RE | Admit: 2020-03-02 | Discharge: 2020-03-02 | Disposition: A | Discharge status: Home / Self Care | Payer: Medicare Other | Source: Ambulatory Visit | Attending: Nephrology | Admitting: Nephrology

## 2020-03-02 DIAGNOSIS — D631 Anemia in chronic kidney disease: Secondary | ICD-10-CM | POA: Diagnosis not present

## 2020-03-03 ENCOUNTER — Ambulatory Visit
Admission: RE | Admit: 2020-03-03 | Discharge: 2020-03-03 | Disposition: A | Discharge status: Home / Self Care | Payer: Medicare Other | Source: Ambulatory Visit | Attending: Nephrology | Admitting: Nephrology

## 2020-03-03 ENCOUNTER — Ambulatory Visit: Payer: Medicare Other

## 2020-03-03 ENCOUNTER — Other Ambulatory Visit: Payer: Self-pay

## 2020-03-03 ENCOUNTER — Ambulatory Visit: Payer: Self-pay | Admitting: Licensed Clinical Social Worker

## 2020-03-03 DIAGNOSIS — I10 Essential (primary) hypertension: Secondary | ICD-10-CM | POA: Diagnosis not present

## 2020-03-03 DIAGNOSIS — N186 End stage renal disease: Secondary | ICD-10-CM

## 2020-03-03 DIAGNOSIS — D631 Anemia in chronic kidney disease: Secondary | ICD-10-CM | POA: Insufficient documentation

## 2020-03-03 DIAGNOSIS — N185 Chronic kidney disease, stage 5: Secondary | ICD-10-CM | POA: Diagnosis not present

## 2020-03-03 DIAGNOSIS — E782 Mixed hyperlipidemia: Secondary | ICD-10-CM

## 2020-03-03 DIAGNOSIS — F329 Major depressive disorder, single episode, unspecified: Secondary | ICD-10-CM

## 2020-03-03 DIAGNOSIS — F32A Depression, unspecified: Secondary | ICD-10-CM

## 2020-03-03 DIAGNOSIS — I2510 Atherosclerotic heart disease of native coronary artery without angina pectoris: Secondary | ICD-10-CM

## 2020-03-03 DIAGNOSIS — G3184 Mild cognitive impairment, so stated: Secondary | ICD-10-CM

## 2020-03-03 LAB — ABO/RH: ABO/RH(D): A POS

## 2020-03-03 LAB — PREPARE RBC (CROSSMATCH)

## 2020-03-03 NOTE — Telephone Encounter (Signed)
-----   Message from Verl Bangs, FNP sent at 03/03/2020 10:01 AM EDT ----- Can we have him schedule a visit?  Thanks ----- Message ----- From: Greg Cutter, Marlinda Mike Sent: 03/03/2020   9:43 AM EDT To: Verl Bangs, FNP  Pt has ran out of his antidepressants refills and was encouraged to contact the office to set up an appt with you!  Thank you
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
## 2020-03-03 NOTE — Telephone Encounter (Signed)
I called and spoke with [name] the patient daughter and she informed me that she was under the impression that her father changed PCP's after Lauren his previous provider left the practice. She said she will contact her brother [name] to confirm and give [place] a call back.

## 2020-03-04 ENCOUNTER — Other Ambulatory Visit: Payer: Self-pay

## 2020-03-04 LAB — BPAM RBC
Blood Product Expiration Date: 202104162359
Blood Product Expiration Date: 202104162359
ISSUE DATE / TIME: 202103161522
Unit Type and Rh: 6200
Unit Type and Rh: 6200

## 2020-03-04 LAB — TYPE AND SCREEN
ABO/RH(D): A POS
Antibody Screen: POSITIVE
Unit division: 0
Unit division: 0

## 2020-03-06 NOTE — Telephone Encounter (Signed)
I called back again and spoke with Jake Garcia to f/u on getting an appointment scheduled for him to come in to be seen. He informed me that [name] was at work and that he will give her the message and have her to give [place] a call back.

## 2020-03-30 ENCOUNTER — Telehealth: Payer: Self-pay

## 2020-03-30 ENCOUNTER — Ambulatory Visit: Payer: Self-pay | Admitting: Pharmacist

## 2020-03-30 DIAGNOSIS — N186 End stage renal disease: Secondary | ICD-10-CM

## 2020-03-30 DIAGNOSIS — I10 Essential (primary) hypertension: Secondary | ICD-10-CM

## 2020-03-30 DIAGNOSIS — I2510 Atherosclerotic heart disease of native coronary artery without angina pectoris: Secondary | ICD-10-CM

## 2020-03-30 NOTE — Patient Instructions (Signed)
Thank you allowing the Chronic Care Management Team to be a part of your care! It was a pleasure speaking with you today!     CCM (Chronic Care Management) Team    Noreene Larsson RN, MSN, CCM Nurse Care Coordinator  [phone]   [name] PharmD  Clinical Pharmacist  [phone]   Eula Fried LCSW Clinical Social Worker [phone]  Visit Information  Goals Addressed            This Visit's Progress   . PharmD - Medication Review       Current Barriers:  . Non Adherence to prescribed medication regimen . Cognitive deficits . Lack of BP results for clinical team  Pharmacist Clinical Goal(s):  [name] Over the next 30 days, patient will work with CM Pharmacist to address needs related to medication review and medication adherence  Interventions: . Discuss importance of BP control with patient's son Dr. Sharion Balloon o Reports patient taking amlodipine-olmesartan 10-40 mg once daily o Reports patient has preferred to have BP run higher, up to ~160/80.  o States patient has refused to take additional BP medication  o Notes patient due to be seen by Cardiology . Reports difficulty with helping manage patient's medical care as he lives in [place].  o Reports currently focused working to coordinate for patient start dialysis . Coordination of care. Note office has made multiple attempts to reach patient for scheduling of appointment with new PCP, Cyndia Skeeters.  o Son states that he will reach out to patient's other son, [name], for him to schedule PCP appointment as [name] lives near patient and would transport him to the appointment.  Patient Self Care Activities:  . Patient attends medical appointments as scheduled with assistance of family   Please see past updates related to this goal by clicking on the "Past Updates" button in the selected goal         Caregiver verbalizes understanding of instructions provided today.   The care management team will reach out to the  patient again over the next 30 days.   [name], PharmD, [place] [employer] [phone]

## 2020-03-30 NOTE — Chronic Care Management (AMB) (Signed)
  Chronic Care Management   Follow Up Note   03/30/2020 Name: SILVESTRE MINES MRN: [id number] DOB: 15-May-1929  Referred by: Verl Bangs, FNP Reason for referral : Chronic Care Management (Caregiver Phone Call)   DEMBA NIGH is a 86 y.o. year old male who is a primary care patient of Verl Bangs, FNP. The CCM team was consulted for assistance with chronic disease management and care coordination needs.    Perform chart review Per 3/16 note from [place] patient's son [name] prefers that ALL future CCM calls be made to his brother [name] at [phone].  Was unable to reach son [name] via telephone today and have left HIPAA compliant voicemail asking for return call.    Plan  The care management team will reach out to the patient again over the next 30 days.   [name], PharmD, [place] [employer] [phone]

## 2020-03-30 NOTE — Chronic Care Management (AMB) (Signed)
Chronic Care Management   Follow Up Note   03/30/2020 Name: Jake Garcia MRN: [id number] DOB: 1929-05-08  Referred by: Verl Bangs, FNP Reason for referral : Chronic Care Management (Caregiver Phone Call)   Jake Garcia is a 86 y.o. year old male who is a primary care patient of Verl Bangs, FNP. The CCM team was consulted for assistance with chronic disease management and care coordination needs.    Per 3/16 note from [place] patient's son [name] prefers that ALL future CCM calls be made to his brother [name] at [phone].  Receive call back from patient's son Trebor Galdamez.  Review of patient status, including review of consultants reports, relevant laboratory and other test results, and collaboration with appropriate care team members and the patient's provider was performed as part of comprehensive patient evaluation and provision of chronic care management services.     Outpatient Encounter Medications as of 03/30/2020  Medication Sig  . amLODipine-olmesartan (AZOR) 10-40 MG tablet Take 1 tablet by mouth daily. (Patient taking differently: Take 1 tablet by mouth daily. )  . aspirin EC 81 MG tablet Take 1 tablet (81 mg total) by mouth daily.  [name] escitalopram (LEXAPRO) 5 MG tablet Take 1 tablet (5 mg total) by mouth daily.  . ferrous sulfate 325 (65 FE) MG tablet Take 1 tablet (325 mg total) by mouth daily.  . multivitamin-lutein (OCUVITE-LUTEIN) CAPS capsule Take 1 capsule by mouth daily.  . rosuvastatin (CRESTOR) 10 MG tablet Take 1 tablet (10 mg total) by mouth daily.  [name] senna (SENOKOT) 8.6 MG TABS tablet Take 1 tablet by mouth daily as needed for mild constipation.  . tamsulosin (FLOMAX) 0.4 MG CAPS capsule Take 0.4 mg by mouth daily after supper.    No facility-administered encounter medications on file as of 03/30/2020.    Goals Addressed            This Visit's Progress   . PharmD - Medication Review       Current Barriers:  . Non Adherence to  prescribed medication regimen . Cognitive deficits . Lack of BP results for clinical team  Pharmacist Clinical Goal(s):  [name] Over the next 30 days, patient will work with CM Pharmacist to address needs related to medication review and medication adherence  Interventions: . Discuss importance of BP control with patient's son Dr. Sharion Balloon o Reports patient taking amlodipine-olmesartan 10-40 mg once daily o Reports patient has preferred to have BP run higher, up to ~160/80.  o States patient has refused to take additional BP medication  o Notes patient due to be seen by Cardiology . Reports difficulty with helping manage patient's medical care as he lives in [place].  o Reports currently focused working to coordinate for patient start dialysis . Coordination of care. Note office has made multiple attempts to reach patient for scheduling of appointment with new PCP, Cyndia Skeeters.  o Son states that he will reach out to patient's other son, [name], for him to schedule PCP appointment as [name] lives near patient and would transport him to the appointment.  Patient Self Care Activities:  . Patient attends medical appointments as scheduled with assistance of family   Please see past updates related to this goal by clicking on the "Past Updates" button in the selected goal         Plan  The care management team will reach out to the patient again over the next 30 days.   [name], PharmD,  [place] [employer] [phone]

## 2020-03-31 ENCOUNTER — Telehealth: Payer: Self-pay

## 2020-04-13 ENCOUNTER — Ambulatory Visit (INDEPENDENT_AMBULATORY_CARE_PROVIDER_SITE_OTHER): Payer: Medicare Other | Admitting: General Practice

## 2020-04-13 DIAGNOSIS — I10 Essential (primary) hypertension: Secondary | ICD-10-CM | POA: Diagnosis not present

## 2020-04-13 DIAGNOSIS — F329 Major depressive disorder, single episode, unspecified: Secondary | ICD-10-CM | POA: Diagnosis not present

## 2020-04-13 DIAGNOSIS — W19XXXA Unspecified fall, initial encounter: Secondary | ICD-10-CM

## 2020-04-13 DIAGNOSIS — N186 End stage renal disease: Secondary | ICD-10-CM | POA: Diagnosis not present

## 2020-04-13 DIAGNOSIS — F32A Depression, unspecified: Secondary | ICD-10-CM

## 2020-04-13 DIAGNOSIS — I2510 Atherosclerotic heart disease of native coronary artery without angina pectoris: Secondary | ICD-10-CM

## 2020-04-13 NOTE — Patient Instructions (Signed)
Visit Information  Goals Addressed            This Visit's Progress   . RNCM: Pt's daughter Ralph Leyden "dad is in the hospital and I needed to let you know" (pt-stated)       CARE PLAN ENTRY (see longitudinal plan of care for additional care plan information)  Current Barriers:  [name] Knowledge Deficits related to fall precautions . Decreased adherence to prescribed treatment for fall prevention . Lacks caregiver support.  . Transportation barriers . Strained family relationships  Clinical Goal(s):  [name] Over the next 120 days, patient will demonstrate improved adherence to prescribed treatment plan for decreasing falls as evidenced by patient reporting and review of EMR . Over the next 120 days, patient will verbalize using fall risk reduction strategies discussed . Over the next 120 days, patient will not experience additional falls . Over the next 120 days, patient will work with [facility], [place] team, and pcp to address needs related to patients needs once is is discharged from the hospital and returns to [facility].   Interventions:  . Provided written and verbal education re: Potential causes of falls and Fall prevention strategies . Reviewed medications and discussed potential side effects of medications such as dizziness and frequent urination . Assessed for s/s of orthostatic hypotension . Assessed for falls since last encounter. . Assessed patients knowledge of fall risk prevention secondary to previously provided education. . Assessed working status of life alert bracelet and patient adherence . Provided patient information for fall alert systems . Evaluation of current treatment plan related to fall and related fractures  and patient's adherence to plan as established by provider. Nash Dimmer with CCM team and pcp regarding patients fall with pelvic and sacral fracture and  currently being inpatient in [facility]  Patient Self Care Activities:  . Gwynneth Aliment (assistive device) appropriately  with all ambulation and physical therapy . De-clutter walkways . Change positions slowly . Wear secure fitting shoes at all times with ambulation . Utilize home lighting for dim lit areas . Have self and pet awareness at all times  Plan: . CCM RN CM will follow up in when the patient returns to [facility]   Initial goal documentation        Patient verbalizes understanding of instructions provided today.   The care management team will reach out to the patient again over the next 30 to 60 days.   Noreene Larsson RN, MSN, [place] Mobile: [phone]

## 2020-04-13 NOTE — Chronic Care Management (AMB) (Signed)
Chronic Care Management   Follow Up Note   04/13/2020 Name: Jake Garcia MRN: [id number] DOB: 10-13-1929  Referred by: Jake Bangs, FNP Reason for referral : Chronic Care Management (RNCM Chronic Disease Management and Care Coordination Needs)   Jake Garcia is a 86 y.o. year old male who is a primary care patient of Jake Bangs, FNP. The CCM team was consulted for assistance with chronic disease management and care coordination needs.  The patients daughter Jake Garcia called and left a VM asking for a call back. The patients daughter called and the daughter verbalized her brother Jake Garcia took the patient to [facility] and while in [facility] the patient had a fall and is currently in the hospital. The patients daughter wanted to let the [facility] know that he was in the hospital and he wants to come back home to [place] [facility] [place].   Review of patient status, including review of consultants reports, relevant laboratory and other test results, and collaboration with appropriate care team members and the patient's provider was performed as part of comprehensive patient evaluation and provision of chronic care management services.    SDOH (Social Determinants of Health) assessments performed: No See Care Plan activities for detailed interventions related to [facility])     Outpatient Encounter Medications as of 04/13/2020  Medication Sig  . amLODipine-olmesartan (AZOR) 10-40 MG tablet Take 1 tablet by mouth daily. (Patient taking differently: Take 1 tablet by mouth daily. )  . aspirin EC 81 MG tablet Take 1 tablet (81 mg total) by mouth daily.  [name] escitalopram (LEXAPRO) 5 MG tablet Take 1 tablet (5 mg total) by mouth daily.  . ferrous sulfate 325 (65 FE) MG tablet Take 1 tablet (325 mg total) by mouth daily.  . multivitamin-lutein (OCUVITE-LUTEIN) CAPS capsule Take 1 capsule by mouth daily.  . rosuvastatin (CRESTOR) 10 MG tablet Take 1 tablet (10 mg total) by mouth daily.  [name] senna (SENOKOT) 8.6 MG TABS tablet Take 1 tablet by  mouth daily as needed for mild constipation.  . tamsulosin (FLOMAX) 0.4 MG CAPS capsule Take 0.4 mg by mouth daily after supper.    No facility-administered encounter medications on file as of 04/13/2020.     Objective:   Goals Addressed            This Visit's Progress   . RNCM: Pt's daughter Jake Garcia "dad is in the hospital and I needed to let you know" (pt-stated)       CARE PLAN ENTRY (see longitudinal plan of care for additional care plan information)  Current Barriers:  [name] Knowledge Deficits related to fall precautions . Decreased adherence to prescribed treatment for fall prevention . Lacks caregiver support.  . Transportation barriers . Strained family relationships  Clinical Goal(s):  [name] Over the next 120 days, patient will demonstrate improved adherence to prescribed treatment plan for decreasing falls as evidenced by patient reporting and review of EMR . Over the next 120 days, patient will verbalize using fall risk reduction strategies discussed . Over the next 120 days, patient will not experience additional falls . Over the next 120 days, patient will work with [facility], [place] team, and pcp to address needs related to patients needs once is is discharged from the hospital and returns to [facility].   Interventions:  . Provided written and verbal education re: Potential causes of falls and Fall prevention strategies . Reviewed medications and discussed potential side effects of medications such as dizziness and frequent urination . Assessed for s/s  of orthostatic hypotension . Assessed for falls since last encounter. . Assessed patients knowledge of fall risk prevention secondary to previously provided education. . Assessed working status of life alert bracelet and patient adherence . Provided patient information for fall alert systems . Evaluation of current treatment plan related to fall and related fractures  and patient's adherence to plan as established by  provider. Jake Garcia with CCM team and pcp regarding patients fall with pelvic and sacral fracture and  currently being inpatient in [facility]  Patient Self Care Activities:  . Gwynneth Aliment (assistive device) appropriately with all ambulation and physical therapy . De-clutter walkways . Change positions slowly . Wear secure fitting shoes at all times with ambulation . Utilize home lighting for dim lit areas . Have self and pet awareness at all times  Plan: . CCM RN CM will follow up in when the patient returns to [facility]   Initial goal documentation         Plan:   The care management team will reach out to the patient again over the next 30 to 60  days.    Jake Larsson RN, MSN, [place] Mobile: [phone]

## 2020-04-16 ENCOUNTER — Telehealth: Payer: Self-pay | Admitting: Family Medicine

## 2020-04-16 ENCOUNTER — Telehealth: Payer: Self-pay

## 2020-04-16 NOTE — Telephone Encounter (Signed)
Copied from [place] [id number]. Topic: General - Other >> Apr 16, 2020 12:13 PM Keene Breath wrote: Reason for CRM: Patient is a former patient of Dr. Merrilyn Puma.  His daughter n Sports coach. Titus Drone [phone]),  is calling to get a PA from his current PCP.  He is out of town visiting with her and needs this information for [employer].  Please call to discuss.

## 2020-04-16 NOTE — Telephone Encounter (Signed)
I called back and spoke with the patient son Jake Garcia) about his concern for post hospital discharge into [place]. He informed me that the patient is currently in the hospital in [place] for a Non displaced pubic and sacrum fracture from a recent fall. The pt recently relocated to [place] with his son. I informed the patient son that the hospital should initiate his father admission into Rehab before his discharge. I also informed him that he need to go ahead and arrange getting his father another PCP in [place] so they can continue managing his care. He verbalize understanding, no questions or concerns.

## 2020-04-16 NOTE — Telephone Encounter (Signed)
[phone] Jake Garcia (son)/ daughter in  [place] wanted me to add this number and would like to call back to be to the son.   Just fyi

## 2020-04-27 ENCOUNTER — Telehealth: Payer: Self-pay

## 2020-04-27 NOTE — Telephone Encounter (Signed)
Copied from [place] [id number]. Topic: General - Inquiry >> Apr 27, 2020 11:07 AM Scherrie Gerlach wrote: Reason for CRM: son states pt is in a skilled facility recouping from a fall. However, he would like to know if the dr could pin oint a time when onset of dementia started

## 2020-04-27 NOTE — Telephone Encounter (Signed)
Chart references approximately 1 year ago seeing issue with short term memory loss.  I would be unable to say when his symptoms started since I did not see him as his provider and his previous provider is no longer with the practice.

## 2020-04-27 NOTE — Telephone Encounter (Signed)
The pt son Jake Garcia was notified of approximate date for seeing issue with short term memory loss. He verbalize understanding.

## 2020-04-30 ENCOUNTER — Ambulatory Visit (INDEPENDENT_AMBULATORY_CARE_PROVIDER_SITE_OTHER): Payer: Medicare Other | Admitting: Licensed Clinical Social Worker

## 2020-04-30 DIAGNOSIS — F32A Depression, unspecified: Secondary | ICD-10-CM

## 2020-04-30 DIAGNOSIS — I2510 Atherosclerotic heart disease of native coronary artery without angina pectoris: Secondary | ICD-10-CM | POA: Diagnosis not present

## 2020-04-30 DIAGNOSIS — E782 Mixed hyperlipidemia: Secondary | ICD-10-CM | POA: Diagnosis not present

## 2020-04-30 DIAGNOSIS — G3184 Mild cognitive impairment, so stated: Secondary | ICD-10-CM

## 2020-04-30 DIAGNOSIS — I10 Essential (primary) hypertension: Secondary | ICD-10-CM

## 2020-04-30 DIAGNOSIS — N186 End stage renal disease: Secondary | ICD-10-CM | POA: Diagnosis not present

## 2020-04-30 DIAGNOSIS — F329 Major depressive disorder, single episode, unspecified: Secondary | ICD-10-CM
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
## 2020-05-11 ENCOUNTER — Ambulatory Visit: Payer: Self-pay | Admitting: Licensed Clinical Social Worker

## 2020-05-11 ENCOUNTER — Telehealth: Payer: Self-pay | Admitting: General Practice

## 2020-05-11 ENCOUNTER — Ambulatory Visit: Payer: Self-pay | Admitting: General Practice

## 2020-05-11 DIAGNOSIS — I2510 Atherosclerotic heart disease of native coronary artery without angina pectoris: Secondary | ICD-10-CM

## 2020-05-11 DIAGNOSIS — F321 Major depressive disorder, single episode, moderate: Secondary | ICD-10-CM

## 2020-05-11 DIAGNOSIS — F32A Depression, unspecified: Secondary | ICD-10-CM

## 2020-05-11 DIAGNOSIS — I10 Essential (primary) hypertension: Secondary | ICD-10-CM

## 2020-05-11 DIAGNOSIS — N186 End stage renal disease: Secondary | ICD-10-CM

## 2020-05-11 DIAGNOSIS — E782 Mixed hyperlipidemia: Secondary | ICD-10-CM

## 2020-05-11 DIAGNOSIS — W19XXXA Unspecified fall, initial encounter: Secondary | ICD-10-CM

## 2020-05-11 DIAGNOSIS — R413 Other amnesia: Secondary | ICD-10-CM
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
## 2020-05-11 NOTE — Chronic Care Management (AMB) (Signed)
  Care Management  Care Coordination Note   05/11/2020 Name: [name] MRN: [id number] DOB: [birth year]  Referred by: Verl Bangs, FNP Reason for referral : Care Coordination   Jake Garcia is a 86 y.o. year old male who is a primary care patient of Verl Bangs, FNP. The care management team was consulted for assistance with care management and care coordination needs.    Review of patient status, including review of consultants reports, relevant laboratory and other test results, and collaboration with appropriate care team members and the patient's provider was performed as part of comprehensive patient evaluation and provision of chronic care management services.    LCSW completed care coordination with CCM supervisor and CCM RNCM. LCSW was informed that patient is temporarily relocated in [facility] and has a new PCP. However, family is hopeful that can eventually relocate back to [facility] once he is able to. CCM services will be suspended for now until patient's return back to [place].  Eula Fried, BSW, MSW, [email or web address] Phone: [phone]
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
## 2020-05-13 ENCOUNTER — Telehealth: Payer: Self-pay

## 2020-07-30 ENCOUNTER — Telehealth: Payer: Self-pay

## 2020-10-19 DEATH — deceased
